# Patient Record
Sex: Female | Born: 2000 | Race: White | Hispanic: Yes | Smoking: Never smoker
Health system: Southern US, Community
[De-identification: ages and names within clinical notes are randomized; demographics above are authoritative.]

## PROBLEM LIST (undated history)

## (undated) ENCOUNTER — Inpatient Hospital Stay (HOSPITAL_COMMUNITY): Payer: Self-pay

## (undated) DIAGNOSIS — O26619 Liver and biliary tract disorders in pregnancy, unspecified trimester: Secondary | ICD-10-CM

## (undated) DIAGNOSIS — G43909 Migraine, unspecified, not intractable, without status migrainosus: Secondary | ICD-10-CM

## (undated) DIAGNOSIS — K831 Obstruction of bile duct: Secondary | ICD-10-CM

## (undated) DIAGNOSIS — Z789 Other specified health status: Secondary | ICD-10-CM

## (undated) DIAGNOSIS — O26649 Intrahepatic cholestasis of pregnancy, unspecified trimester: Secondary | ICD-10-CM

## (undated) DIAGNOSIS — N390 Urinary tract infection, site not specified: Secondary | ICD-10-CM

## (undated) HISTORY — PX: WISDOM TOOTH EXTRACTION: SHX21

## (undated) HISTORY — DX: Obstruction of bile duct: K83.1

## (undated) HISTORY — PX: NO PAST SURGERIES: SHX2092

## (undated) HISTORY — DX: Intrahepatic cholestasis of pregnancy, unspecified trimester: O26.649

## (undated) HISTORY — DX: Obstruction of bile duct: O26.619

---
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
## 2021-05-16 ENCOUNTER — Inpatient Hospital Stay (HOSPITAL_COMMUNITY)
Admission: AD | Admit: 2021-05-16 | Discharge: 2021-05-16 | Disposition: A | Discharge status: Home / Self Care | Payer: Medicaid Other | Attending: Obstetrics and Gynecology | Admitting: Obstetrics and Gynecology

## 2021-05-16 ENCOUNTER — Inpatient Hospital Stay (HOSPITAL_COMMUNITY): Payer: Medicaid Other

## 2021-05-16 ENCOUNTER — Other Ambulatory Visit: Payer: Self-pay

## 2021-05-16 ENCOUNTER — Encounter (HOSPITAL_COMMUNITY): Payer: Self-pay | Admitting: Obstetrics and Gynecology

## 2021-05-16 DIAGNOSIS — R109 Unspecified abdominal pain: Secondary | ICD-10-CM | POA: Diagnosis not present

## 2021-05-16 DIAGNOSIS — O26899 Other specified pregnancy related conditions, unspecified trimester: Secondary | ICD-10-CM | POA: Diagnosis not present

## 2021-05-16 DIAGNOSIS — R1012 Left upper quadrant pain: Secondary | ICD-10-CM | POA: Diagnosis not present

## 2021-05-16 DIAGNOSIS — Z3A01 Less than 8 weeks gestation of pregnancy: Secondary | ICD-10-CM | POA: Insufficient documentation

## 2021-05-16 DIAGNOSIS — O26891 Other specified pregnancy related conditions, first trimester: Secondary | ICD-10-CM | POA: Insufficient documentation

## 2021-05-16 DIAGNOSIS — R1011 Right upper quadrant pain: Secondary | ICD-10-CM | POA: Insufficient documentation

## 2021-05-16 DIAGNOSIS — N898 Other specified noninflammatory disorders of vagina: Secondary | ICD-10-CM | POA: Insufficient documentation

## 2021-05-16 LAB — CBC WITH DIFFERENTIAL/PLATELET
Abs Immature Granulocytes: 0.03 10*3/uL (ref 0.00–0.07)
Basophils Absolute: 0 10*3/uL (ref 0.0–0.1)
Basophils Relative: 0 %
Eosinophils Absolute: 0.1 10*3/uL (ref 0.0–0.5)
Eosinophils Relative: 1 %
HCT: 34 % — ABNORMAL LOW (ref 36.0–46.0)
Hemoglobin: 11.5 g/dL — ABNORMAL LOW (ref 12.0–15.0)
Immature Granulocytes: 0 %
Lymphocytes Relative: 24 %
Lymphs Abs: 2.2 10*3/uL (ref 0.7–4.0)
MCH: 30.2 pg (ref 26.0–34.0)
MCHC: 33.8 g/dL (ref 30.0–36.0)
MCV: 89.2 fL (ref 80.0–100.0)
Monocytes Absolute: 0.7 10*3/uL (ref 0.1–1.0)
Monocytes Relative: 7 %
Neutro Abs: 6.3 10*3/uL (ref 1.7–7.7)
Neutrophils Relative %: 68 %
Platelets: 254 10*3/uL (ref 150–400)
RBC: 3.81 MIL/uL — ABNORMAL LOW (ref 3.87–5.11)
RDW: 12.9 % (ref 11.5–15.5)
WBC: 9.3 10*3/uL (ref 4.0–10.5)
nRBC: 0 % (ref 0.0–0.2)

## 2021-05-16 LAB — URINALYSIS, ROUTINE W REFLEX MICROSCOPIC
Bilirubin Urine: NEGATIVE
Glucose, UA: NEGATIVE mg/dL
Hgb urine dipstick: NEGATIVE
Ketones, ur: NEGATIVE mg/dL
Leukocytes,Ua: NEGATIVE
Nitrite: NEGATIVE
Protein, ur: NEGATIVE mg/dL
Specific Gravity, Urine: 1.015 (ref 1.005–1.030)
pH: 7 (ref 5.0–8.0)

## 2021-05-16 LAB — ABO/RH: ABO/RH(D): O POS

## 2021-05-16 LAB — HCG, QUANTITATIVE, PREGNANCY: hCG, Beta Chain, Quant, S: 102148 m[IU]/mL — ABNORMAL HIGH (ref <5)

## 2021-05-16 LAB — POCT PREGNANCY, URINE: Preg Test, Ur: POSITIVE — AB

## 2021-05-16 LAB — WET PREP, GENITAL
Clue Cells Wet Prep HPF POC: NONE SEEN
Sperm: NONE SEEN
Trich, Wet Prep: NONE SEEN
WBC, Wet Prep HPF POC: <10 (ref <10)
Yeast Wet Prep HPF POC: NONE SEEN

## 2021-05-16 MED ORDER — SIMETHICONE 80 MG PO CHEW: 80.0000 mg | CHEWABLE_TABLET | Freq: Four times a day (QID) | ORAL | 0 refills | Status: DC | PRN

## 2021-05-16 MED ORDER — DOXYLAMINE-PYRIDOXINE 10-10 MG PO TBEC: DELAYED_RELEASE_TABLET | ORAL | 0 refills | Status: DC

## 2021-05-16 MED ORDER — POLYETHYLENE GLYCOL 3350 17 GM/SCOOP PO POWD: 17.0000 g | Freq: Every day | ORAL | 0 refills | Status: DC

## 2021-05-16 NOTE — MAU Note (Signed)
Molly Rose is a 20 y.o. at [redacted]w[redacted]d here in MAU reporting: has been having abdominal pain this week. Currently pain is lower abdomen. No bleeding. Having some orange discharge, no odor or itching  LMP: 03/29/2021  Onset of complaint: ongoing  Pain score: 5/10  Vitals:   05/16/21 1851  BP: (!) 122/54  Pulse: 70  Resp: 16  Temp: 98.4 F (36.9 C)  SpO2: 100%     Lab orders placed from triage: ua, upt

## 2021-05-16 NOTE — Discharge Instructions (Addendum)
Please return to the MAU if you have severe abdominal pain or vaginal bleeding requiring pads.   Make sure you establish care with your ob/gyn.   Start miralax daily with goal of soft stool everyday or every other day. You can do this for a few weeks and discontinue when able. I have also sent a gas pill to help.   Please drink plenty of fluids.   Safe Medications in Pregnancy   Acne: Benzoyl Peroxide Salicylic Acid  Backache/Headache: Tylenol: 2 regular strength every 4 hours OR              2 Extra strength every 6 hours  Colds/Coughs/Allergies: Benadryl (alcohol free) 25 mg every 6 hours as needed Breath right strips Claritin Cepacol throat lozenges Chloraseptic throat spray Cold-Eeze- up to three times per day Cough drops, alcohol free Flonase (by prescription only) Guaifenesin Mucinex Robitussin DM (plain only, alcohol free) Saline nasal spray/drops Sudafed (pseudoephedrine) & Actifed ** use only after [redacted] weeks gestation and if you do not have high blood pressure Tylenol Vicks Vaporub Zinc lozenges Zyrtec   Constipation: Colace Ducolax suppositories Fleet enema Glycerin suppositories Metamucil Milk of magnesia Miralax Senokot Smooth move tea  Indigestion: Tums Maalox Mylanta Zantac  Pepcid  Nausea/Vomiting:  Ginger extract Sea bands Meclizine  Nausea medication to take during pregnancy:  Unisom (doxylamine succinate 25 mg tablets) Take one tablet daily at bedtime. If symptoms are not adequately controlled, the dose can be increased to a maximum recommended dose of two tablets daily (1/2 tablet in the morning, 1/2 tablet mid-afternoon and one at bedtime). Vitamin B6 100mg  tablets. Take one tablet twice a day (up to 200 mg per day).  **If taking multiple medications, please check labels to avoid duplicating the same active ingredients **take medication as directed on the label ** Do not exceed 4000 mg of tylenol in 24 hours **Do not take  medications that contain aspirin or ibuprofen

## 2021-05-16 NOTE — MAU Provider Note (Signed)
History     CSN: [id number]  Arrival date and time: 05/16/21 1826   Event Date/Time   First Provider Initiated Contact with Patient 05/16/21 1933      Chief Complaint  Patient presents with   Abdominal Pain   Vaginal Discharge   Molly Rose is a 20 year old G1P0 at [redacted]w[redacted]d by LMP presenting for evaluation of intermittent abdominal pain and increase in vaginal discharge.  She reports an intermittent, cramping like abdominal pain that has been present in different locations (has been in her RUQ, LUQ, suprapubic etc) of her abdomen for the past week.  Usually she is able to turn onto her side and the pain resolves or will resolve spontaneously after a few hours, however was bothering her more than usual today.  She denies any associated fever, vaginal bleeding, dysuria, flank pain, or lightheadedness/dizziness.  She does report general nausea pregnancy, no emesis.  She is eating as normal, not drinking very many fluids.  Reports that she does feel constipated, having harder stools than previous.  Has noticed an increase in vaginal discharge over the past week, feels like it has been clear but more white/orange in color today.  Denies any itching or irritation.  Denies concern for STDs.  No current abdominal pain.   History reviewed. No pertinent past medical history.  History reviewed. No pertinent surgical history.  History reviewed. No pertinent family history.  Social History   Tobacco Use   Smoking status: Never   Smokeless tobacco: Never  Vaping Use   Vaping Use: Never used  Substance Use Topics   Alcohol use: No    Alcohol/week: 0.0 standard drinks   Drug use: No    Allergies:  Allergies  Allergen Reactions   Other     Seasonal Allergies    Medications Prior to Admission  Medication Sig Dispense Refill Last Dose   Prenatal Vit-Fe Fumarate-FA (PRENATAL MULTIVITAMIN) TABS tablet Take 1 tablet by mouth daily at 12 noon.   05/16/2021   cetirizine (ZYRTEC) 10 MG tablet  Take 10 mg by mouth daily.      metronidazole (FLAGYL ER) 750 MG 24 hr tablet Take 750 mg by mouth daily.       Review of Systems  Constitutional:  Negative for chills, fatigue and fever.  HENT:  Negative for congestion.   Respiratory:  Negative for chest tightness and shortness of breath.   Cardiovascular:  Negative for chest pain.  Gastrointestinal:  Positive for nausea. Negative for abdominal distention, diarrhea and vomiting.  Genitourinary:  Positive for vaginal discharge. Negative for decreased urine volume, dysuria, hematuria, pelvic pain, urgency, vaginal bleeding and vaginal pain.  Musculoskeletal:  Negative for back pain.  Skin:  Negative for pallor.  Neurological:  Negative for dizziness and light-headedness.  Physical Exam   Blood pressure 115/61, pulse 77, temperature 98.4 F (36.9 C), temperature source Oral, resp. rate 16, height 5' (1.524 m), weight 53.8 kg, last menstrual period 03/29/2021, SpO2 100 %.  Physical Exam Constitutional:      General: She is not in acute distress.    Appearance: She is well-developed. She is not ill-appearing or toxic-appearing.  HENT:     Head: Normocephalic and atraumatic.     Mouth/Throat:     Mouth: Mucous membranes are moist.  Eyes:     Extraocular Movements: Extraocular movements intact.  Cardiovascular:     Rate and Rhythm: Normal rate.  Pulmonary:     Effort: Pulmonary effort is normal.  Abdominal:     Comments:  Soft, nontender in all 4 quadrants with deep palpation, nondistended.  No masses palpated.  Genitourinary:    Comments: Self swabbed gc/ch and wet prep  Skin:    General: Skin is warm.     Capillary Refill: Capillary refill takes less than 2 seconds.  Neurological:     Mental Status: She is alert and oriented to person, place, and time.  Psychiatric:        Mood and Affect: Mood normal.        Behavior: Behavior normal.    MAU Course   MDM Abdominal cramping in first trimester:  UA negative  CBC,  HCG ABO/Rh > O positive blood type  Wet prep and gc/chlamydia  US OB Comp Less 14 weeks with Transvaginal   Assessment and Plan   1. Abdominal cramping affecting pregnancy U/S showing single viable IUP at 6 weeks and 5 days, consistent with LMP.  Benign abdomen, suspect intermittent pains may be related to gas/constipation.  U/a without evidence of UTI.  Rx for simethicone and MiraLAX to achieve a soft stool every day/every other day.  Encouraged adequate hydration.  2. [redacted] weeks gestation of pregnancy Already has new OB appointment scheduled in 05/2021.  Continue prenatal vitamin.  3. Vaginal discharge Wet prep negative.  Follow-up GC/CH.  May be physiologic.  MAU precautions discussed including severe abdominal pain, vaginal bleeding.  Follow-up if recurrent pain/worsening.  Patriciaann Clan 05/16/2021, 7:33 PM

## 2021-05-17 LAB — GC/CHLAMYDIA PROBE AMP (~~LOC~~) NOT AT ARMC
Chlamydia: NEGATIVE
Comment: NEGATIVE
Comment: NORMAL
Neisseria Gonorrhea: NEGATIVE

## 2021-05-22 ENCOUNTER — Telehealth: Payer: Medicaid Other | Admitting: Nurse Practitioner

## 2021-05-22 ENCOUNTER — Encounter: Payer: Self-pay | Admitting: Nurse Practitioner

## 2021-05-22 DIAGNOSIS — O219 Vomiting of pregnancy, unspecified: Secondary | ICD-10-CM

## 2021-05-22 NOTE — Progress Notes (Signed)
Virtual Visit Consent   Molly Rose, you are scheduled for a virtual visit with a [facility] provider today.     Just as with appointments in the office, your consent must be obtained to participate.  Your consent will be active for this visit and any virtual visit you may have with one of our providers in the next 365 days.     If you have a MyChart account, a copy of this consent can be sent to you electronically.  All virtual visits are billed to your insurance company just like a traditional visit in the office.    As this is a virtual visit, video technology does not allow for your provider to perform a traditional examination.  This may limit your provider's ability to fully assess your condition.  If your provider identifies any concerns that need to be evaluated in person or the need to arrange testing (such as labs, EKG, etc.), we will make arrangements to do so.     Although advances in technology are sophisticated, we cannot ensure that it will always work on either your end or our end.  If the connection with a video visit is poor, the visit may have to be switched to a telephone visit.  With either a video or telephone visit, we are not always able to ensure that we have a secure connection.     I need to obtain your verbal consent now.   Are you willing to proceed with your visit today?    Molly Rose has provided verbal consent on 05/22/2021 for a virtual visit (video or telephone).   Molly Simas, Molly Rose   Date: 05/22/2021 12:58 PM   Virtual Visit via Video Note   I, Molly Rose, connected with  Molly Rose  ([id number], 20-28-2002) on 05/22/21 at  1:00 PM EST by a video-enabled telemedicine application and verified that I am speaking with the correct person using two identifiers.  Location: Patient: Virtual Visit Location Patient: Home Provider: Virtual Visit Location Provider: Home Office   I discussed the limitations of evaluation and management by  telemedicine and the availability of in person appointments. The patient expressed understanding and agreed to proceed.    History of Present Illness: Molly Rose is a 20 y.o. who identifies as a female who was assigned female at birth, and is being seen today with questions about medications she can take while pregnant.   She is 7-[redacted] weeks pregnant. She was evaluated by the [facility] last week and was told that the pain she was experiencing was from gas.   She was wondering if she can take tylenol while pregnant.   She also called out of work today due to gas pains.   The pain she is having today is similar to when she was seen in the office and is accompanied with gas and nausea and burping and has relief when she belches.   The pain and nausea is associated with eating.   This is her first pregnancy.   She has also been taking prenatal vitamins on an empty stomach   Problems:  Patient Active Problem List   Diagnosis Date Noted   Migraine without aura and without status migrainosus, not intractable 09/18/2015   Complicated migraine 09/18/2015   Episodic tension-type headache 09/18/2015    Allergies:  Allergies  Allergen Reactions   Other     Seasonal Allergies   Medications:  Current Outpatient Medications:  cetirizine (ZYRTEC) 10 MG tablet, Take 10 mg by mouth daily., Disp: , Rfl:    Doxylamine-Pyridoxine (DICLEGIS) 10-10 MG TBEC, Take 2 tabs at bedtime. If needed, add another tab in the morning. If needed, add another tab in the afternoon, up to 4 tabs/day., Disp: 30 tablet, Rfl: 0   polyethylene glycol powder (GLYCOLAX/MIRALAX) 17 GM/SCOOP powder, Take 17 g by mouth daily. Can increase to twice daily if needed for soft stool daily/every other day., Disp: 500 g, Rfl: 0   Prenatal Vit-Fe Fumarate-FA (PRENATAL MULTIVITAMIN) TABS tablet, Take 1 tablet by mouth daily at 12 noon., Disp: , Rfl:    simethicone (MYLICON) 80 MG chewable tablet, Chew 1 tablet (80  mg total) by mouth every 6 (six) hours as needed for flatulence., Disp: 20 tablet, Rfl: 0  Observations/Objective: Patient is well-developed, well-nourished in no acute distress.  Resting comfortably at home.  Head is normocephalic, atraumatic.  No labored breathing.  Speech is clear and coherent with logical content.  Patient is alert and oriented at baseline.    Assessment and Plan: 1. Nausea/vomiting in pregnancy  Will provide work note for today Follow up with OBGYN for discussion on prescription medications for nausea as needed.   Discussed bland diet, eating frequent snacks and emergency reasons to seek medical treatment for acute abdominal pain or vaginal bleeding   Follow the recommendations from your OBGYN on over the counter medications during pregnancy   Follow Up Instructions: I discussed the assessment and treatment plan with the patient. The patient was provided an opportunity to ask questions and all were answered. The patient agreed with the plan and demonstrated an understanding of the instructions.  A copy of instructions were sent to the patient via MyChart unless otherwise noted below.    The patient was advised to call back or seek an in-person evaluation if the symptoms worsen or if the condition fails to improve as anticipated.  Time:  I spent 10 minutes with the patient via telehealth technology discussing the above problems/concerns.    Molly Simas, Molly Rose

## 2021-05-23 ENCOUNTER — Other Ambulatory Visit: Payer: Self-pay

## 2021-05-23 ENCOUNTER — Inpatient Hospital Stay (HOSPITAL_COMMUNITY): Payer: Medicaid Other

## 2021-05-23 ENCOUNTER — Telehealth: Payer: Medicaid Other | Admitting: Physician Assistant

## 2021-05-23 ENCOUNTER — Encounter (HOSPITAL_COMMUNITY): Payer: Self-pay | Admitting: Obstetrics & Gynecology

## 2021-05-23 ENCOUNTER — Inpatient Hospital Stay (HOSPITAL_COMMUNITY)
Admission: AD | Admit: 2021-05-23 | Discharge: 2021-05-23 | Disposition: A | Discharge status: Home / Self Care | Payer: Medicaid Other | Attending: Obstetrics & Gynecology | Admitting: Obstetrics & Gynecology

## 2021-05-23 DIAGNOSIS — O209 Hemorrhage in early pregnancy, unspecified: Secondary | ICD-10-CM

## 2021-05-23 DIAGNOSIS — K5900 Constipation, unspecified: Secondary | ICD-10-CM

## 2021-05-23 DIAGNOSIS — O99611 Diseases of the digestive system complicating pregnancy, first trimester: Secondary | ICD-10-CM | POA: Diagnosis not present

## 2021-05-23 DIAGNOSIS — K625 Hemorrhage of anus and rectum: Secondary | ICD-10-CM | POA: Diagnosis not present

## 2021-05-23 DIAGNOSIS — Z3A01 Less than 8 weeks gestation of pregnancy: Secondary | ICD-10-CM | POA: Diagnosis not present

## 2021-05-23 HISTORY — DX: Other specified health status: Z78.9

## 2021-05-23 NOTE — Progress Notes (Signed)
[name],you are scheduled for a virtual visit with your provider today.    Just as we do with appointments in the office, we must obtain your consent to participate.  Your consent will be active for this visit and any virtual visit you may have with one of our providers in the next 365 days.    If you have a MyChart account, I can also send a copy of this consent to you electronically.  All virtual visits are billed to your insurance company just like a traditional visit in the office.  As this is a virtual visit, video technology does not allow for your provider to perform a traditional examination.  This may limit your provider's ability to fully assess your condition.  If your provider identifies any concerns that need to be evaluated in person or the need to arrange testing such as labs, EKG, etc, we will make arrangements to do so.    Although advances in technology are sophisticated, we cannot ensure that it will always work on either your end or our end.  If the connection with a video visit is poor, we may have to switch to a telephone visit.  With either a video or telephone visit, we are not always able to ensure that we have a secure connection.   I need to obtain your verbal consent now.   Are you willing to proceed with your visit today?   Ralph J Ferne Coe has provided verbal consent on 05/23/2021 for a virtual visit (video or telephone).   Dierdre Forth, PA-C 05/23/2021  5:47 PM   Date:  05/23/2021   ID:  Molly Rose, DOB January 03, 2001, MRN [id number]  Patient Location: Home Provider Location: Home Office   Participants: Patient and Provider for Visit and Wrap up  Method of visit: Video  Location of Patient: Home Location of Provider: Home Office Consent was obtain for visit over the video. Services rendered by provider: Visit was performed via video  A video enabled telemedicine application was used and I verified that I am speaking with the correct  person using two identifiers.  PCP:  Samantha Crimes, MD   Chief Complaint:  vaginal bleeding  History of Present Illness:    Molly Rose is a 20 y.o. female with history as stated below. Presents video telehealth for an acute care visit.  Pt reports vaginal bleeding onset around 7-8pm after a bowel movement.  Pt reports no additional bleeding last night.  She reports additional bleeding after a bowel movement this afternoon.  Pt reports no abdominal pain.    Pt reports she hasn't had much to eat today and is generally feeling better, but is concerned about the bleeding.   Pt reports the blood in coming from her vagina.  She does report some constipation and straining to have a BM, but no pain with BM.  Pt reports she is [redacted] weeks pregnant.  Pt was seen at the MAU but has not yet established with an OB/GYN.   Past Medical, Surgical, Social History, Allergies, and Medications have been Reviewed.  Patient Active Problem List   Diagnosis Date Noted   Migraine without aura and without status migrainosus, not intractable 09/18/2015   Complicated migraine 09/18/2015   Episodic tension-type headache 09/18/2015    Social History   Tobacco Use   Smoking status: Never   Smokeless tobacco: Never  Substance Use Topics   Alcohol use: No    Alcohol/week: 0.0 standard  drinks     Current Outpatient Medications:    cetirizine (ZYRTEC) 10 MG tablet, Take 10 mg by mouth daily., Disp: , Rfl:    Doxylamine-Pyridoxine (DICLEGIS) 10-10 MG TBEC, Take 2 tabs at bedtime. If needed, add another tab in the morning. If needed, add another tab in the afternoon, up to 4 tabs/day., Disp: 30 tablet, Rfl: 0   polyethylene glycol powder (GLYCOLAX/MIRALAX) 17 GM/SCOOP powder, Take 17 g by mouth daily. Can increase to twice daily if needed for soft stool daily/every other day., Disp: 500 g, Rfl: 0   Prenatal Vit-Fe Fumarate-FA (PRENATAL MULTIVITAMIN) TABS tablet, Take 1 tablet by mouth daily at 12  noon., Disp: , Rfl:    simethicone (MYLICON) 80 MG chewable tablet, Chew 1 tablet (80 mg total) by mouth every 6 (six) hours as needed for flatulence., Disp: 20 tablet, Rfl: 0   Allergies  Allergen Reactions   Other     Seasonal Allergies     Review of Systems  Constitutional:  Negative for chills and fever.  HENT:  Negative for congestion, ear pain and sore throat.   Eyes:  Negative for blurred vision and double vision.  Respiratory:  Negative for cough, shortness of breath and wheezing.   Cardiovascular:  Negative for chest pain, palpitations and leg swelling.  Gastrointestinal:  Negative for abdominal pain, diarrhea, nausea and vomiting.  Genitourinary:  Negative for dysuria.       Vaginal bleeding in pregnancy  Musculoskeletal:  Negative for myalgias.  Skin:  Negative for rash.  Neurological:  Negative for loss of consciousness, weakness and headaches.  Psychiatric/Behavioral:  The patient is not nervous/anxious.   See HPI for history of present illness.  Physical Exam Constitutional:      General: She is not in acute distress.    Appearance: Normal appearance. She is not ill-appearing.  HENT:     Head: Normocephalic and atraumatic.     Nose: No congestion.  Eyes:     Extraocular Movements: Extraocular movements intact.  Pulmonary:     Effort: Pulmonary effort is normal.  Musculoskeletal:        General: Normal range of motion.     Cervical back: Normal range of motion.  Skin:    Coloration: Skin is not pale.  Neurological:     General: No focal deficit present.     Mental Status: She is alert. Mental status is at baseline.  Psychiatric:        Mood and Affect: Mood normal.              A&P  1. Vaginal bleeding in pregnancy, first trimester  - unclear if bleeding is coming from your rectum due to a hemorrhoid or from your vagina.  You will need a physical exam to determine the source of the blood and treatment.  - please go immediately to the MAU at [facility].   Patient voiced understanding and agreement to plan.   Time:   Today, I have spent 15 minutes with the patient with telehealth technology discussing the above problems, reviewing the chart, previous notes, medications and orders.    Tests Ordered: No orders of the defined types were placed in this encounter.   Medication Changes: No orders of the defined types were placed in this encounter.    Disposition:  Follow up at the MAU tonight  Signed, Dierdre Forth, PA-C  05/23/2021 5:47 PM

## 2021-05-23 NOTE — MAU Note (Signed)
Since 1900 last night I had a BM and saw some bleeding. At 12n today I had a BM and had more bleeding than last night. No more bleeding since then. No hx of hemorrhoids but BMs are hard. No pain

## 2021-05-23 NOTE — Progress Notes (Signed)
Written and verbal d/c instructions given and understanding voiced. 

## 2021-05-23 NOTE — Patient Instructions (Signed)
1. Vaginal bleeding in pregnancy, first trimester  - unclear if bleeding is coming from your rectum due to a hemorrhoid or from your vagina.  You will need a physical exam to determine the source of the blood and treatment.  - please go immediately to the MAU at [facility].

## 2021-05-23 NOTE — Discharge Instructions (Signed)

## 2021-05-23 NOTE — MAU Provider Note (Signed)
History     CSN: [id number]  Arrival date and time: 05/23/21 2683   None     Chief Complaint  Patient presents with   bleeding from vagina or rectum   HPI Molly Rose is a 20 y.o. G1P0 at [redacted]w[redacted]d by LMP who presents to MAU for rectal vs vaginal bleeding. Patient reports she had a BM last night around 7pm and noticed a lot of bright red blood when she wiped. She reports she did not have any more bleeding until she had another BM around 12pm today where she had more bright red bleeding. She reports that she put a pad on and has not had any more bleeding since. She is unsure if this is rectal bleeding or vaginal bleeding. She reports that she has had constipation even prior to pregnancy, however pregnancy has worsened it. She reports that she often feels like her stools are hard and that she has to strain a bit. Was recently given a prescription for Miralax however has not yet picked it up. She denies pain, urinary s/s, fever, or vomiting.  OB History     Gravida  1   Para      Term      Preterm      AB      Living         SAB      IAB      Ectopic      Multiple      Live Births              Past Medical History:  Diagnosis Date   Medical history non-contributory     Past Surgical History:  Procedure Laterality Date   NO PAST SURGERIES      History reviewed. No pertinent family history.  Social History   Tobacco Use   Smoking status: Never   Smokeless tobacco: Never  Vaping Use   Vaping Use: Never used  Substance Use Topics   Alcohol use: No    Alcohol/week: 0.0 standard drinks   Drug use: No    Allergies:  Allergies  Allergen Reactions   Other     Seasonal Allergies    Medications Prior to Admission  Medication Sig Dispense Refill Last Dose   Doxylamine-Pyridoxine (DICLEGIS) 10-10 MG TBEC Take 2 tabs at bedtime. If needed, add another tab in the morning. If needed, add another tab in the afternoon, up to 4 tabs/day. 30 tablet 0  05/22/2021   Prenatal Vit-Fe Fumarate-FA (PRENATAL MULTIVITAMIN) TABS tablet Take 1 tablet by mouth daily at 12 noon.   05/22/2021   cetirizine (ZYRTEC) 10 MG tablet Take 10 mg by mouth daily.      polyethylene glycol powder (GLYCOLAX/MIRALAX) 17 GM/SCOOP powder Take 17 g by mouth daily. Can increase to twice daily if needed for soft stool daily/every other day. 500 g 0    simethicone (MYLICON) 80 MG chewable tablet Chew 1 tablet (80 mg total) by mouth every 6 (six) hours as needed for flatulence. 20 tablet 0     Review of Systems  Constitutional: Negative.   Respiratory: Negative.    Cardiovascular: Negative.   Gastrointestinal:  Positive for anal bleeding, constipation and nausea. Negative for vomiting.  Genitourinary:  Positive for vaginal bleeding. Negative for dysuria, pelvic pain and vaginal discharge.  Musculoskeletal: Negative.   Neurological: Negative.   Physical Exam   Blood pressure (!) 102/58, pulse 69, temperature 98.4 F (36.9 C), resp. rate 16, height 5' (1.524 m),  weight 53.1 kg, last menstrual period 03/29/2021, SpO2 99 %.  Physical Exam Vitals and nursing note reviewed. Exam conducted with a chaperone present.  Constitutional:      General: She is not in acute distress.    Appearance: She is normal weight.  Eyes:     Extraocular Movements: Extraocular movements intact.     Conjunctiva/sclera: Conjunctivae normal.     Pupils: Pupils are equal, round, and reactive to light.  Cardiovascular:     Rate and Rhythm: Normal rate.  Pulmonary:     Effort: Pulmonary effort is normal.  Abdominal:     Palpations: Abdomen is soft.     Tenderness: There is no abdominal tenderness.  Genitourinary:    Rectum: No anal fissure, external hemorrhoid or internal hemorrhoid.     Comments: NEFG, vaginal walls pink with rugae, small amount of brown discharge, no bright red bleeding, cervix without lesions/masses, appears visually closed/thick Musculoskeletal:        General: Normal  range of motion.     Cervical back: Normal range of motion.  Skin:    General: Skin is warm and dry.  Neurological:     General: No focal deficit present.     Mental Status: She is alert and oriented to person, place, and time.  Psychiatric:        Mood and Affect: Mood normal.        Behavior: Behavior normal.        Thought Content: Thought content normal.        Judgment: Judgment normal.   US OB Comp Less 14 Wks  Result Date: 05/23/2021 CLINICAL DATA:  Vaginal bleeding. LMP: 03/29/2021 corresponding to an estimated gestational age of [redacted] weeks, 6 days. EXAM: OBSTETRIC <14 WK ULTRASOUND TECHNIQUE: Transabdominal ultrasound was performed for evaluation of the gestation as well as the maternal uterus and adnexal regions. COMPARISON:  None. FINDINGS: Intrauterine gestational sac: Single intrauterine gestational sac. Yolk sac:  Seen Embryo:  Present Cardiac Activity: Detected Heart Rate: 166 bpm CRL:   16 mm   7 w 6 d                  [place] EDC: 01/03/2022 Subchorionic hemorrhage:  None visualized. Maternal uterus/adnexae: The maternal ovaries are unremarkable. IMPRESSION: Single live intrauterine pregnancy with an estimated gestational age of [redacted] weeks, 6 days concordant with age based on LMP. Electronically Signed   By: Elgie Collard M.D.   On: 05/23/2021 20:39    MAU Course  Procedures [place]  MDM Speculum exam shows small amount of brown discharge, no active or bright red bleeding; cervix closed/thick. Rectal exam without hemorrhoids or fissures. US shows IUP with FHR. No subchorionic hemorrhage. I suspect bleeding is likely rectal related to constipation and straining.   Assessment and Plan  [redacted] weeks gestation of pregnancy Constipation Rectal bleeding   - Discharge home in stable condition - Pick up Miralax rx. May use stool softener daily. I recommend increasing fiber intake either through diet or with fiber supplement along with increasing water intake. Avoid straining during BM's. -  Keep OB appointment as scheduled - Return to MAU as needed or for worsening symptoms   Brand Males, CNM 05/23/2021, 9:07 PM

## 2021-05-27 NOTE — L&D Delivery Note (Addendum)
Delivery Note Called to imminent delivery while other CNM was in a delivery.  When I entered room, head was crowning almost to ears.  FHR in 130s with good variability.  After pushing for appoximately 3.5 hours, at 4:58 AM a viable and healthy female was delivered via Vaginal, Spontaneous (Presentation:LOA).  APGAR: 8, 9; weight pending.  Shoulders tight, grasped anterior axilla and delivered easily.  Infant cried spontaneously.  There was a gush of blood, approximately with delivery of baby  Care turned over to [facility] CNM for completion of third stage.  Wynelle Bourgeois 12/21/2021, 5:09 AM  Cord clamped and cut by FOB.  Pitocin bolus started, placenta delivered via CCT, appears to be intact w/3VC.  Bleeding minimal at this point.  A 1st degree left vaginal laceration was bleeding, so repaired w/3-0 vicryl.  A 1st degree right periurethral lac was also repaired w/a 3-0 monocryl.    Epidural anesthesia EBL 185cc  Mom to postpartum, baby to couplet care w/mom

## 2021-06-04 ENCOUNTER — Telehealth: Payer: Medicaid Other | Admitting: Family

## 2021-06-04 DIAGNOSIS — K5900 Constipation, unspecified: Secondary | ICD-10-CM

## 2021-06-04 MED ORDER — MILK OF MAGNESIA 7.75 % PO SUSP: 15.0000 mL | Freq: Every day | ORAL | 0 refills | Status: DC | PRN

## 2021-06-04 NOTE — Patient Instructions (Signed)

## 2021-06-04 NOTE — Progress Notes (Signed)
Virtual Visit Consent   Molly Rose, you are scheduled for a virtual visit with a [facility] provider today.     Just as with appointments in the office, your consent must be obtained to participate.  Your consent will be active for this visit and any virtual visit you may have with one of our providers in the next 365 days.     If you have a MyChart account, a copy of this consent can be sent to you electronically.  All virtual visits are billed to your insurance company just like a traditional visit in the office.    As this is a virtual visit, video technology does not allow for your provider to perform a traditional examination.  This may limit your provider's ability to fully assess your condition.  If your provider identifies any concerns that need to be evaluated in person or the need to arrange testing (such as labs, EKG, etc.), we will make arrangements to do so.     Although advances in technology are sophisticated, we cannot ensure that it will always work on either your end or our end.  If the connection with a video visit is poor, the visit may have to be switched to a telephone visit.  With either a video or telephone visit, we are not always able to ensure that we have a secure connection.     I need to obtain your verbal consent now.   Are you willing to proceed with your visit today?    [place] has provided verbal consent on 06/04/2021 for a virtual visit (video or telephone).   Evelina Dun, FNP   Date: 06/04/2021 6:40 PM   Virtual Visit via Video Note   I, Evelina Dun, connected with  Molly Rose  ([id number], February 04, 2001) on 06/04/21 at  6:30 PM EST by a video-enabled telemedicine application and verified that I am speaking with the correct person using two identifiers.  Location: Patient: Virtual Visit Location Patient: Home Provider: Virtual Visit Location Provider: Home Office   I discussed the limitations of evaluation and management by  telemedicine and the availability of in person appointments. The patient expressed understanding and agreed to proceed.    History of Present Illness: Molly Rose is a 21 y.o. who identifies as a female who was assigned female at birth, and is being seen today for constipation. She is [redacted] weeks pregnant. She reports she always has had constipation, but since starting her prenatal vitamins it has worsen.   HPI: Constipation This is a chronic problem. The current episode started more than 1 month ago. The problem has been gradually worsening since onset. Her stool frequency is 2 to 3 times per week. The stool is described as firm. Associated symptoms include bloating and flatus. Pertinent negatives include no diarrhea. She has tried diet changes and laxatives for the symptoms. The treatment provided mild relief.   Problems:  Patient Active Problem List   Diagnosis Date Noted   Migraine without aura and without status migrainosus, not intractable 0000000   Complicated migraine 0000000   Episodic tension-type headache 09/18/2015    Allergies:  Allergies  Allergen Reactions   Other     Seasonal Allergies   Medications:  Current Outpatient Medications:    magnesium hydroxide (MILK OF MAGNESIA) 400 MG/5ML suspension, Take 15 mLs by mouth daily as needed for mild constipation., Disp: 355 mL, Rfl: 0   Doxylamine-Pyridoxine (DICLEGIS) 10-10 MG TBEC, Take  2 tabs at bedtime. If needed, add another tab in the morning. If needed, add another tab in the afternoon, up to 4 tabs/day., Disp: 30 tablet, Rfl: 0   polyethylene glycol powder (GLYCOLAX/MIRALAX) 17 GM/SCOOP powder, Take 17 g by mouth daily. Can increase to twice daily if needed for soft stool daily/every other day., Disp: 500 g, Rfl: 0   Prenatal Vit-Fe Fumarate-FA (PRENATAL MULTIVITAMIN) TABS tablet, Take 1 tablet by mouth daily at 12 noon., Disp: , Rfl:   Observations/Objective: Patient is well-developed, well-nourished in no acute  distress.  Resting comfortably at home.  Head is normocephalic, atraumatic.  No labored breathing. Speech is clear and coherent with logical content.  Patient is alert and oriented at baseline.    Assessment and Plan: 1. Constipation, unspecified constipation type - magnesium hydroxide (MILK OF MAGNESIA) 400 MG/5ML suspension; Take 15 mLs by mouth daily as needed for mild constipation.  Dispense: 355 mL; Refill: 0  Force fluids Increase fiber in diet Encourage exercise  Continue prenatal vitamins  Follow up with GYN as needed  Follow Up Instructions: I discussed the assessment and treatment plan with the patient. The patient was provided an opportunity to ask questions and all were answered. The patient agreed with the plan and demonstrated an understanding of the instructions.  A copy of instructions were sent to the patient via MyChart unless otherwise noted below.     The patient was advised to call back or seek an in-person evaluation if the symptoms worsen or if the condition fails to improve as anticipated.  Time:  I spent 12 minutes with the patient via telehealth technology discussing the above problems/concerns.    Evelina Dun, FNP

## 2021-06-12 ENCOUNTER — Inpatient Hospital Stay (HOSPITAL_COMMUNITY)
Admission: AD | Admit: 2021-06-12 | Discharge: 2021-06-12 | Disposition: A | Discharge status: Home / Self Care | Payer: Medicaid Other | Attending: Obstetrics and Gynecology | Admitting: Obstetrics and Gynecology

## 2021-06-12 ENCOUNTER — Encounter (HOSPITAL_COMMUNITY): Payer: Self-pay | Admitting: Obstetrics and Gynecology

## 2021-06-12 ENCOUNTER — Other Ambulatory Visit: Payer: Self-pay

## 2021-06-12 DIAGNOSIS — N390 Urinary tract infection, site not specified: Secondary | ICD-10-CM | POA: Diagnosis not present

## 2021-06-12 DIAGNOSIS — Z20822 Contact with and (suspected) exposure to covid-19: Secondary | ICD-10-CM | POA: Diagnosis not present

## 2021-06-12 DIAGNOSIS — N3001 Acute cystitis with hematuria: Secondary | ICD-10-CM

## 2021-06-12 DIAGNOSIS — J09X2 Influenza due to identified novel influenza A virus with other respiratory manifestations: Secondary | ICD-10-CM | POA: Diagnosis present

## 2021-06-12 DIAGNOSIS — R509 Fever, unspecified: Secondary | ICD-10-CM

## 2021-06-12 DIAGNOSIS — J101 Influenza due to other identified influenza virus with other respiratory manifestations: Secondary | ICD-10-CM | POA: Insufficient documentation

## 2021-06-12 DIAGNOSIS — O2341 Unspecified infection of urinary tract in pregnancy, first trimester: Secondary | ICD-10-CM | POA: Insufficient documentation

## 2021-06-12 DIAGNOSIS — R52 Pain, unspecified: Secondary | ICD-10-CM

## 2021-06-12 DIAGNOSIS — Z3A10 10 weeks gestation of pregnancy: Secondary | ICD-10-CM

## 2021-06-12 DIAGNOSIS — J111 Influenza due to unidentified influenza virus with other respiratory manifestations: Secondary | ICD-10-CM | POA: Diagnosis not present

## 2021-06-12 LAB — URINALYSIS, ROUTINE W REFLEX MICROSCOPIC
Bilirubin Urine: NEGATIVE
Glucose, UA: NEGATIVE mg/dL
Hgb urine dipstick: NEGATIVE
Ketones, ur: >80 mg/dL — AB
Nitrite: POSITIVE — AB
Protein, ur: NEGATIVE mg/dL
Specific Gravity, Urine: 1.02 (ref 1.005–1.030)
pH: 6.5 (ref 5.0–8.0)

## 2021-06-12 LAB — RESP PANEL BY RT-PCR (FLU A&B, COVID) ARPGX2
Influenza A by PCR: POSITIVE — AB
Influenza B by PCR: NEGATIVE
SARS Coronavirus 2 by RT PCR: NEGATIVE

## 2021-06-12 LAB — URINALYSIS, MICROSCOPIC (REFLEX)

## 2021-06-12 MED ORDER — CEFADROXIL 500 MG PO CAPS: 500.0000 mg | ORAL_CAPSULE | Freq: Two times a day (BID) | ORAL | 0 refills | Status: DC

## 2021-06-12 MED ORDER — ACETAMINOPHEN 325 MG PO TABS
650.0000 mg | ORAL_TABLET | Freq: Once | ORAL | Status: AC
  Administered 2021-06-12: 650 mg via ORAL
  Filled 2021-06-12: qty 2

## 2021-06-12 MED ORDER — GUAIFENESIN ER 600 MG PO TB12: 600.0000 mg | ORAL_TABLET | Freq: Two times a day (BID) | ORAL | 1 refills | Status: DC | PRN

## 2021-06-12 MED ORDER — CEFADROXIL 500 MG PO CAPS: 500.0000 mg | ORAL_CAPSULE | Freq: Two times a day (BID) | ORAL | 0 refills | Status: AC

## 2021-06-12 MED ORDER — OSELTAMIVIR PHOSPHATE 75 MG PO CAPS
75.0000 mg | ORAL_CAPSULE | Freq: Two times a day (BID) | ORAL | 0 refills | Status: DC
Start: 2021-06-12 — End: 2021-07-24

## 2021-06-12 MED ORDER — PRENATAL VITAMIN 27-0.8 MG PO TABS: 1.0000 | ORAL_TABLET | Freq: Every day | ORAL | 12 refills | Status: DC

## 2021-06-12 NOTE — Progress Notes (Signed)
Written and verbal d/c instructions given and understanding voiced. 

## 2021-06-12 NOTE — MAU Note (Signed)
Feeling achy all over since this am. Has not taken any medicine. No pregnancy concerns. States there is some pain R abdomen but is not sure if related to the body aches. No VB.

## 2021-06-12 NOTE — MAU Provider Note (Addendum)
Chief Complaint: Generalized Body Aches   Event Date/Time   First Provider Initiated Contact with Patient 06/12/21 2033        SUBJECTIVE HPI: Molly Rose is a 21 y.o. G1P0 at [redacted]w[redacted]d by LMP who presents to maternity admissions reporting fever, body aches, slight cough, and headache.  Thighs ache.  . She denies vaginal bleeding, vaginal itching/burning, urinary symptoms, h/a, dizziness, n/v    Fever  This is a new problem. The current episode started today. The problem occurs constantly. The problem has been unchanged. Associated symptoms include coughing, headaches and muscle aches. Pertinent negatives include no abdominal pain, congestion, diarrhea, nausea, sore throat, urinary pain, vomiting or wheezing. She has tried nothing for the symptoms.  Risk factors: no sick contacts    RN Note: Feeling achy all over since this am. Has not taken any medicine. No pregnancy concerns. States there is some pain R abdomen but is not sure if related to the body aches. No VB.  Past Medical History:  Diagnosis Date   Medical history non-contributory    Past Surgical History:  Procedure Laterality Date   NO PAST SURGERIES     Social History   Socioeconomic History   Marital status: Significant Other    Spouse name: Not on file   Number of children: Not on file   Years of education: Not on file   Highest education level: Not on file  Occupational History   Not on file  Tobacco Use   Smoking status: Never   Smokeless tobacco: Never  Vaping Use   Vaping Use: Never used  Substance and Sexual Activity   Alcohol use: No    Alcohol/week: 0.0 standard drinks   Drug use: No   Sexual activity: Yes  Other Topics Concern   Not on file  Social History Narrative   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 She is doing very well. She lives with both parents and she has three siblings. She has one brother who is 86 yo and two sisters who are 67 yo & 88 yo. She enjoys soccer and volleyball.    Social Determinants of Health   Financial Resource Strain: Not on file  Food Insecurity: Not on file  Transportation Needs: Not on file  Physical Activity: Not on file  Stress: Not on file  Social Connections: Not on file  Intimate Partner Violence: Not on file   No current facility-administered medications on file prior to encounter.   Current Outpatient Medications on File Prior to Encounter  Medication Sig Dispense Refill   docusate sodium (COLACE) 100 MG capsule Take 100 mg by mouth 2 (two) times daily.     Prenatal Vit-Fe Fumarate-FA (PRENATAL MULTIVITAMIN) TABS tablet Take 1 tablet by mouth daily at 12 noon.     Doxylamine-Pyridoxine (DICLEGIS) 10-10 MG TBEC Take 2 tabs at bedtime. If needed, add another tab in the morning. If needed, add another tab in the afternoon, up to 4 tabs/day. 30 tablet 0   magnesium hydroxide (MILK OF MAGNESIA) 400 MG/5ML suspension Take 15 mLs by mouth daily as needed for mild constipation. 355 mL 0   polyethylene glycol powder (GLYCOLAX/MIRALAX) 17 GM/SCOOP powder Take 17 g by mouth daily. Can increase to twice daily if needed for soft stool daily/every other day. 500 g 0   Allergies  Allergen Reactions   Other     Seasonal Allergies    I have reviewed patient's Past Medical Hx, Surgical Hx, Family Hx, Social  Hx, medications and allergies.   ROS:  Review of Systems  Constitutional:  Positive for fever.  HENT:  Negative for congestion and sore throat.   Respiratory:  Positive for cough. Negative for wheezing.   Gastrointestinal:  Negative for abdominal pain, diarrhea, nausea and vomiting.  Genitourinary:  Negative for dysuria.  Neurological:  Positive for headaches.  Review of Systems  Other systems negative   Physical Exam  Physical Exam Patient Vitals for the past 24 hrs:  BP Temp Pulse Resp SpO2 Height Weight  06/12/21 2013 120/64 -- -- -- -- -- --  06/12/21 2012 -- (!) 101.2 F (38.4 C) (!) 127 18 100 % 5' (1.524 m) 52.6 kg    Constitutional: Well-developed, well-nourished female in no acute distress.  Cardiovascular: tachycardia, regular rhythm Respiratory: normal effort, CTAB, no wheezes Oxygen saturations 99%, maintained while walking GI: Abd soft, non-tender. Pos BS x 4 MS: Extremities nontender, no edema, normal ROM Neurologic: Alert and oriented x 4.  GU: Neg CVAT.  PELVIC EXAM: deferred  FHT 170 by doppler  LAB RESULTS Results for orders placed or performed during the hospital encounter of 06/12/21 (from the past 24 hour(s))  Resp Panel by RT-PCR (Flu A&B, Covid) Nasopharyngeal Swab     Status: Abnormal   Collection Time: 06/12/21  7:59 PM   Specimen: Nasopharyngeal Swab; Nasopharyngeal(NP) swabs in vial transport medium  Result Value Ref Range   SARS Coronavirus 2 by RT PCR NEGATIVE NEGATIVE   Influenza A by PCR POSITIVE (A) NEGATIVE   Influenza B by PCR NEGATIVE NEGATIVE  Urinalysis, Routine w reflex microscopic Urine, Clean Catch     Status: Abnormal   Collection Time: 06/12/21  8:20 PM  Result Value Ref Range   Color, Urine YELLOW YELLOW   APPearance CLOUDY (A) CLEAR   Specific Gravity, Urine 1.020 1.005 - 1.030   pH 6.5 5.0 - 8.0   Glucose, UA NEGATIVE NEGATIVE mg/dL   Hgb urine dipstick NEGATIVE NEGATIVE   Bilirubin Urine NEGATIVE NEGATIVE   Ketones, ur >80 (A) NEGATIVE mg/dL   Protein, ur NEGATIVE NEGATIVE mg/dL   Nitrite POSITIVE (A) NEGATIVE   Leukocytes,Ua SMALL (A) NEGATIVE  Urinalysis, Microscopic (reflex)     Status: Abnormal   Collection Time: 06/12/21  8:20 PM  Result Value Ref Range   RBC / HPF 0-5 0 - 5 RBC/hpf   WBC, UA 0-5 0 - 5 WBC/hpf   Bacteria, UA MANY (A) NONE SEEN   Squamous Epithelial / LPF 21-50 0 - 5   Mucus PRESENT      --/--/O POS (12/21 1916)  IMAGING   MAU Management/MDM: Ordered respiratory panel and UA Suspect influenza based on symptoms and exam Discussed treatment with Tamiflu, Mucinex and supportive care Urine sent for culture Positive  for nitrites, will treat for UTI but still suspect Flu  ASSESSMENT Single IUP at [redacted]w[redacted]d Influenza-like illness Urinary Tract Infection  PLAN Discharge home Rx Duricef for presumed UTI, urine to culture Rx Tamiflu for presumed flu Rx Mucinex for presumed flu Refill PNV per request  Pt stable at time of discharge. Encouraged to return here if she develops worsening of symptoms, increase in pain, fever, or other concerning symptoms.    Wynelle Bourgeois CNM, MSN Certified Nurse-Midwife 06/12/2021  8:33 PM  Resulted after patient left + Influenza A Patient notified Aviva Signs, CNM

## 2021-06-12 NOTE — MAU Note (Signed)
Pt walked around some with pulse ox on and value never dropped below 99

## 2021-06-15 LAB — CULTURE, OB URINE: Culture: >=100000 — AB

## 2021-06-22 ENCOUNTER — Ambulatory Visit (INDEPENDENT_AMBULATORY_CARE_PROVIDER_SITE_OTHER): Payer: Medicaid Other

## 2021-06-22 ENCOUNTER — Other Ambulatory Visit: Payer: Self-pay

## 2021-06-22 DIAGNOSIS — Z3480 Encounter for supervision of other normal pregnancy, unspecified trimester: Secondary | ICD-10-CM | POA: Insufficient documentation

## 2021-06-22 DIAGNOSIS — Z3400 Encounter for supervision of normal first pregnancy, unspecified trimester: Secondary | ICD-10-CM | POA: Insufficient documentation

## 2021-06-22 MED ORDER — GOJJI WEIGHT SCALE MISC: 1.0000 | 0 refills | Status: DC | PRN

## 2021-06-22 MED ORDER — BLOOD PRESSURE KIT DEVI: 1.0000 | 0 refills | Status: DC | PRN

## 2021-06-22 NOTE — Progress Notes (Signed)
..  New OB Intake  I connected with  Molly Rose on 06/22/21 at  9:00 AM EST in person and verified that I am speaking with the correct person using two identifiers. Nurse is located at [facility] and pt is located at [facility].  I discussed the limitations, risks, security and privacy concerns of performing an evaluation and management service by telephone and the availability of in person appointments. I also discussed with the patient that there may be a patient responsible charge related to this service. The patient expressed understanding and agreed to proceed.  I explained I am completing New OB Intake today. We discussed her EDD of 01-03-2022 that is based on LMP of 03-29-2021. Pt is G1/P0. I reviewed her allergies, medications, Medical/Surgical/OB history, and appropriate screenings. I informed her of [facility] services. Based on history, this is a/an  pregnancy uncomplicated .   Patient Active Problem List   Diagnosis Date Noted   Migraine without aura and without status migrainosus, not intractable 09/18/2015   Complicated migraine 09/18/2015   Episodic tension-type headache 09/18/2015    Concerns addressed today  Delivery Plans:  Plans to deliver at [facility].   MyChart/Babyscripts MyChart access verified. I explained pt will have some visits in office and some virtually. Babyscripts instructions given and order placed. Patient verifies receipt of registration text/e-mail. Account successfully created and app downloaded.  Blood Pressure Cuff  Blood pressure cuff ordered for patient to pick-up from [employer]. Explained after first prenatal appt pt will check weekly and document in Babyscripts.  Weight scale: Patient does not  have weight scale. Weight scale ordered for patient to pick up from [employer].   Anatomy US Explained first scheduled [place] will be around 19 weeks. Anatomy US will be scheduled at future date.  Labs Discussed Avelina Laine genetic screening with patient.  Would like both Panorama and Horizon drawn at new OB visit. Routine prenatal labs needed.  Covid Vaccine Patient has not covid vaccine.   CenteringPregnancy Candidate?  If yes, offer as possibility  Mother/ Baby Dyad Candidate?    If yes, offer as possibility  Informed patient of [facility] website  and placed link in her AVS.   Social Determinants of Health Food Insecurity: Patient denies food insecurity. WIC Referral: Patient is not interested in referral to [facility].  Transportation: Patient denies transportation needs. Childcare: Discussed no children allowed at ultrasound appointments. Offered childcare services; patient declines childcare services at this time.  Send link to Pregnancy Navigators   Placed OB Box on problem list and updated  First visit review I reviewed new OB appt with pt. I explained she will have a pelvic exam, ob bloodwork with genetic screening, and PAP smear. Explained pt will be seen by Gerrit Heck, CNM at first visit; encounter routed to appropriate provider. Explained that patient will be seen by pregnancy navigator following visit with provider. [facility] information placed in AVS.   Katrina Stack, RN 06/22/2021  9:08 AM

## 2021-06-27 ENCOUNTER — Other Ambulatory Visit: Payer: Self-pay

## 2021-06-27 ENCOUNTER — Ambulatory Visit (INDEPENDENT_AMBULATORY_CARE_PROVIDER_SITE_OTHER): Payer: Medicaid Other

## 2021-06-27 VITALS — BP 112/61 | HR 78 | Wt 117.0 lb

## 2021-06-27 DIAGNOSIS — K5909 Other constipation: Secondary | ICD-10-CM | POA: Diagnosis not present

## 2021-06-27 DIAGNOSIS — Z23 Encounter for immunization: Secondary | ICD-10-CM

## 2021-06-27 DIAGNOSIS — Z3A12 12 weeks gestation of pregnancy: Secondary | ICD-10-CM | POA: Diagnosis not present

## 2021-06-27 DIAGNOSIS — O2341 Unspecified infection of urinary tract in pregnancy, first trimester: Secondary | ICD-10-CM

## 2021-06-27 DIAGNOSIS — Z3480 Encounter for supervision of other normal pregnancy, unspecified trimester: Secondary | ICD-10-CM | POA: Diagnosis not present

## 2021-06-27 DIAGNOSIS — Z3400 Encounter for supervision of normal first pregnancy, unspecified trimester: Secondary | ICD-10-CM

## 2021-06-27 LAB — HEPATITIS C ANTIBODY: HCV Ab: NEGATIVE

## 2021-06-27 MED ORDER — CEFADROXIL 500 MG PO CAPS: 500.0000 mg | ORAL_CAPSULE | Freq: Two times a day (BID) | ORAL | 0 refills | Status: DC

## 2021-06-27 NOTE — Progress Notes (Signed)
Pt presents for NOB visit reports pink vaginal discharge after wiping x 1 time last week. Denies any other episodes.  Pt did not take Abx for recent UTI.  NOB intake completed 06/22/21

## 2021-06-27 NOTE — Progress Notes (Signed)
Subjective:   Molly Rose is a 21 y.o. G1P0 at 34w6dby Definite LMP of April 01, 2021 and confirmed by early [place]oreabeing seen today for her first obstetrical visit.  Patient states this was an planned pregnancy.  Patient reports she not on birth control prior to conception. She reports taking Depo Provera that she discontinued 10-11 months prior to conception.  Gynecological/Obstetrical History: Patient reports no history of gynecological surgeries.  Patient has no history of abnormal pap smears.   Pregnancy history fully reviewed. Patient does intend to breast and bottlefeed. Patient obstetrical history is unremarkable.   Sexual Activity and Vaginal Concerns: Patient is not currently sexually active. She reports vaginal discharge and some spotting last week.  She denies bleeding, irritation, or odor. Patient also denies pain or difficulty with urination.   Medical History/ROS: Patient denies medical history significant for cardiovascular, respiratory, gastrointestinal, or hematological disorders. Patient also denies history of MH disorders including anxiety and/or depression. She endorses a history of migraines, but none during the pregnancy.  Patient reports no complaints.  Patient reports both constipation and diarrhea.  She reports she started taking Miralax for constipation and is now having diarrhea. She denies nausea/vomiting.  No recurrent headaches.   Social History: Patient denies history or current usage of tobacco, alcohol, or drugs.  Patient reports the FOB is LRica Motewho is involved, supportive, and not present d/t work.  Patient reports that she lives with sister and her children (3) and endorses safety at home.  Patient denies DV/A. Patient is currently employed at [employer], water quality  HISTORY: OB History  Gravida Para Term Preterm AB Living  1 0 0 0 0 0  SAB IAB Ectopic Multiple Live Births  0 0 0 0 0    # Outcome Date GA Lbr  Len/2nd Weight Sex Delivery Anes PTL Lv  1 Current             No pap smear was done d/t age.  However, patient informed that pap will occur during PPV.   Past Medical History:  Diagnosis Date   Medical history non-contributory    Past Surgical History:  Procedure Laterality Date   NO PAST SURGERIES     No family history on file. Social History   Tobacco Use   Smoking status: Never   Smokeless tobacco: Never  Vaping Use   Vaping Use: Former  Substance Use Topics   Alcohol use: No    Alcohol/week: 0.0 standard drinks   Drug use: No   Allergies  Allergen Reactions   Other     Seasonal Allergies   Current Outpatient Medications on File Prior to Visit  Medication Sig Dispense Refill   Blood Pressure Monitoring (BLOOD PRESSURE KIT) DEVI 1 kit by Does not apply route as needed. 1 each 0   Misc. Devices (GOJJI WEIGHT SCALE) MISC 1 Device by Does not apply route as needed. 1 each 0   Prenatal Vit-Fe Fumarate-FA (PRENATAL VITAMIN) 27-0.8 MG TABS Take 1 tablet by mouth daily. 30 tablet 12   docusate sodium (COLACE) 100 MG capsule Take 100 mg by mouth 2 (two) times daily. (Patient not taking: Reported on 06/22/2021)     Doxylamine-Pyridoxine (DICLEGIS) 10-10 MG TBEC Take 2 tabs at bedtime. If needed, add another tab in the morning. If needed, add another tab in the afternoon, up to 4 tabs/day. (Patient not taking: Reported on 06/22/2021) 30 tablet 0   guaiFENesin (MUCINEX) 600 MG  12 hr tablet Take 1 tablet (600 mg total) by mouth 2 (two) times daily as needed for cough. (Patient not taking: Reported on 06/22/2021) 30 tablet 1   magnesium hydroxide (MILK OF MAGNESIA) 400 MG/5ML suspension Take 15 mLs by mouth daily as needed for mild constipation. (Patient not taking: Reported on 06/22/2021) 355 mL 0   oseltamivir (TAMIFLU) 75 MG capsule Take 1 capsule (75 mg total) by mouth every 12 (twelve) hours. (Patient not taking: Reported on 06/22/2021) 10 capsule 0   polyethylene glycol powder  (GLYCOLAX/MIRALAX) 17 GM/SCOOP powder Take 17 g by mouth daily. Can increase to twice daily if needed for soft stool daily/every other day. (Patient not taking: Reported on 06/27/2021) 500 g 0   Prenatal Vit-Fe Fumarate-FA (PRENATAL MULTIVITAMIN) TABS tablet Take 1 tablet by mouth daily at 12 noon. (Patient not taking: Reported on 06/27/2021)     No current facility-administered medications on file prior to visit.    Review of Systems Pertinent items noted in HPI and remainder of comprehensive ROS otherwise negative.  Exam   Vitals:   06/27/21 1518  BP: 112/61  Pulse: 78  Weight: 117 lb (53.1 kg)      Physical Exam Constitutional:      Appearance: Normal appearance.  HENT:     Head: Normocephalic and atraumatic.  Eyes:     Conjunctiva/sclera: Conjunctivae normal.  Cardiovascular:     Rate and Rhythm: Normal rate and regular rhythm.     Heart sounds: Normal heart sounds.  Pulmonary:     Effort: Pulmonary effort is normal. No respiratory distress.     Breath sounds: Normal breath sounds.  Abdominal:     General: Bowel sounds are normal.     Palpations: Abdomen is soft.     Tenderness: There is no abdominal tenderness.  Musculoskeletal:        General: Normal range of motion.     Cervical back: Normal range of motion.  Neurological:     Mental Status: She is alert and oriented to person, place, and time.  Skin:    General: Skin is warm and dry.  Psychiatric:        Mood and Affect: Mood normal.        Behavior: Behavior normal.        Thought Content: Thought content normal.  Vitals reviewed.    Assessment:   21 y.o. year old G1P0 Patient Active Problem List   Diagnosis Date Noted   Supervision of normal first pregnancy, antepartum 06/22/2021   Migraine without aura and without status migrainosus, not intractable 44/31/5400   Complicated migraine 86/76/1950   Episodic tension-type headache 09/18/2015     Plan:  1. Supervision of normal first pregnancy,  antepartum -Congratulations given and patient welcomed to practice. -Discussed usage of Babyscripts and virtual visits as additional source of managing and completing PN visits.   *Instructed to take blood pressure and record weekly into babyscripts. *Reviewed prenatal visit schedule and platforms used for virtual visits.  -Anticipatory guidance for prenatal visits including labs, ultrasounds, and testing; Initial labs reviewed. -Genetic Screening discussed, First trimester screen, Quad screen, and NIPS: ordered. -Encouraged to complete and utilize MyChart Registration for her ability to review results, send requests, and have questions addressed.  -Discussed estimated due date of January 03, 2022. -Ultrasound discussed; fetal anatomic survey: ordered. -Continue prenatal vitamins  -Influenza offered and accepted. -Encouraged to seek out care at office or emergency room for urgent and/or emergent concerns. -Educated on the nature of  [facility] with multiple MDs and other Advanced Practice Providers was explained to patient; also emphasized that residents, students are part of our team. Informed of her right to refuse care as she deems appropriate.  -No questions or concerns.   2. [redacted] weeks gestation of pregnancy -Doing well overall. -Reviewed vaginal spotting in pregnancy and when to be concerned.  3. Other constipation -Instructed to discontinue usage of miralax as constipation has resolved.  Further informed that diarrhea will resolve once she stops taking Miralax. -Discussed increased intake of fiber, water, and OTC docusate sodium for return of constipation.  4. UTI (urinary tract infection) during pregnancy, first trimester -Patient states she did not pick up prescription for Duricef. -Will resend for known UTI with E.Coli. -Discussed need to complete meds entirely. -Pyelo precautions given. -Plan to perform TOC at [facility].     Problem list reviewed  and updated. Routine obstetric precautions reviewed.  No orders of the defined types were placed in this encounter.   No follow-ups on file.     Maryann Conners, CNM 06/27/2021 3:56 PM

## 2021-06-28 LAB — RPR: RPR Ser Ql: NONREACTIVE

## 2021-06-28 LAB — CBC
Hematocrit: 33.8 % — ABNORMAL LOW (ref 34.0–46.6)
Hemoglobin: 11.6 g/dL (ref 11.1–15.9)
MCH: 30.6 pg (ref 26.6–33.0)
MCHC: 34.3 g/dL (ref 31.5–35.7)
MCV: 89 fL (ref 79–97)
Platelets: 263 10*3/uL (ref 150–450)
RBC: 3.79 x10E6/uL (ref 3.77–5.28)
RDW: 12.7 % (ref 11.7–15.4)
WBC: 9.2 10*3/uL (ref 3.4–10.8)

## 2021-06-28 LAB — HIV ANTIBODY (ROUTINE TESTING W REFLEX): HIV Screen 4th Generation wRfx: NONREACTIVE

## 2021-06-28 LAB — HEPATITIS C ANTIBODY: Hep C Virus Ab: <0.1 s/co ratio (ref 0.0–0.9)

## 2021-06-28 LAB — HEPATITIS B SURFACE ANTIGEN: Hepatitis B Surface Ag: NEGATIVE

## 2021-07-02 ENCOUNTER — Telehealth: Payer: Medicaid Other | Admitting: Nurse Practitioner

## 2021-07-02 DIAGNOSIS — R519 Headache, unspecified: Secondary | ICD-10-CM

## 2021-07-02 DIAGNOSIS — R300 Dysuria: Secondary | ICD-10-CM

## 2021-07-02 NOTE — Progress Notes (Signed)
Virtual Visit Consent   Riki Berninger, you are scheduled for a virtual visit with a [facility] provider today.     Just as with appointments in the office, your consent must be obtained to participate.  Your consent will be active for this visit and any virtual visit you may have with one of our providers in the next 365 days.     If you have a MyChart account, a copy of this consent can be sent to you electronically.  All virtual visits are billed to your insurance company just like a traditional visit in the office.    As this is a virtual visit, video technology does not allow for your provider to perform a traditional examination.  This may limit your provider's ability to fully assess your condition.  If your provider identifies any concerns that need to be evaluated in person or the need to arrange testing (such as labs, EKG, etc.), we will make arrangements to do so.     Although advances in technology are sophisticated, we cannot ensure that it will always work on either your end or our end.  If the connection with a video visit is poor, the visit may have to be switched to a telephone visit.  With either a video or telephone visit, we are not always able to ensure that we have a secure connection.     I need to obtain your verbal consent now.   Are you willing to proceed with your visit today?    [place] has provided verbal consent on 07/02/2021 for a virtual visit (video or telephone).   Apolonio Schneiders, FNP   Date: 07/02/2021 4:01 PM   Virtual Visit via Video Note   I, Apolonio Schneiders, connected with  Shantai Tiedeman  ([id number], 07-May-2001) on 07/02/21 at  4:00 PM EST by a video-enabled telemedicine application and verified that I am speaking with the correct person using two identifiers.  Location: Patient: Virtual Visit Location Patient: Home Provider: Virtual Visit Location Provider: Home Office   I discussed the limitations of evaluation and management by  telemedicine and the availability of in person appointments. The patient expressed understanding and agreed to proceed.    History of Present Illness: Geraldyne Barraclough Daivd Council is a 21 y.o. who identifies as a female who was assigned female at birth, and is being seen today for her headaches and recent UTI.   She was recently prescribed Cefadroxil for the past 3-4 days and has since developed burning when urinating that was not initially present. Her UTI was found in January while she in the ER and she was unable to pick up her prescription at that time due to a pharmacy mix up so her start date for the antibiotic was delayed.   She denies back pain today, denies fever, denies any new vaginal discharge.   She is [redacted] weeks pregnant currently.   She has also been suffering from headaches that were not initially there at the start of her pregnancy though she has been treated for migraines in the past.   She has taken tylenol in the past, she has not taken any tylenol since having this headache.   Her headache today is mild at rest but is much worse when she has to go to work. She can reduce the pain through rest.   She denies any vomiting with the headache, she has had nausea but also had that prior to headache onset  with pregnancy.    Problems:  Patient Active Problem List   Diagnosis Date Noted   Supervision of normal first pregnancy, antepartum 06/22/2021   Migraine without aura and without status migrainosus, not intractable 20/94/7096   Complicated migraine 28/36/6294   Episodic tension-type headache 09/18/2015    Allergies:  Allergies  Allergen Reactions   Other     Seasonal Allergies   Medications:  Current Outpatient Medications:    Blood Pressure Monitoring (BLOOD PRESSURE KIT) DEVI, 1 kit by Does not apply route as needed., Disp: 1 each, Rfl: 0   cefadroxil (DURICEF) 500 MG capsule, Take 1 capsule (500 mg total) by mouth 2 (two) times daily., Disp: 14 capsule, Rfl: 0   docusate  sodium (COLACE) 100 MG capsule, Take 100 mg by mouth 2 (two) times daily. (Patient not taking: Reported on 06/22/2021), Disp: , Rfl:    Doxylamine-Pyridoxine (DICLEGIS) 10-10 MG TBEC, Take 2 tabs at bedtime. If needed, add another tab in the morning. If needed, add another tab in the afternoon, up to 4 tabs/day. (Patient not taking: Reported on 06/22/2021), Disp: 30 tablet, Rfl: 0   guaiFENesin (MUCINEX) 600 MG 12 hr tablet, Take 1 tablet (600 mg total) by mouth 2 (two) times daily as needed for cough. (Patient not taking: Reported on 06/22/2021), Disp: 30 tablet, Rfl: 1   magnesium hydroxide (MILK OF MAGNESIA) 400 MG/5ML suspension, Take 15 mLs by mouth daily as needed for mild constipation. (Patient not taking: Reported on 06/22/2021), Disp: 355 mL, Rfl: 0   Misc. Devices (GOJJI WEIGHT SCALE) MISC, 1 Device by Does not apply route as needed., Disp: 1 each, Rfl: 0   oseltamivir (TAMIFLU) 75 MG capsule, Take 1 capsule (75 mg total) by mouth every 12 (twelve) hours. (Patient not taking: Reported on 06/22/2021), Disp: 10 capsule, Rfl: 0   polyethylene glycol powder (GLYCOLAX/MIRALAX) 17 GM/SCOOP powder, Take 17 g by mouth daily. Can increase to twice daily if needed for soft stool daily/every other day. (Patient not taking: Reported on 06/27/2021), Disp: 500 g, Rfl: 0   Prenatal Vit-Fe Fumarate-FA (PRENATAL MULTIVITAMIN) TABS tablet, Take 1 tablet by mouth daily at 12 noon. (Patient not taking: Reported on 06/27/2021), Disp: , Rfl:    Prenatal Vit-Fe Fumarate-FA (PRENATAL VITAMIN) 27-0.8 MG TABS, Take 1 tablet by mouth daily., Disp: 30 tablet, Rfl: 12  Observations/Objective: Patient is well-developed, well-nourished in no acute distress.  Resting comfortably at home.  Head is normocephalic, atraumatic.  No labored breathing.  Speech is clear and coherent with logical content.  Patient is alert and oriented at baseline.    Assessment and Plan: 1. Nonintractable headache, unspecified chronicity pattern,  unspecified headache type Patient is reassured she can use tylenol for headache but should contact OBGYN to set up a plan for migraine management during pregnancy .   2. Dysuria Advised patient to call OBGYN today and let them know her current symptoms, advised repeat urine culture. If unable to be seen at [facility] she may utilize an urgent care for recheck of urine.  Continue antibiotics until evaluated.   Seek immediate medical care for fever, back pain, N/V as discussed          Follow Up Instructions: I discussed the assessment and treatment plan with the patient. The patient was provided an opportunity to ask questions and all were answered. The patient agreed with the plan and demonstrated an understanding of the instructions.  A copy of instructions were sent to the patient via MyChart unless otherwise noted  below.    The patient was advised to call back or seek an in-person evaluation if the symptoms worsen or if the condition fails to improve as anticipated.  Time:  I spent 10 minutes with the patient via telehealth technology discussing the above problems/concerns.    Apolonio Schneiders, FNP

## 2021-07-12 ENCOUNTER — Ambulatory Visit
Admission: EM | Admit: 2021-07-12 | Discharge: 2021-07-12 | Disposition: A | Discharge status: Home / Self Care | Payer: Medicaid Other | Attending: Emergency Medicine | Admitting: Emergency Medicine

## 2021-07-12 ENCOUNTER — Other Ambulatory Visit: Payer: Self-pay

## 2021-07-12 ENCOUNTER — Encounter: Payer: Self-pay | Admitting: Emergency Medicine

## 2021-07-12 ENCOUNTER — Telehealth: Payer: Medicaid Other | Admitting: Family Medicine

## 2021-07-12 DIAGNOSIS — R197 Diarrhea, unspecified: Secondary | ICD-10-CM | POA: Diagnosis not present

## 2021-07-12 DIAGNOSIS — N3000 Acute cystitis without hematuria: Secondary | ICD-10-CM | POA: Diagnosis not present

## 2021-07-12 DIAGNOSIS — R300 Dysuria: Secondary | ICD-10-CM | POA: Insufficient documentation

## 2021-07-12 LAB — POCT URINALYSIS DIP (MANUAL ENTRY)
Bilirubin, UA: NEGATIVE
Glucose, UA: NEGATIVE mg/dL
Ketones, POC UA: NEGATIVE mg/dL
Nitrite, UA: POSITIVE — AB
Protein Ur, POC: 30 mg/dL — AB
Spec Grav, UA: 1.015 (ref 1.010–1.025)
Urobilinogen, UA: 1 E.U./dL
pH, UA: 8.5 — AB (ref 5.0–8.0)

## 2021-07-12 MED ORDER — DICYCLOMINE HCL 20 MG PO TABS: 20.0000 mg | ORAL_TABLET | Freq: Two times a day (BID) | ORAL | 0 refills | Status: DC

## 2021-07-12 MED ORDER — DOCUSATE SODIUM 100 MG PO CAPS: 100.0000 mg | ORAL_CAPSULE | Freq: Two times a day (BID) | ORAL | 0 refills | Status: DC | PRN

## 2021-07-12 MED ORDER — CEPHALEXIN 500 MG PO CAPS: 500.0000 mg | ORAL_CAPSULE | Freq: Four times a day (QID) | ORAL | 0 refills | Status: AC

## 2021-07-12 NOTE — Progress Notes (Signed)
[place]  Vomiting, Diarrhea OTC not working- belly pain and increased urination needs in person for samples. Message sent

## 2021-07-12 NOTE — Addendum Note (Signed)
Addended by: Freddy Finner on: 07/12/2021 07:24 AM   Modules accepted: Level of Service

## 2021-07-12 NOTE — ED Provider Notes (Signed)
[facility]  ____________________________________________  Time seen: Approximately 1:37 PM  I have reviewed the triage vital signs and the nursing notes.   HISTORY  Chief Complaint Diarrhea   Historian Patient     HPI Molly Rose is a 21 y.o. female G1, P0 presents to the urgent care with diarrhea and concern for a urinary tract infection.  Patient states that she developed diarrhea the day she started [facility].  Patient states that she finished the medication as directed but noticed over the past 24 hours that her urine appeared cloudy.  She denies dysuria, fever, low back pain or new nausea or vomiting.  Patient states that she had 2 episodes of diarrhea today and had to miss work.  She denies vaginal bleeding or pelvic pain.  She is approximately [redacted] weeks pregnant.  Patient states that she has not routinely urinating after intercourse.   Past Medical History:  Diagnosis Date   Medical history non-contributory      Immunizations up to date:  Yes.     Past Medical History:  Diagnosis Date   Medical history non-contributory     Patient Active Problem List   Diagnosis Date Noted   Supervision of normal first pregnancy, antepartum 06/22/2021   Migraine without aura and without status migrainosus, not intractable 20/25/4270   Complicated migraine 62/37/6283   Episodic tension-type headache 09/18/2015    Past Surgical History:  Procedure Laterality Date   NO PAST SURGERIES      Prior to Admission medications   Medication Sig Start Date End Date Taking? Authorizing Provider  cephALEXin (KEFLEX) 500 MG capsule Take 1 capsule (500 mg total) by mouth 4 (four) times daily for 7 days. 07/12/21 07/19/21 Yes Vallarie Mare M, PA-C  dicyclomine (BENTYL) 20 MG tablet Take 1 tablet (20 mg total) by mouth 2 (two) times daily for 7 days. 07/12/21 07/19/21 Yes Vallarie Mare M, PA-C  Blood Pressure Monitoring (BLOOD PRESSURE KIT) DEVI 1 kit by Does not apply route as  needed. 06/22/21   Woodroe Mode, MD  docusate sodium (COLACE) 100 MG capsule Take 100 mg by mouth 2 (two) times daily. Patient not taking: Reported on 06/22/2021    [provider]  docusate sodium (COLACE) 100 MG capsule Take 1 capsule (100 mg total) by mouth 2 (two) times daily as needed. 07/12/21   Constant, Peggy, MD  Doxylamine-Pyridoxine (DICLEGIS) 10-10 MG TBEC Take 2 tabs at bedtime. If needed, add another tab in the morning. If needed, add another tab in the afternoon, up to 4 tabs/day. Patient not taking: Reported on 06/22/2021 05/16/21   Patriciaann Clan, DO  guaiFENesin (MUCINEX) 600 MG 12 hr tablet Take 1 tablet (600 mg total) by mouth 2 (two) times daily as needed for cough. Patient not taking: Reported on 06/22/2021 06/12/21   Seabron Spates, CNM  magnesium hydroxide (MILK OF MAGNESIA) 400 MG/5ML suspension Take 15 mLs by mouth daily as needed for mild constipation. Patient not taking: Reported on 06/22/2021 06/04/21   Sharion Balloon, FNP  Misc. Devices (GOJJI WEIGHT SCALE) MISC 1 Device by Does not apply route as needed. 06/22/21   Woodroe Mode, MD  oseltamivir (TAMIFLU) 75 MG capsule Take 1 capsule (75 mg total) by mouth every 12 (twelve) hours. Patient not taking: Reported on 06/22/2021 06/12/21   Seabron Spates, CNM  polyethylene glycol powder (GLYCOLAX/MIRALAX) 17 GM/SCOOP powder Take 17 g by mouth daily. Can increase to twice daily if needed for soft stool  daily/every other day. Patient not taking: Reported on 06/27/2021 05/16/21   Patriciaann Clan, DO  Prenatal Vit-Fe Fumarate-FA (PRENATAL MULTIVITAMIN) TABS tablet Take 1 tablet by mouth daily at 12 noon. Patient not taking: Reported on 06/27/2021    [provider]  Prenatal Vit-Fe Fumarate-FA (PRENATAL VITAMIN) 27-0.8 MG TABS Take 1 tablet by mouth daily. 06/12/21   Seabron Spates, CNM    Allergies Other  Family History  Problem Relation Age of Onset   Healthy Mother    Healthy Father      Social History Social History   Tobacco Use   Smoking status: Never   Smokeless tobacco: Never  Vaping Use   Vaping Use: Former  Substance Use Topics   Alcohol use: No    Alcohol/week: 0.0 standard drinks   Drug use: No     Review of Systems  Constitutional: No fever/chills Eyes:  No discharge ENT: No upper respiratory complaints. Respiratory: no cough. No SOB/ use of accessory muscles to breath Gastrointestinal: Patient has diarrhea Musculoskeletal: Negative for musculoskeletal pain. Skin: Negative for rash, abrasions, lacerations, ecchymosis.   ____________________________________________   PHYSICAL EXAM:  VITAL SIGNS: ED Triage Vitals [07/12/21 1312]  Enc Vitals Group     BP 99/64     Pulse Rate 79     Resp 18     Temp 99.1 F (37.3 C)     Temp Source Oral     SpO2 95 %     Weight      Height      Head Circumference      Peak Flow      Pain Score 6     Pain Loc      Pain Edu?      Excl. in [place]?      Constitutional: Alert and oriented. Well appearing and in no acute distress. Eyes: Conjunctivae are normal. PERRL. EOMI. Head: Atraumatic. ENT:       Nose: No congestion/rhinnorhea.      Mouth/Throat: Mucous membranes are moist.  Neck: No stridor.  No cervical spine tenderness to palpation. Cardiovascular: Normal rate, regular rhythm. Normal S1 and S2.  Good peripheral circulation. Respiratory: Normal respiratory effort without tachypnea or retractions. Lungs CTAB. Good air entry to the bases with no decreased or absent breath sounds Gastrointestinal: Bowel sounds x 4 quadrants. Soft and nontender to palpation. No guarding or rigidity. No distention. Musculoskeletal: Full range of motion to all extremities. No obvious deformities noted Neurologic:  Normal for age. No gross focal neurologic deficits are appreciated.  Skin:  Skin is warm, dry and intact. No rash noted. Psychiatric: Mood and affect are normal for age. Speech and behavior are normal.    ____________________________________________   LABS (all labs ordered are listed, but only abnormal results are displayed)  Labs Reviewed  POCT URINALYSIS DIP (MANUAL ENTRY) - Abnormal; Notable for the following components:      Result Value   Clarity, UA cloudy (*)    Blood, UA trace-intact (*)    pH, UA 8.5 (*)    Protein Ur, POC =30 (*)    Nitrite, UA Positive (*)    Leukocytes, UA Small (1+) (*)    All other components within normal limits   ____________________________________________  EKG   ____________________________________________  RADIOLOGY   No results found.  ____________________________________________    PROCEDURES  Procedure(s) performed:     Procedures     Medications - No data to display   ____________________________________________   INITIAL IMPRESSION /  ASSESSMENT AND PLAN / ED COURSE  Pertinent labs & imaging results that were available during my care of the patient were reviewed by me and considered in my medical decision making (see chart for details).      Assessment and plan Diarrhea UTI 21 year old female approximately [redacted] weeks pregnant presents to the urgent care with diarrhea and concern for urinary tract infection.  Patient was alert, active and nontoxic-appearing.  Abdomen was soft and nontender.  Urinalysis was concerning for UTI with nitrates, leukocytes and trace red blood cells.  Urine culture is pending but prior culture grew E. coli.  We will treat with Keflex 4 times daily for the next 7 days and will prescribe Bentyl for diarrhea.  Patient was advised to urinate after intercourse.  Patient was cautioned that should she develop vaginal bleeding, pelvic pain, fever or low back pain she should seek care at the women Center in [place].     ____________________________________________  FINAL CLINICAL IMPRESSION(S) / ED DIAGNOSES  Final diagnoses:  Diarrhea, unspecified type  Acute cystitis without  hematuria      NEW MEDICATIONS STARTED DURING THIS VISIT:  ED Discharge Orders          Ordered    cephALEXin (KEFLEX) 500 MG capsule  4 times daily        07/12/21 1333    dicyclomine (BENTYL) 20 MG tablet  2 times daily        07/12/21 1333                This chart was dictated using voice recognition software/Dragon. Despite best efforts to proofread, errors can occur which can change the meaning. Any change was purely unintentional.     Lannie Fields, PA-C 07/12/21 1342

## 2021-07-12 NOTE — Discharge Instructions (Addendum)
Take Keflex 4 times daily for the next 7 days. Take Bentyl twice daily to help with diarrhea. If you experience abdominal discomfort, vaginal bleeding, fever or back pain, please go to the women Center in [place].

## 2021-07-12 NOTE — Progress Notes (Signed)
Pt requests a stool softener. Colace sent per protocol.

## 2021-07-12 NOTE — ED Triage Notes (Signed)
Patient presents to [facility] for evaluation of diarrhea since last week, but worse the last two days.  She states she was taking an antibiotic previously when diarrhea originally started,  States she has not been able to go to work yesterday or today.  Patient states her urine has been cloudy as well, and wants ot be sure that infection has resolved.  Denies abdominal pain regularly, but will have cramping a few minutes prior to bowel movement.  C/o nausea, but is [redacted] weeks pregnant, has prenatal care going

## 2021-07-14 DIAGNOSIS — Z148 Genetic carrier of other disease: Secondary | ICD-10-CM | POA: Insufficient documentation

## 2021-07-14 LAB — URINE CULTURE: Culture: >=100000 — AB

## 2021-07-23 ENCOUNTER — Telehealth: Payer: Medicaid Other | Admitting: Nurse Practitioner

## 2021-07-23 ENCOUNTER — Encounter: Payer: Self-pay | Admitting: Nurse Practitioner

## 2021-07-23 DIAGNOSIS — R1084 Generalized abdominal pain: Secondary | ICD-10-CM | POA: Diagnosis not present

## 2021-07-23 NOTE — Progress Notes (Signed)
Virtual Visit Consent   Molly Rose, you are scheduled for a virtual visit with a [facility] provider today.     Just as with appointments in the office, your consent must be obtained to participate.  Your consent will be active for this visit and any virtual visit you may have with one of our providers in the next 365 days.     If you have a MyChart account, a copy of this consent can be sent to you electronically.  All virtual visits are billed to your insurance company just like a traditional visit in the office.    As this is a virtual visit, video technology does not allow for your provider to perform a traditional examination.  This may limit your provider's ability to fully assess your condition.  If your provider identifies any concerns that need to be evaluated in person or the need to arrange testing (such as labs, EKG, etc.), we will make arrangements to do so.     Although advances in technology are sophisticated, we cannot ensure that it will always work on either your end or our end.  If the connection with a video visit is poor, the visit may have to be switched to a telephone visit.  With either a video or telephone visit, we are not always able to ensure that we have a secure connection.     I need to obtain your verbal consent now.   Are you willing to proceed with your visit today?    [place] has provided verbal consent on 07/23/2021 for a virtual visit (video or telephone).   Apolonio Schneiders, FNP   Date: 07/23/2021 4:01 PM   Virtual Visit via Video Note   I, Apolonio Schneiders, connected with  Hazelene Doten  ([id number], March 15, 2001) on 07/23/21 at  4:00 PM EST by a video-enabled telemedicine application and verified that I am speaking with the correct person using two identifiers.  Location: Patient: Virtual Visit Location Patient: Home Provider: Virtual Visit Location Provider: Home Office   I discussed the limitations of evaluation and management by  telemedicine and the availability of in person appointments. The patient expressed understanding and agreed to proceed.    History of Present Illness: Molly Rose Council is a 21 y.o. who identifies as a female who was assigned female at birth, and is being seen today with complaints of increased pain even when laying down. The pain feels like cramping that happens with certain movements.   She was most recently at her OBGYN on February 1st.  She has not felt the baby move yet This is her first pregnancy   Denies back pain.  She has been having discharge that is white.   She has been checking her weight and and blood pressure at home.   The cramping that she feels does not make her nauseated.  She gets relief from rubbing her abdomen.   She has a hard time getting comfortable at night.   The pain she is experiencing has been present for the past 3 days and is worse when she lays down and turns over she feels a cramp.   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   Supervision of normal first pregnancy, antepartum 06/22/2021   Migraine without  aura and without status migrainosus, not intractable 53/20/2334   Complicated migraine 35/68/6168   Episodic tension-type headache 09/18/2015    Allergies:  Allergies  Allergen Reactions   Other     Seasonal Allergies   Medications:  Current Outpatient Medications:    Blood Pressure Monitoring (BLOOD PRESSURE KIT) DEVI, 1 kit by Does not apply route as needed., Disp: 1 each, Rfl: 0   dicyclomine (BENTYL) 20 MG tablet, Take 1 tablet (20 mg total) by mouth 2 (two) times daily for 7 days., Disp: 14 tablet, Rfl: 0   docusate sodium (COLACE) 100 MG capsule, Take 100 mg by mouth 2 (two) times daily. (Patient not taking: Reported on 06/22/2021), Disp: , Rfl:    docusate sodium  (COLACE) 100 MG capsule, Take 1 capsule (100 mg total) by mouth 2 (two) times daily as needed., Disp: 30 capsule, Rfl: 0   Doxylamine-Pyridoxine (DICLEGIS) 10-10 MG TBEC, Take 2 tabs at bedtime. If needed, add another tab in the morning. If needed, add another tab in the afternoon, up to 4 tabs/day. (Patient not taking: Reported on 06/22/2021), Disp: 30 tablet, Rfl: 0   guaiFENesin (MUCINEX) 600 MG 12 hr tablet, Take 1 tablet (600 mg total) by mouth 2 (two) times daily as needed for cough. (Patient not taking: Reported on 06/22/2021), Disp: 30 tablet, Rfl: 1   magnesium hydroxide (MILK OF MAGNESIA) 400 MG/5ML suspension, Take 15 mLs by mouth daily as needed for mild constipation. (Patient not taking: Reported on 06/22/2021), Disp: 355 mL, Rfl: 0   Misc. Devices (GOJJI WEIGHT SCALE) MISC, 1 Device by Does not apply route as needed., Disp: 1 each, Rfl: 0   oseltamivir (TAMIFLU) 75 MG capsule, Take 1 capsule (75 mg total) by mouth every 12 (twelve) hours. (Patient not taking: Reported on 06/22/2021), Disp: 10 capsule, Rfl: 0   polyethylene glycol powder (GLYCOLAX/MIRALAX) 17 GM/SCOOP powder, Take 17 g by mouth daily. Can increase to twice daily if needed for soft stool daily/every other day. (Patient not taking: Reported on 06/27/2021), Disp: 500 g, Rfl: 0   Prenatal Vit-Fe Fumarate-FA (PRENATAL MULTIVITAMIN) TABS tablet, Take 1 tablet by mouth daily at 12 noon. (Patient not taking: Reported on 06/27/2021), Disp: , Rfl:    Prenatal Vit-Fe Fumarate-FA (PRENATAL VITAMIN) 27-0.8 MG TABS, Take 1 tablet by mouth daily., Disp: 30 tablet, Rfl: 12  Observations/Objective: Patient is well-developed, well-nourished in no acute distress.  Resting comfortably at home.  Head is normocephalic, atraumatic.  No labored breathing.  Speech is clear and coherent with logical content.  Patient is alert and oriented at baseline.    Assessment and Plan: 1. Generalized abdominal pain Due to recent UTI and recurrent pain advised  patient has follow up with OBGYN to recheck urine If unable to be seen today she has been advised to be [facility] at [facility] for urinalysis   Patient is agreeable to plan      Follow Up Instructions: I discussed the assessment and treatment plan with the patient. The patient was provided an opportunity to ask questions and all were answered. The patient agreed with the plan and demonstrated an understanding of the instructions.  A copy of instructions were sent to the patient via MyChart unless otherwise noted below.    The patient was advised to call back or seek an in-person evaluation if the symptoms worsen or if the condition fails to improve as anticipated.  Time:  I spent 10 minutes with the patient via telehealth technology discussing the above problems/concerns.  Apolonio Schneiders, FNP

## 2021-07-24 ENCOUNTER — Other Ambulatory Visit: Payer: Self-pay

## 2021-07-24 ENCOUNTER — Ambulatory Visit
Admission: EM | Admit: 2021-07-24 | Discharge: 2021-07-24 | Disposition: A | Discharge status: Home / Self Care | Payer: Medicaid Other | Attending: Emergency Medicine | Admitting: Emergency Medicine

## 2021-07-24 DIAGNOSIS — Z87440 Personal history of urinary (tract) infections: Secondary | ICD-10-CM | POA: Diagnosis not present

## 2021-07-24 DIAGNOSIS — Z3A16 16 weeks gestation of pregnancy: Secondary | ICD-10-CM | POA: Diagnosis not present

## 2021-07-24 DIAGNOSIS — R109 Unspecified abdominal pain: Secondary | ICD-10-CM | POA: Diagnosis not present

## 2021-07-24 LAB — POCT URINALYSIS DIP (MANUAL ENTRY)
Bilirubin, UA: NEGATIVE
Blood, UA: NEGATIVE
Glucose, UA: NEGATIVE mg/dL
Ketones, POC UA: NEGATIVE mg/dL
Nitrite, UA: NEGATIVE
Protein Ur, POC: NEGATIVE mg/dL
Spec Grav, UA: 1.015 (ref 1.010–1.025)
Urobilinogen, UA: 0.2 E.U./dL
pH, UA: 8.5 — AB (ref 5.0–8.0)

## 2021-07-24 NOTE — ED Provider Notes (Signed)
[facility]    CSN: [id number] Arrival date & time: 07/24/21  1352    HISTORY   Chief Complaint  Patient presents with   Abdominal Pain   HPI Molly Rose is a 21 y.o. female. Pt had intermittent abd pains for 3-4 days. Reports when standing it is mid lower abd pains, but when laying down it is more right lateral side.  Pt adds that she had virtual appt with her family medicine provider, per EMR FNP provider her that since she recently had a UTI, she should go to urgent care to have a urinalysis performed.  Urine dip today showed an elevated pH but was otherwise unremarkable.   Past Medical History:  Diagnosis Date   Medical history non-contributory    Patient Active Problem List   Diagnosis Date Noted   Carrier of spinal muscular atrophy 07/14/2021   Supervision of normal first pregnancy, antepartum 06/22/2021   Migraine without aura and without status migrainosus, not intractable 63/05/6008   Complicated migraine 93/23/5573   Episodic tension-type headache 09/18/2015   Past Surgical History:  Procedure Laterality Date   NO PAST SURGERIES     OB History     Gravida  1   Para      Term      Preterm      AB      Living  0      SAB      IAB      Ectopic      Multiple      Live Births             Home Medications    Prior to Admission medications   Medication Sig Start Date End Date Taking? Authorizing Provider  Blood Pressure Monitoring (BLOOD PRESSURE KIT) DEVI 1 kit by Does not apply route as needed. 06/22/21   Woodroe Mode, MD  dicyclomine (BENTYL) 20 MG tablet Take 1 tablet (20 mg total) by mouth 2 (two) times daily for 7 days. 07/12/21 07/19/21  Lannie Fields, PA-C  docusate sodium (COLACE) 100 MG capsule Take 100 mg by mouth 2 (two) times daily. Patient not taking: Reported on 06/22/2021    [provider]  docusate sodium (COLACE) 100 MG capsule Take 1 capsule (100 mg total) by mouth 2 (two) times daily as  needed. 07/12/21   Constant, Peggy, MD  Doxylamine-Pyridoxine (DICLEGIS) 10-10 MG TBEC Take 2 tabs at bedtime. If needed, add another tab in the morning. If needed, add another tab in the afternoon, up to 4 tabs/day. Patient not taking: Reported on 06/22/2021 05/16/21   Patriciaann Clan, DO  guaiFENesin (MUCINEX) 600 MG 12 hr tablet Take 1 tablet (600 mg total) by mouth 2 (two) times daily as needed for cough. Patient not taking: Reported on 06/22/2021 06/12/21   Seabron Spates, CNM  magnesium hydroxide (MILK OF MAGNESIA) 400 MG/5ML suspension Take 15 mLs by mouth daily as needed for mild constipation. Patient not taking: Reported on 06/22/2021 06/04/21   Sharion Balloon, FNP  Misc. Devices (GOJJI WEIGHT SCALE) MISC 1 Device by Does not apply route as needed. 06/22/21   Woodroe Mode, MD  oseltamivir (TAMIFLU) 75 MG capsule Take 1 capsule (75 mg total) by mouth every 12 (twelve) hours. Patient not taking: Reported on 06/22/2021 06/12/21   Seabron Spates, CNM  polyethylene glycol powder (GLYCOLAX/MIRALAX) 17 GM/SCOOP powder Take 17 g by mouth daily. Can increase to twice daily if needed for  soft stool daily/every other day. Patient not taking: Reported on 06/27/2021 05/16/21   Patriciaann Clan, DO  Prenatal Vit-Fe Fumarate-FA (PRENATAL MULTIVITAMIN) TABS tablet Take 1 tablet by mouth daily at 12 noon. Patient not taking: Reported on 06/27/2021    [provider]  Prenatal Vit-Fe Fumarate-FA (PRENATAL VITAMIN) 27-0.8 MG TABS Take 1 tablet by mouth daily. 06/12/21   Seabron Spates, CNM    Family History Family History  Problem Relation Age of Onset   Healthy Mother    Healthy Father    Social History Social History   Tobacco Use   Smoking status: Never   Smokeless tobacco: Never  Vaping Use   Vaping Use: Former  Substance Use Topics   Alcohol use: No    Alcohol/week: 0.0 standard drinks   Drug use: No   Allergies   Other  Review of Systems Review of Systems Pertinent  findings noted in history of present illness.   Physical Exam Triage Vital Signs ED Triage Vitals  Enc Vitals Group     BP 03/23/21 0827 (!) 147/82     Pulse Rate 03/23/21 0827 72     Resp 03/23/21 0827 18     Temp 03/23/21 0827 98.3 F (36.8 C)     Temp Source 03/23/21 0827 Oral     SpO2 03/23/21 0827 98 %     Weight --      Height --      Head Circumference --      Peak Flow --      Pain Score 03/23/21 0826 5     Pain Loc --      Pain Edu? --      Excl. in [place]? --   No data found.  Updated Vital Signs BP 97/64 (BP Location: Left Arm)    Pulse 76    Temp 99.3 F (37.4 C) (Oral)    Resp 14    LMP 03/29/2021    SpO2 97%   Physical Exam Vitals and nursing note reviewed.  Constitutional:      General: She is not in acute distress.    Appearance: Normal appearance. She is not ill-appearing.  HENT:     Head: Normocephalic and atraumatic.  Eyes:     General: Lids are normal.        Right eye: No discharge.        Left eye: No discharge.     Extraocular Movements: Extraocular movements intact.     Conjunctiva/sclera: Conjunctivae normal.     Right eye: Right conjunctiva is not injected.     Left eye: Left conjunctiva is not injected.  Neck:     Trachea: Trachea and phonation normal.  Cardiovascular:     Rate and Rhythm: Normal rate and regular rhythm.     Pulses: Normal pulses.     Heart sounds: Normal heart sounds. No murmur heard.   No friction rub. No gallop.  Pulmonary:     Effort: Pulmonary effort is normal. No accessory muscle usage, prolonged expiration or respiratory distress.     Breath sounds: Normal breath sounds. No stridor, decreased air movement or transmitted upper airway sounds. No decreased breath sounds, wheezing, rhonchi or rales.  Chest:     Chest wall: No tenderness.  Abdominal:     General: Abdomen is flat. Bowel sounds are normal. There is no distension.     Palpations: Abdomen is soft.     Tenderness: There is generalized abdominal tenderness.  There is no  right CVA tenderness or left CVA tenderness.     Hernia: No hernia is present.  Musculoskeletal:        General: Normal range of motion.     Cervical back: Normal range of motion and neck supple. Normal range of motion.  Lymphadenopathy:     Cervical: No cervical adenopathy.  Skin:    General: Skin is warm and dry.     Findings: No erythema or rash.  Neurological:     General: No focal deficit present.     Mental Status: She is alert and oriented to person, place, and time.  Psychiatric:        Mood and Affect: Mood normal.        Behavior: Behavior normal.    Visual Acuity Right Eye Distance:   Left Eye Distance:   Bilateral Distance:    Right Eye Near:   Left Eye Near:    Bilateral Near:     UC Couse / Diagnostics / Procedures:    EKG  Radiology No results found.  Procedures Procedures (including critical care time)  UC Diagnoses / Final Clinical Impressions(s)   I have reviewed the triage vital signs and the nursing notes.  Pertinent labs & imaging results that were available during my care of the patient were reviewed by me and considered in my medical decision making (see chart for details).    Final diagnoses:  Abdominal pain, unspecified abdominal location  [redacted] weeks gestation of pregnancy  History of recurrent UTI (urinary tract infection)   Patient advised to go to the MAU for further evaluation. ED Prescriptions   None    PDMP not reviewed this encounter.  Pending results:  Labs Reviewed  POCT URINALYSIS DIP (MANUAL ENTRY) - Abnormal; Notable for the following components:      Result Value   Clarity, UA cloudy (*)    pH, UA 8.5 (*)    Leukocytes, UA Trace (*)    All other components within normal limits    Medications Ordered in UC: Medications - No data to display  Disposition Upon Discharge:  Condition: stable for discharge home Home: take medications as prescribed; routine discharge instructions as discussed; follow up as  advised.  Patient presented with an acute illness with associated systemic symptoms and significant discomfort requiring urgent management. In my opinion, this is a condition that a prudent lay person (someone who possesses an average knowledge of health and medicine) may potentially expect to result in complications if not addressed urgently such as respiratory distress, impairment of bodily function or dysfunction of bodily organs.   Routine symptom specific, illness specific and/or disease specific instructions were discussed with the patient and/or caregiver at length.   As such, the patient has been evaluated and assessed, work-up was performed and treatment was provided in alignment with urgent care protocols and evidence based medicine.  Patient/parent/caregiver has been advised that the patient may require follow up for further testing and treatment if the symptoms continue in spite of treatment, as clinically indicated and appropriate.  If the patient was tested for COVID-19, Influenza and/or RSV, then the patient/parent/guardian was advised to isolate at home pending the results of his/her diagnostic coronavirus test and potentially longer if theyre positive. I have also advised pt that if his/her COVID-19 test returns positive, it's recommended to self-isolate for at least 10 days after symptoms first appeared AND until fever-free for 24 hours without fever reducer AND other symptoms have improved or resolved. Discussed self-isolation recommendations as  well as instructions for household member/close contacts as per the CDC and [place] DHHS, and also gave patient the [place] packet with this information.  Patient/parent/caregiver has been advised to return to the [facility] or PCP in 3-5 days if no better; to PCP or the Emergency Department if new signs and symptoms develop, or if the current signs or symptoms continue to change or worsen for further workup, evaluation and treatment as clinically indicated  and appropriate  The patient will follow up with their current PCP if and as advised. If the patient does not currently have a PCP we will assist them in obtaining one.   The patient may need specialty follow up if the symptoms continue, in spite of conservative treatment and management, for further workup, evaluation, consultation and treatment as clinically indicated and appropriate.   Patient/parent/caregiver verbalized understanding and agreement of plan as discussed.  All questions were addressed during visit.  Please see discharge instructions below for further details of plan.  Discharge Instructions:   Discharge Instructions      Your urinalysis today is slightly abnormal but not sufficiently diagnostic to warrant antibiotics at this time.  I have sent your urine out for culture to identify any possible bacteria present in your urine.  Unfortunately this takes 3 to 5 days and if your urine culture is positive that means that you have waited 3 to 5 days before being treated for an infection that could be dangerous to your baby.  Recommend that you go to the women's emergency room, MAU, now for further evaluation of your abdominal pain..      This office note has been dictated using Dragon speech recognition software.  Unfortunately, and despite my best efforts, this method of dictation can sometimes lead to occasional typographical or grammatical errors.  I apologize in advance if this occurs.     Lynden Oxford Scales, [place] 07/25/21 [phone]

## 2021-07-24 NOTE — Discharge Instructions (Addendum)
Your urinalysis today is slightly abnormal but not sufficiently diagnostic to warrant antibiotics at this time.  I have sent your urine out for culture to identify any possible bacteria present in your urine.  Unfortunately this takes 3 to 5 days and if your urine culture is positive that means that you have waited 3 to 5 days before being treated for an infection that could be dangerous to your baby.  Recommend that you go to the women's emergency room, MAU, now for further evaluation of your abdominal pain.Marland Kitchen

## 2021-07-24 NOTE — ED Triage Notes (Signed)
Pt had intermittent abd pains for 3-4 days. Reports when standing it is mid lower abd pains, but when laying down it is more right lateral side.  Pt adds that she had virtual appt and was advised since had recent UTI that should go to UC to have urine specimen recheck.

## 2021-07-25 ENCOUNTER — Other Ambulatory Visit: Payer: Self-pay

## 2021-07-25 ENCOUNTER — Inpatient Hospital Stay (HOSPITAL_COMMUNITY)
Admission: AD | Admit: 2021-07-25 | Discharge: 2021-07-26 | Disposition: A | Discharge status: Home / Self Care | Payer: Medicaid Other | Attending: Obstetrics & Gynecology | Admitting: Obstetrics & Gynecology

## 2021-07-25 ENCOUNTER — Encounter (HOSPITAL_COMMUNITY): Payer: Self-pay | Admitting: Obstetrics & Gynecology

## 2021-07-25 DIAGNOSIS — Z87440 Personal history of urinary (tract) infections: Secondary | ICD-10-CM | POA: Diagnosis not present

## 2021-07-25 DIAGNOSIS — N390 Urinary tract infection, site not specified: Secondary | ICD-10-CM | POA: Insufficient documentation

## 2021-07-25 DIAGNOSIS — Z3A16 16 weeks gestation of pregnancy: Secondary | ICD-10-CM | POA: Insufficient documentation

## 2021-07-25 DIAGNOSIS — R1031 Right lower quadrant pain: Secondary | ICD-10-CM | POA: Insufficient documentation

## 2021-07-25 DIAGNOSIS — T782XXA Anaphylactic shock, unspecified, initial encounter: Secondary | ICD-10-CM | POA: Diagnosis not present

## 2021-07-25 DIAGNOSIS — O99612 Diseases of the digestive system complicating pregnancy, second trimester: Secondary | ICD-10-CM | POA: Insufficient documentation

## 2021-07-25 DIAGNOSIS — O2342 Unspecified infection of urinary tract in pregnancy, second trimester: Secondary | ICD-10-CM | POA: Diagnosis not present

## 2021-07-25 DIAGNOSIS — K5900 Constipation, unspecified: Secondary | ICD-10-CM | POA: Insufficient documentation

## 2021-07-25 LAB — CBC WITH DIFFERENTIAL/PLATELET
Abs Immature Granulocytes: 0.05 10*3/uL (ref 0.00–0.07)
Basophils Absolute: 0 10*3/uL (ref 0.0–0.1)
Basophils Relative: 0 %
Eosinophils Absolute: 0.1 10*3/uL (ref 0.0–0.5)
Eosinophils Relative: 1 %
HCT: 32.7 % — ABNORMAL LOW (ref 36.0–46.0)
Hemoglobin: 11.1 g/dL — ABNORMAL LOW (ref 12.0–15.0)
Immature Granulocytes: 1 %
Lymphocytes Relative: 16 %
Lymphs Abs: 1.5 10*3/uL (ref 0.7–4.0)
MCH: 31.3 pg (ref 26.0–34.0)
MCHC: 33.9 g/dL (ref 30.0–36.0)
MCV: 92.1 fL (ref 80.0–100.0)
Monocytes Absolute: 0.6 10*3/uL (ref 0.1–1.0)
Monocytes Relative: 7 %
Neutro Abs: 7.3 10*3/uL (ref 1.7–7.7)
Neutrophils Relative %: 75 %
Platelets: 214 10*3/uL (ref 150–400)
RBC: 3.55 MIL/uL — ABNORMAL LOW (ref 3.87–5.11)
RDW: 13.5 % (ref 11.5–15.5)
WBC: 9.6 10*3/uL (ref 4.0–10.5)
nRBC: 0 % (ref 0.0–0.2)

## 2021-07-25 LAB — URINALYSIS, ROUTINE W REFLEX MICROSCOPIC
Bilirubin Urine: NEGATIVE
Glucose, UA: NEGATIVE mg/dL
Hgb urine dipstick: NEGATIVE
Ketones, ur: NEGATIVE mg/dL
Leukocytes,Ua: NEGATIVE
Nitrite: POSITIVE — AB
Protein, ur: NEGATIVE mg/dL
Specific Gravity, Urine: 1.015 (ref 1.005–1.030)
pH: 8 (ref 5.0–8.0)

## 2021-07-25 MED ORDER — EPINEPHRINE 0.3 MG/0.3ML IJ SOAJ: 0.3000 mg | INTRAMUSCULAR | 1 refills | Status: DC | PRN

## 2021-07-25 MED ORDER — METHYLPREDNISOLONE SODIUM SUCC 125 MG IJ SOLR
INTRAMUSCULAR | Status: AC
  Administered 2021-07-25: 125 mg
  Filled 2021-07-25: qty 2

## 2021-07-25 MED ORDER — CEFTRIAXONE SODIUM 1 G IJ SOLR
1.0000 g | Freq: Once | INTRAMUSCULAR | Status: AC
  Administered 2021-07-25: 1 g via INTRAMUSCULAR
  Filled 2021-07-25: qty 10

## 2021-07-25 MED ORDER — IBUPROFEN 600 MG PO TABS
600.0000 mg | ORAL_TABLET | Freq: Once | ORAL | Status: AC
  Administered 2021-07-25: 600 mg via ORAL
  Filled 2021-07-25: qty 1

## 2021-07-25 MED ORDER — FAMOTIDINE IN NACL 20-0.9 MG/50ML-% IV SOLN
INTRAVENOUS | Status: AC
  Administered 2021-07-25: 20 mg
  Filled 2021-07-25: qty 50

## 2021-07-25 MED ORDER — SODIUM CHLORIDE 0.9 % IV SOLN
1.0000 g | Freq: Once | INTRAVENOUS | Status: DC
  Filled 2021-07-25: qty 10

## 2021-07-25 MED ORDER — EPINEPHRINE 0.3 MG/0.3ML IJ SOAJ
INTRAMUSCULAR | Status: AC
  Administered 2021-07-25: 0.3 mg
  Filled 2021-07-25: qty 0.3

## 2021-07-25 MED ORDER — DIPHENHYDRAMINE HCL 50 MG/ML IJ SOLN
INTRAMUSCULAR | Status: AC
  Administered 2021-07-25: 50 mg
  Filled 2021-07-25: qty 1

## 2021-07-25 MED ORDER — NITROFURANTOIN MONOHYD MACRO 100 MG PO CAPS
100.0000 mg | ORAL_CAPSULE | Freq: Every day | ORAL | 5 refills | Status: DC
Start: 2021-07-25 — End: 2021-08-23

## 2021-07-25 MED ORDER — SULFAMETHOXAZOLE-TRIMETHOPRIM 800-160 MG PO TABS: 1.0000 | ORAL_TABLET | Freq: Two times a day (BID) | ORAL | 0 refills | Status: AC

## 2021-07-25 NOTE — MAU Provider Note (Cosign Needed Addendum)
History     CSN: [id number]  Arrival date and time: 07/25/21 1553   Event Date/Time   First Provider Initiated Contact with Patient 07/25/21 1659      Chief Complaint  Patient presents with   Abdominal Pain   HPI  Molly Rose is a 21 y.o. female G1P0 @ 68w6dhere in MAU with complaints lower abdominal pain, and back pain. The abdominal pain started 3 days ago and the back pain started yesterday. The abdominal pain feels similar to the pain she had last month when she was diagnosed with a UTI. She reports urgency and frequency. She did complete her courses of antibiotics. This is her 3rd UTI in this pregnancy. She denies fever.   OB History     Gravida  1   Para      Term      Preterm      AB      Living  0      SAB      IAB      Ectopic      Multiple      Live Births              Past Medical History:  Diagnosis Date   Medical history non-contributory     Past Surgical History:  Procedure Laterality Date   WISDOM TOOTH EXTRACTION      Family History  Problem Relation Age of Onset   Healthy Mother    Healthy Father     Social History   Tobacco Use   Smoking status: Never   Smokeless tobacco: Never  Vaping Use   Vaping Use: Former  Substance Use Topics   Alcohol use: No    Alcohol/week: 0.0 standard drinks   Drug use: No    Allergies:  Allergies  Allergen Reactions   Other     Seasonal Allergies    Medications Prior to Admission  Medication Sig Dispense Refill Last Dose   Prenatal Vit-Fe Fumarate-FA (PRENATAL VITAMIN) 27-0.8 MG TABS Take 1 tablet by mouth daily. 30 tablet 12 07/24/2021   Blood Pressure Monitoring (BLOOD PRESSURE KIT) DEVI 1 kit by Does not apply route as needed. 1 each 0    dicyclomine (BENTYL) 20 MG tablet Take 1 tablet (20 mg total) by mouth 2 (two) times daily for 7 days. 14 tablet 0    docusate sodium (COLACE) 100 MG capsule Take 1 capsule (100 mg total) by mouth 2 (two) times daily as needed. 30  capsule 0    Misc. Devices (GOJJI WEIGHT SCALE) MISC 1 Device by Does not apply route as needed. 1 each 0    Results for orders placed or performed during the hospital encounter of 07/25/21 (from the past 48 hour(s))  Urinalysis, Routine w reflex microscopic Urine, Clean Catch     Status: Abnormal   Collection Time: 07/25/21  3:53 PM  Result Value Ref Range   Color, Urine YELLOW YELLOW   APPearance CLOUDY (A) CLEAR   Specific Gravity, Urine 1.015 1.005 - 1.030   pH 8.0 5.0 - 8.0   Glucose, UA NEGATIVE NEGATIVE mg/dL   Hgb urine dipstick NEGATIVE NEGATIVE   Bilirubin Urine NEGATIVE NEGATIVE   Ketones, ur NEGATIVE NEGATIVE mg/dL   Protein, ur NEGATIVE NEGATIVE mg/dL   Nitrite POSITIVE (A) NEGATIVE   Leukocytes,Ua NEGATIVE NEGATIVE   RBC / HPF 0-5 0 - 5 RBC/hpf   WBC, UA 6-10 0 - 5 WBC/hpf   Bacteria, UA MANY (A)  NONE SEEN   Squamous Epithelial / LPF 0-5 0 - 5   Mucus PRESENT    Amorphous Crystal PRESENT     Comment: Performed at [facility], [place] [address], [place], [place] [address]  CBC with Differential/Platelet     Status: Abnormal   Collection Time: 07/25/21  5:53 PM  Result Value Ref Range   WBC 9.6 4.0 - 10.5 K/uL   RBC 3.55 (L) 3.87 - 5.11 MIL/uL   Hemoglobin 11.1 (L) 12.0 - 15.0 g/dL   HCT 32.7 (L) 36.0 - 46.0 %   MCV 92.1 80.0 - 100.0 fL   MCH 31.3 26.0 - 34.0 pg   MCHC 33.9 30.0 - 36.0 g/dL   RDW 13.5 11.5 - 15.5 %   Platelets 214 150 - 400 K/uL   nRBC 0.0 0.0 - 0.2 %   Neutrophils Relative % 75 %   Neutro Abs 7.3 1.7 - 7.7 K/uL   Lymphocytes Relative 16 %   Lymphs Abs 1.5 0.7 - 4.0 K/uL   Monocytes Relative 7 %   Monocytes Absolute 0.6 0.1 - 1.0 K/uL   Eosinophils Relative 1 %   Eosinophils Absolute 0.1 0.0 - 0.5 K/uL   Basophils Relative 0 %   Basophils Absolute 0.0 0.0 - 0.1 K/uL   Immature Granulocytes 1 %   Abs Immature Granulocytes 0.05 0.00 - 0.07 K/uL    Comment: Performed at [facility], [address] [place], [place] [address]     Review of Systems  Gastrointestinal:  Positive for abdominal pain.  Genitourinary:  Positive for flank pain. Negative for vaginal bleeding and vaginal discharge.  Musculoskeletal:  Positive for back pain.  Physical Exam   Blood pressure (!) 106/53, pulse 79, temperature 99.3 F (37.4 C), temperature source Oral, resp. rate 15, height 5' (1.524 m), weight 54.4 kg, last menstrual period 03/29/2021, SpO2 97 %.  Patient Vitals for the past 24 hrs:  BP Temp Temp src Pulse Resp SpO2 Height Weight  07/25/21 2359 (!) 96/47 98.2 F (36.8 C) Oral 64 -- -- -- --  07/25/21 2330 -- -- -- -- -- 99 % -- --  07/25/21 2300 -- -- -- -- -- 100 % -- --  07/25/21 2100 -- -- -- -- -- 99 % -- --  07/25/21 1853 (!) 98/50 -- -- 68 15 98 % -- --  07/25/21 1852 (!) 103/46 -- -- 65 -- -- -- --  07/25/21 1639 (!) 99/51 -- -- 82 -- -- -- --  07/25/21 1626 (!) 106/53 99.3 F (37.4 C) Oral 79 15 97 % -- --  07/25/21 1621 -- -- -- -- -- -- 5' (1.524 m) 54.4 kg    Physical Exam Constitutional:      General: She is not in acute distress.    Appearance: Normal appearance. She is not ill-appearing, toxic-appearing or diaphoretic.  Pulmonary:     Effort: Pulmonary effort is normal. No respiratory distress.  Abdominal:     Tenderness: There is no abdominal tenderness. There is right CVA tenderness. There is no guarding or rebound.  Skin:    General: Skin is warm.  Neurological:     Mental Status: She is alert and oriented to person, place, and time.  Psychiatric:        Mood and Affect: Mood normal.        Behavior: Behavior normal.   MAU Course  Procedures Results for orders placed or performed during the hospital encounter of 07/25/21 (from  the past 24 hour(s))  Urinalysis, Routine w reflex microscopic Urine, Clean Catch     Status: Abnormal   Collection Time: 07/25/21  3:53 PM  Result Value Ref Range   Color, Urine YELLOW YELLOW   APPearance CLOUDY (A) CLEAR   Specific Gravity, Urine 1.015 1.005 - 1.030    pH 8.0 5.0 - 8.0   Glucose, UA NEGATIVE NEGATIVE mg/dL   Hgb urine dipstick NEGATIVE NEGATIVE   Bilirubin Urine NEGATIVE NEGATIVE   Ketones, ur NEGATIVE NEGATIVE mg/dL   Protein, ur NEGATIVE NEGATIVE mg/dL   Nitrite POSITIVE (A) NEGATIVE   Leukocytes,Ua NEGATIVE NEGATIVE   RBC / HPF 0-5 0 - 5 RBC/hpf   WBC, UA 6-10 0 - 5 WBC/hpf   Bacteria, UA MANY (A) NONE SEEN   Squamous Epithelial / LPF 0-5 0 - 5   Mucus PRESENT    Amorphous Crystal PRESENT   CBC with Differential/Platelet     Status: Abnormal   Collection Time: 07/25/21  5:53 PM  Result Value Ref Range   WBC 9.6 4.0 - 10.5 K/uL   RBC 3.55 (L) 3.87 - 5.11 MIL/uL   Hemoglobin 11.1 (L) 12.0 - 15.0 g/dL   HCT 32.7 (L) 36.0 - 46.0 %   MCV 92.1 80.0 - 100.0 fL   MCH 31.3 26.0 - 34.0 pg   MCHC 33.9 30.0 - 36.0 g/dL   RDW 13.5 11.5 - 15.5 %   Platelets 214 150 - 400 K/uL   nRBC 0.0 0.0 - 0.2 %   Neutrophils Relative % 75 %   Neutro Abs 7.3 1.7 - 7.7 K/uL   Lymphocytes Relative 16 %   Lymphs Abs 1.5 0.7 - 4.0 K/uL   Monocytes Relative 7 %   Monocytes Absolute 0.6 0.1 - 1.0 K/uL   Eosinophils Relative 1 %   Eosinophils Absolute 0.1 0.0 - 0.5 K/uL   Basophils Relative 0 %   Basophils Absolute 0.0 0.0 - 0.1 K/uL   Immature Granulocytes 1 %   Abs Immature Granulocytes 0.05 0.00 - 0.07 K/uL     MDM  UA with nitrite + Urine culture pending CBC with diff Reviewed patient with Dr. Roselie Awkward. Will given IM rocephin prior to DC, start her on bactrim for treatment, then Macrobid daily for suppression. Patient is well appearing outside of right flank pain and + urine dip. She is afebrile with normal WBC's.  RN urgently requested NP presence in patient's room, shortly after rocephin injection. Patient with marked facial hives, SOB, and anaphylaxis reaction to rocephin.  Rapid response called  0.3 cc epi given IM, benadryl 50 mg IM, Solumedrol 125 mcg IV, and Pepcid IV Dr. Roselie Awkward at bedside. Patient immediatly felt relief with  breathing. Patient saturations down to 88 and nasal canula was placed at 4 liters. Patient's condition improved following treatment. She will be kept in MAU for observation and admitted if needed. Report given to Jorje Guild NP who assumes care of the patient.   Lezlie Lye, NP 07/25/2021 8:27 PM  Discussed patient's care with PCCM provider & EDP. Recommends monitoring for 4 hours for possible rebound reaction.  Patient monitored for 4 hours after reaction.  Reports no further symptoms.  Oxygen saturation remains 99 to 100% on room air. We will discharge home with antibiotics for recurrent UTI and an EpiPen to have on hand as needed.  Assessment and Plan   1. UTI in pregnancy, second trimester  -Rx bactrim x 3 days followed by daily macrobid for supression  2. Anaphylaxis, initial encounter  -rx epipen -Cephalosporin allergy added to chart  3. [redacted] weeks gestation of pregnancy    Jorje Guild, NP

## 2021-07-25 NOTE — Significant Event (Addendum)
Rapid Response Event Note  ? ?Reason for Call :  ?Allergic reaction to IM rocephin.  ? ?Initial Focused Assessment:  ?Pt lying in bed with eyes open. She is alert and oriented, c/o tightness in throat and numbness/tingling in her B hands. She has hives on her face. She is able to swallow and has no stridor. Her lungs are clear t/o. Skin warm and dry. ? ?HR-67, SBP-90s, RR-16, SpO2-95% on 4L [place]. ? ?Prior to my arrival, pt's SpO2-87% on RA.  ? ?Interventions:  ?Benadryl 50mg  IV ?0.3mg  EPI IM ?Solumedrol 125mg  IV ?Pepcid 20mg   IV ?Plan of Care:  ?Pt says her throat feels better after interventions. Hives are better as well. SpO2-100% on RA. Pt instructed about reasons to call RN. Continue to monitor pt closely. Call RRT if further assistance needed.  ? ?Event Summary:  ? ?MD Notified: PA at bedside on my arrival ?Call Time:1950 ?Arrival ?End Time:2015 ? ? , RN ?

## 2021-07-25 NOTE — MAU Provider Note (Signed)
?History  ? ? ?CSN: [id number] ? ?Arrival date and time: 07/25/21 1553 ? ? Event Date/Time  ? First Provider Initiated Contact with Patient 07/25/21 1659   ?  ? ?Chief Complaint  ?Patient presents with  ? Abdominal Pain  ? ?HPI ? Molly Rose is a 21 y.o. G1P0 at 37w6dhere today with RLQ abdominal pain for the past 3 days. The pain is intermittent. Each episode lasts about 10 minutes and the frequency of the episodes has been increasing. The pain occurs when she is laying down or standing up and gets worse with movement. Taking hot baths and rubbing the site of the pain makes it better. Additionally, she started having some mild back pain yesterday on the same side as the RLQ tenderness. She had a UTI in 06/12/21 that was treated with antibiotics. She is concerned the abdominal pain may be due to UTI recurrence. Today is her third UTI during this pregnancy. She confirms she completed the full course of antibiotics for the last infection. She has not been febrile and has had no recent discharge or spotting. She reports having intercourse 2-3 days ago. Additionally, the patient has had more recurring headaches since entering the second trimester. She takes tylenol for the headaches and that seems to help. The patient has been experiencing straining with bowel movements, yet her BM are diarrhea. She tried Miralax early in pregnancy when the constipation began but that caused diarrhea. She stopped taking Miralax 3 weeks ago but continues to have soft stools with straining. She recently discussed beginning a new stool softener with her FNP. She has concerns about her diet during pregnancy and is interested in hearing about foods she can eat that could improve her constipation.  ? ?OB History   ? ? Gravida  ?1  ? Para  ?   ? Term  ?   ? Preterm  ?   ? AB  ?   ? Living  ?0  ?  ? ? SAB  ?   ? IAB  ?   ? Ectopic  ?   ? Multiple  ?   ? Live Births  ?   ?   ?  ?  ? ? ?Past Medical History:  ?Diagnosis Date  ? Medical  history non-contributory   ? ? ?Past Surgical History:  ?Procedure Laterality Date  ? WISDOM TOOTH EXTRACTION    ? ? ?Family History  ?Problem Relation Age of Onset  ? Healthy Mother   ? Healthy Father   ? ? ?Social History  ? ?Tobacco Use  ? Smoking status: Never  ? Smokeless tobacco: Never  ?Vaping Use  ? Vaping Use: Former  ?Substance Use Topics  ? Alcohol use: No  ?  Alcohol/week: 0.0 standard drinks  ? Drug use: No  ? ? ?Allergies:  ?Allergies  ?Allergen Reactions  ? Other   ?  Seasonal Allergies  ? ? ?Medications Prior to Admission  ?Medication Sig Dispense Refill Last Dose  ? Prenatal Vit-Fe Fumarate-FA (PRENATAL VITAMIN) 27-0.8 MG TABS Take 1 tablet by mouth daily. 30 tablet 12 07/24/2021  ? Blood Pressure Monitoring (BLOOD PRESSURE KIT) DEVI 1 kit by Does not apply route as needed. 1 each 0   ? dicyclomine (BENTYL) 20 MG tablet Take 1 tablet (20 mg total) by mouth 2 (two) times daily for 7 days. 14 tablet 0   ? docusate sodium (COLACE) 100 MG capsule Take 1 capsule (100 mg total) by mouth 2 (two) times  daily as needed. 30 capsule 0   ? Misc. Devices (GOJJI WEIGHT SCALE) MISC 1 Device by Does not apply route as needed. 1 each 0   ? ? ?Review of Systems ?Physical Exam  ? ?Blood pressure (!) 106/53, pulse 79, temperature 99.3 ?F (37.4 ?C), temperature source Oral, resp. rate 15, height 5' (1.524 m), weight 54.4 kg, last menstrual period 03/29/2021, SpO2 97 %. ?Physical Exam ?Pulmonary:  ?   Effort: Pulmonary effort is normal.  ?Abdominal:  ?   Tenderness: There is generalized abdominal tenderness and tenderness in the right lower quadrant. There is right CVA tenderness.  ?Neurological:  ?   Mental Status: She is alert.  ?Psychiatric:     ?   Mood and Affect: Mood normal.  ? ? ?MAU Course  ?Procedures ? ?MDM ?Pt came in with intermittent RLQ pain for 3 days and back pain for 1 day. Urine dip was performed to assess for UTI recurrence. This is her 3rd UTI during this pregnancy. Pt had CVA tenderness on exam, CBC  was sent to check WBC count to assess for pyelonephritis. WBC count was normal. Will treat current UTI and prescribe prophylactic abx for the duration of this pregnancy. ? ?Assessment and Plan  ?1- RLQ and back pain: UA positive for nitrites indicates UTI recurrence. This is the pt's 3rd UTI during this pregnancy. Pt will be treated for current UTI. Additionally, will use UTI prophylaxis for the rest of the pregnancy to prevent another recurrence. ?2- Constipation- discussed home remedies to treat constipation/straining including increased water intake, increased dietary fiber, and colace.  ? ?Titus Dubin ?07/25/2021, 5:10 PM  ?

## 2021-07-25 NOTE — MAU Note (Signed)
Molly Rose is a 21 y.o. at [redacted]w[redacted]d here in MAU reporting: for the past 4 days has been having abdominal pain. States pain moves but generally is on the right side. Denies urinary symptoms. No vaginal bleeding or abnormal discharge. States pain does get worse when she is moving or changing positions.  ? ?Onset of complaint: ongoing ? ?Pain score: 4/10 ? ?Vitals:  ? 07/25/21 1626  ?BP: (!) 106/53  ?Pulse: 79  ?Resp: 15  ?Temp: 99.3 ?F (37.4 ?C)  ?SpO2: 97%  ?   ?FHT:154 ? ?Lab orders placed from triage: UA ? ?

## 2021-07-26 DIAGNOSIS — T782XXA Anaphylactic shock, unspecified, initial encounter: Secondary | ICD-10-CM

## 2021-07-26 DIAGNOSIS — Z3A16 16 weeks gestation of pregnancy: Secondary | ICD-10-CM

## 2021-07-26 DIAGNOSIS — O2342 Unspecified infection of urinary tract in pregnancy, second trimester: Secondary | ICD-10-CM

## 2021-07-28 LAB — CULTURE, OB URINE: Culture: >=100000 — AB

## 2021-07-30 ENCOUNTER — Ambulatory Visit (INDEPENDENT_AMBULATORY_CARE_PROVIDER_SITE_OTHER): Payer: Medicaid Other | Admitting: Advanced Practice Midwife

## 2021-07-30 ENCOUNTER — Other Ambulatory Visit: Payer: Self-pay

## 2021-07-30 VITALS — BP 107/64 | HR 80 | Wt 119.4 lb

## 2021-07-30 DIAGNOSIS — Z3400 Encounter for supervision of normal first pregnancy, unspecified trimester: Secondary | ICD-10-CM

## 2021-07-30 DIAGNOSIS — Z3A19 19 weeks gestation of pregnancy: Secondary | ICD-10-CM

## 2021-07-30 DIAGNOSIS — O2342 Unspecified infection of urinary tract in pregnancy, second trimester: Secondary | ICD-10-CM

## 2021-07-30 DIAGNOSIS — Z3A17 17 weeks gestation of pregnancy: Secondary | ICD-10-CM

## 2021-07-30 NOTE — Addendum Note (Signed)
Addended by: Lewie Loron D on: 07/30/2021 03:59 PM ? ? Modules accepted: Orders ? ?

## 2021-07-30 NOTE — Progress Notes (Signed)
? ?  PRENATAL VISIT NOTE ? ?Subjective:  ?Molly Rose is a 21 y.o. G1P0 at [redacted]w[redacted]d being seen today for ongoing prenatal care.  She is currently monitored for the following issues for this low-risk pregnancy and has Migraine without aura and without status migrainosus, not intractable; Complicated migraine; Episodic tension-type headache; Supervision of normal first pregnancy, antepartum; Carrier of spinal muscular atrophy; and UTI in pregnancy, second trimester on their problem list. ? ?Patient reports no complaints.  Contractions: Not present. Vag. Bleeding: None.  Movement: Present. Denies leaking of fluid.  ? ?The following portions of the patient's history were reviewed and updated as appropriate: allergies, current medications, past family history, past medical history, past social history, past surgical history and problem list.  ? ?Objective:  ? ?Vitals:  ? 07/30/21 1440  ?BP: 107/64  ?Pulse: 80  ?Weight: 119 lb 6.4 oz (54.2 kg)  ? ? ?Fetal Status: Fetal Heart Rate (bpm): 154   Movement: Present    ? ?General:  Alert, oriented and cooperative. Patient is in no acute distress.  ?Skin: Skin is warm and dry. No rash noted.   ?Cardiovascular: Normal heart rate noted  ?Respiratory: Normal respiratory effort, no problems with respiration noted  ?Abdomen: Soft, gravid, appropriate for gestational age.  Pain/Pressure: Absent     ?Pelvic: Cervical exam deferred        ?Extremities: Normal range of motion.  Edema: None  ?Mental Status: Normal mood and affect. Normal behavior. Normal judgment and thought content.  ? ?Assessment and Plan:  ?Pregnancy: G1P0 at [redacted]w[redacted]d ?1. UTI in pregnancy, second trimester ?--Pt to start Macrobid for suppression. Has not started. Discussed reasons for medication with UTI x 3 in pregnancy.  Macrobid is not related to Rocephin, as pt had anaphalaxis to IV Rocephin in MAU.   ?--Pt to start Macrobid QHS ?--Call office with UTI symptoms, or go to MAU if severe ? ?2. Supervision of normal  first pregnancy, antepartum ?--Anticipatory guidance about next visits/weeks of pregnancy given.  ?--FH just below umbilicus, wnl ?--Next appt in 4 weeks ? ?3. [redacted] weeks gestation of pregnancy ? ? ?Preterm labor symptoms and general obstetric precautions including but not limited to vaginal bleeding, contractions, leaking of fluid and fetal movement were reviewed in detail with the patient. ?Please refer to After Visit Summary for other counseling recommendations.  ? ?Return in about 4 weeks (around 08/27/2021). ? ?No future appointments. ? ? ?Sharen Counter, CNM  ?

## 2021-08-02 LAB — AFP, SERUM, OPEN SPINA BIFIDA
AFP MoM: 1.21
AFP Value: 60.1 ng/mL
Gest. Age on Collection Date: 17.6 weeks
Maternal Age At EDD: 21.2 yr
OSBR Risk 1 IN: 6301
Test Results:: NEGATIVE
Weight: 119 [lb_av]

## 2021-08-06 ENCOUNTER — Telehealth: Payer: Medicaid Other | Admitting: Physician Assistant

## 2021-08-06 ENCOUNTER — Telehealth: Payer: Self-pay | Admitting: *Deleted

## 2021-08-06 DIAGNOSIS — G43809 Other migraine, not intractable, without status migrainosus: Secondary | ICD-10-CM

## 2021-08-06 NOTE — Telephone Encounter (Signed)
TC from patient regarding migraine headache. Sought care via virtual visit but provider would not RX due to pregnancy. Patient planning to take tylenol. Advised combining with a drink with caffeine. Advised to seek care in MAU if headache does not resolve, gets worse, or for emergent concerns. Message sent to Blanch Media, CNM. ?

## 2021-08-06 NOTE — Progress Notes (Signed)
?Virtual Visit Consent  ? ?Molly Rose, you are scheduled for a virtual visit with a [facility] provider today.   ?  ?Just as with appointments in the office, your consent must be obtained to participate.  Your consent will be active for this visit and any virtual visit you may have with one of our providers in the next 365 days.   ?  ?If you have a MyChart account, a copy of this consent can be sent to you electronically.  All virtual visits are billed to your insurance company just like a traditional visit in the office.   ? ?As this is a virtual visit, video technology does not allow for your provider to perform a traditional examination.  This may limit your provider's ability to fully assess your condition.  If your provider identifies any concerns that need to be evaluated in person or the need to arrange testing (such as labs, EKG, etc.), we will make arrangements to do so.   ?  ?Although advances in technology are sophisticated, we cannot ensure that it will always work on either your end or our end.  If the connection with a video visit is poor, the visit may have to be switched to a telephone visit.  With either a video or telephone visit, we are not always able to ensure that we have a secure connection.    ? ?I need to obtain your verbal consent now.   Are you willing to proceed with your visit today?  ?  ?Molly Rose has provided verbal consent on 08/06/2021 for a virtual visit (video or telephone). ?  ?Molly Rio, PA-C  ? ?Date: 08/06/2021 4:09 PM ? ? ?Virtual Visit via Video Note  ? ?I, Molly Rose, connected with  Molly Rose  ([id number], May 08, 2001) on 08/06/21 at  4:00 PM EDT by a video-enabled telemedicine application and verified that I am speaking with the correct person using two identifiers. ? ?Location: ?Patient: Virtual Visit Location Patient: Home ?Provider: Virtual Visit Location Provider: Home Office ?  ?I discussed the limitations of evaluation and  management by telemedicine and the availability of in person appointments. The patient expressed understanding and agreed to proceed.   ? ?History of Present Illness: ?Molly Rose is a 21 y.o. who identifies as a female who was assigned female at birth, and is being seen today for flare of her migraine headaches. Notes this started last week. Has prior history of migraines but not in some time. Notes headache typically unilateral and associated with nausea, photophobia and occasional lightheadedness. Denies vision change or AMS. Notes BP have been good at checks with OB. Is taking Tylenol without any improvement. Would like an Rx medication for her migraines.  ? ? ?HPI: HPI  ?Problems:  ?Patient Active Problem List  ? Diagnosis Date Noted  ? UTI in pregnancy, second trimester 07/25/2021  ? 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 Supervision of normal first pregnancy, antepartum 06/22/2021  ? Migraine without aura and without status migrainosus, not intractable 09/18/2015  ? Complicated migraine 56/38/7564  ? Episodic tension-type headache 09/18/2015  ?  ?Allergies:  ?Allergies  ?Allergen Reactions  ? Rocephin [Ceftriaxone] Anaphylaxis  ?  See MAU note from 07/25/2021. Hives on face, anaphylaxis reaction with drop in O2 saturation.   ? Other   ?  Seasonal Allergies  ? ?Medications:  ?Current Outpatient Medications:  ?  Blood Pressure Monitoring (BLOOD PRESSURE KIT) DEVI,  1 kit by Does not apply route as needed., Disp: 1 each, Rfl: 0 ?  dicyclomine (BENTYL) 20 MG tablet, Take 1 tablet (20 mg total) by mouth 2 (two) times daily for 7 days., Disp: 14 tablet, Rfl: 0 ?  docusate sodium (COLACE) 100 MG capsule, Take 1 capsule (100 mg total) by mouth 2 (two) times daily as needed. (Patient not taking: Reported on 07/30/2021), Disp: 30 capsule, Rfl: 0 ?  EPINEPHrine 0.3 mg/0.3 mL IJ SOAJ injection, Inject 0.3 mg into the muscle as needed for anaphylaxis., Disp: 1 each, Rfl: 1 ?  Misc. Devices (GOJJI WEIGHT SCALE)  MISC, 1 Device by Does not apply route as needed., Disp: 1 each, Rfl: 0 ?  nitrofurantoin, macrocrystal-monohydrate, (MACROBID) 100 MG capsule, Take 1 capsule (100 mg total) by mouth at bedtime., Disp: 30 capsule, Rfl: 5 ?  Prenatal Vit-Fe Fumarate-FA (PRENATAL VITAMIN) 27-0.8 MG TABS, Take 1 tablet by mouth daily., Disp: 30 tablet, Rfl: 12 ? ?Observations/Objective: ?Patient is well-developed, well-nourished in no acute distress.  ?Resting comfortably at home.  ?Head is normocephalic, atraumatic.  ?No labored breathing. ?Speech is clear and coherent with logical content.  ?Patient is alert and oriented at baseline.  ? ?Assessment and Plan: ?1. Other migraine without status migrainosus, not intractable ? ?Discussed need to contact OB for Rx medications for migraines during pregnancy as safe options are controlled medications which cannot be given via video visit. She also needs assessment of BP to make sure this is not a trigger as we would not want to go undiagnosed and her develop more significant issues. ? ?No charge for video visit.  ? ?Follow Up Instructions: ?I discussed the assessment and treatment plan with the patient. The patient was provided an opportunity to ask questions and all were answered. The patient agreed with the plan and demonstrated an understanding of the instructions.  A copy of instructions were sent to the patient via MyChart unless otherwise noted below.  ? ?The patient was advised to call back or seek an in-person evaluation if the symptoms worsen or if the condition fails to improve as anticipated. ? ?Time:  ?I spent 15 minutes with the patient via telehealth technology discussing the above problems/concerns.   ? ?Molly Rio, PA-C ?

## 2021-08-06 NOTE — Patient Instructions (Signed)
?  [name], thank you for joining [name], PA-C for today's virtual visit.  While this provider is not your primary care provider (PCP), if your PCP is located in our provider database this encounter information will be shared with them immediately following your visit. ? ?Consent: ?(Patient) Molly Rose provided verbal consent for this virtual visit at the beginning of the encounter. ? ?Current Medications: ? ?Current Outpatient Medications:  ?  Blood Pressure Monitoring (BLOOD PRESSURE KIT) DEVI, 1 kit by Does not apply route as needed., Disp: 1 each, Rfl: 0 ?  dicyclomine (BENTYL) 20 MG tablet, Take 1 tablet (20 mg total) by mouth 2 (two) times daily for 7 days., Disp: 14 tablet, Rfl: 0 ?  docusate sodium (COLACE) 100 MG capsule, Take 1 capsule (100 mg total) by mouth 2 (two) times daily as needed. (Patient not taking: Reported on 07/30/2021), Disp: 30 capsule, Rfl: 0 ?  EPINEPHrine 0.3 mg/0.3 mL IJ SOAJ injection, Inject 0.3 mg into the muscle as needed for anaphylaxis., Disp: 1 each, Rfl: 1 ?  Misc. Devices (GOJJI WEIGHT SCALE) MISC, 1 Device by Does not apply route as needed., Disp: 1 each, Rfl: 0 ?  nitrofurantoin, macrocrystal-monohydrate, (MACROBID) 100 MG capsule, Take 1 capsule (100 mg total) by mouth at bedtime., Disp: 30 capsule, Rfl: 5 ?  Prenatal Vit-Fe Fumarate-FA (PRENATAL VITAMIN) 27-0.8 MG TABS, Take 1 tablet by mouth daily., Disp: 30 tablet, Rfl: 12  ? ?Medications ordered in this encounter:  ?No orders of the defined types were placed in this encounter. ?  ? ?*If you need refills on other medications prior to your next appointment, please contact your pharmacy* ? ?Follow-Up: ?Call back or seek an in-person evaluation if the symptoms worsen or if the condition fails to improve as anticipated. ? ?Other Instructions ?Please contact your OB for evaluation and further treatment giving your pregnancy status.  ? ? ?If you have been instructed to have an in-person evaluation  today at a local Urgent Care facility, please use the link below. It will take you to a list of all of our available [facility], including address, phone number and hours of operation. Please do not delay care.  ?[facility] ? ?If you or a family member do not have a primary care provider, use the link below to schedule a visit and establish care. When you choose a [facility] primary care physician or advanced practice provider, you gain a long-term partner in health. ?Find a Primary Care Provider ? ?Learn more about [facility]'s in-office and virtual care options: ?[facility]  ?

## 2021-08-07 ENCOUNTER — Inpatient Hospital Stay (HOSPITAL_COMMUNITY)
Admission: AD | Admit: 2021-08-07 | Discharge: 2021-08-07 | Disposition: A | Discharge status: Home / Self Care | Payer: Medicaid Other | Attending: Obstetrics and Gynecology | Admitting: Obstetrics and Gynecology

## 2021-08-07 ENCOUNTER — Other Ambulatory Visit: Payer: Self-pay

## 2021-08-07 ENCOUNTER — Encounter (HOSPITAL_COMMUNITY): Payer: Self-pay | Admitting: Obstetrics and Gynecology

## 2021-08-07 DIAGNOSIS — O99352 Diseases of the nervous system complicating pregnancy, second trimester: Secondary | ICD-10-CM | POA: Insufficient documentation

## 2021-08-07 DIAGNOSIS — Z3A18 18 weeks gestation of pregnancy: Secondary | ICD-10-CM | POA: Diagnosis not present

## 2021-08-07 DIAGNOSIS — G43009 Migraine without aura, not intractable, without status migrainosus: Secondary | ICD-10-CM | POA: Diagnosis not present

## 2021-08-07 HISTORY — DX: Migraine, unspecified, not intractable, without status migrainosus: G43.909

## 2021-08-07 LAB — URINALYSIS, ROUTINE W REFLEX MICROSCOPIC
Bilirubin Urine: NEGATIVE
Glucose, UA: NEGATIVE mg/dL
Hgb urine dipstick: NEGATIVE
Ketones, ur: NEGATIVE mg/dL
Leukocytes,Ua: NEGATIVE
Nitrite: NEGATIVE
Protein, ur: NEGATIVE mg/dL
Specific Gravity, Urine: 1.018 (ref 1.005–1.030)
pH: 7 (ref 5.0–8.0)

## 2021-08-07 MED ORDER — METOCLOPRAMIDE HCL 10 MG PO TABS: 10.0000 mg | ORAL_TABLET | Freq: Three times a day (TID) | ORAL | 1 refills | Status: DC | PRN

## 2021-08-07 MED ORDER — CYCLOBENZAPRINE HCL 10 MG PO TABS: 10.0000 mg | ORAL_TABLET | Freq: Three times a day (TID) | ORAL | 1 refills | Status: DC | PRN

## 2021-08-07 NOTE — MAU Note (Addendum)
I have a history of migraines before pregnancy. They cause me to have dizziness besides the pain. Tylenol helps the dizziness but my head still pounds despite taking medication. I told them yesterday at my virtual appt.but no new meds were prescribed.  I want to be sure everything is ok with the pregnancy since I have been here several times already. Denies VB. I do not have a headache now but my last headache was 1400 today. Resting seems to be the only thing that makes it go away ?

## 2021-08-07 NOTE — Progress Notes (Signed)
Written and verbal d/c instructions given and understanding voiced. 

## 2021-08-07 NOTE — Discharge Instructions (Signed)
For prevention of migraines in pregnancy: ?-Magnesium, 400mg  by mouth, once daily ?-Vitamin B2, 400mg  by mouth, once daily ? ?For treatment of migraines in pregnancy: ?-take medication at the first sign of the pain of a headache, or the first sign of your aura ?-start with 1000mg  Tylenol (do not exceed 4000mg  of Tylenol in 24hrs), with or without Reglan 10mg  ?-if no relief after 1-2hours, can take Flexeril 10mg  ? ? ? ?Safe Medications in Pregnancy  ? ?Acne: ?Benzoyl Peroxide ?Salicylic Acid ? ?Backache/Headache: ?Tylenol: 2 regular strength every 4 hours OR ?             2 Extra strength every 6 hours ? ?Colds/Coughs/Allergies: ?Benadryl (alcohol free) 25 mg every 6 hours as needed ?Breath right strips ?Claritin ?Cepacol throat lozenges ?Chloraseptic throat spray ?Cold-Eeze- up to three times per day ?Cough drops, alcohol free ?Flonase (by prescription only) ?Guaifenesin ?Mucinex ?Robitussin DM (plain only, alcohol free) ?Saline nasal spray/drops ?Sudafed (pseudoephedrine) & Actifed ** use only after [redacted] weeks gestation and if you do not have high blood pressure ?Tylenol ?Vicks Vaporub ?Zinc lozenges ?Zyrtec  ? ?Constipation: ?Colace ?Ducolax suppositories ?Fleet enema ?Glycerin suppositories ?Metamucil ?Milk of magnesia ?Miralax ?Senokot ?Smooth move tea ? ?Diarrhea: ?Kaopectate ?Imodium A-D ? ?*NO pepto Bismol ? ?Hemorrhoids: ?Anusol ?Anusol HC ?Preparation H ?Tucks ? ?Indigestion: ?Tums ?Maalox ?Mylanta ?Zantac  ?Pepcid ? ?Insomnia: ?Benadryl (alcohol free) 25mg  every 6 hours as needed ?Tylenol PM ?Unisom, no Gelcaps ? ?Leg Cramps: ?Tums ?MagGel ? ?Nausea/Vomiting:  ?Bonine ?Dramamine ?Emetrol ?Ginger extract ?Sea bands ?Meclizine  ?Nausea medication to take during pregnancy:  ?Unisom (doxylamine succinate 25 mg tablets) Take one tablet daily at bedtime. If symptoms are not adequately controlled, the dose can be increased to a maximum recommended dose of two tablets daily (1/2 tablet in the morning, 1/2 tablet  mid-afternoon and one at bedtime). ?Vitamin B6 100mg  tablets. Take one tablet twice a day (up to 200 mg per day). ? ?Skin Rashes: ?Aveeno products ?Benadryl cream or 25mg  every 6 hours as needed ?Calamine Lotion ?1% cortisone cream ? ?Yeast infection: ?Gyne-lotrimin 7 ?Monistat 7 ? ?Gum/tooth pain: ?Anbesol ? ?**If taking multiple medications, please check labels to avoid duplicating the same active ingredients ?**take medication as directed on the label ?** Do not exceed 4000 mg of tylenol in 24 hours ?**Do not take medications that contain aspirin or ibuprofen ? ? ? ? ?

## 2021-08-07 NOTE — MAU Provider Note (Signed)
?History  ?  ? ?[id number] ? ?Arrival date and time: 08/07/21 1850 ?  ? ?Chief Complaint  ?Patient presents with  ? Headache  ? ? ? ?HPI ?Rheagan J Daivd Rose is a 21 y.o. at 12w5dby LMP with PMHx notable for migraines, who presents for headache treatment. ?Patient reports increase in headaches since becoming pregnant. Tried to see her PCP for management of this but was instructed to follow up with her OB. States she was taking something for her headaches when she was younger but stopped in middle school. Currently reports daily headaches that normally start while at work & improve later in the afternoon. Pain improved with tylenol but still feels a dull pressure. Endorses photophobia with her headaches. Also has dizziness & nausea with her headache. Last episode was this afternoon. Currently does not have any symptoms.  ?Denies abdominal pain, vaginal bleeding, or LOF.   ? ?OB History   ? ? Gravida  ?1  ? Para  ?   ? Term  ?   ? Preterm  ?   ? AB  ?   ? Living  ?0  ?  ? ? SAB  ?   ? IAB  ?   ? Ectopic  ?   ? Multiple  ?   ? Live Births  ?   ?   ?  ?  ? ? ?Past Medical History:  ?Diagnosis Date  ? Migraine   ? ? ?Past Surgical History:  ?Procedure Laterality Date  ? WISDOM TOOTH EXTRACTION    ? ? ?Family History  ?Problem Relation Age of Onset  ? Healthy Mother   ? Healthy Father   ? ? ?Allergies  ?Allergen Reactions  ? Rocephin [Ceftriaxone] Anaphylaxis  ?  See MAU note from 07/25/2021. Hives on face, anaphylaxis reaction with drop in O2 saturation.   ? Other   ?  Seasonal Allergies  ? ? ?No current facility-administered medications on file prior to encounter.  ? ?Current Outpatient Medications on File Prior to Encounter  ?Medication Sig Dispense Refill  ? Blood Pressure Monitoring (BLOOD PRESSURE KIT) DEVI 1 kit by Does not apply route as needed. 1 each 0  ? dicyclomine (BENTYL) 20 MG tablet Take 1 tablet (20 mg total) by mouth 2 (two) times daily for 7 days. 14 tablet 0  ? docusate sodium (COLACE) 100 MG capsule Take  1 capsule (100 mg total) by mouth 2 (two) times daily as needed. (Patient not taking: Reported on 07/30/2021) 30 capsule 0  ? EPINEPHrine 0.3 mg/0.3 mL IJ SOAJ injection Inject 0.3 mg into the muscle as needed for anaphylaxis. 1 each 1  ? Misc. Devices (GOJJI WEIGHT SCALE) MISC 1 Device by Does not apply route as needed. 1 each 0  ? nitrofurantoin, macrocrystal-monohydrate, (MACROBID) 100 MG capsule Take 1 capsule (100 mg total) by mouth at bedtime. 30 capsule 5  ? Prenatal Vit-Fe Fumarate-FA (PRENATAL VITAMIN) 27-0.8 MG TABS Take 1 tablet by mouth daily. 30 tablet 12  ? ? ? ?ROS ?Pertinent positives and negative per HPI, all others reviewed and negative ? ?Physical Exam  ? ?BP (!) 106/52 (BP Location: Right Arm)   Pulse 67   Temp 98.3 ?F (36.8 ?C)   Resp 16   Ht 5' (1.524 m)   Wt 54.9 kg   LMP 03/29/2021   SpO2 100%   BMI 23.63 kg/m?  ? ?Patient Vitals for the past 24 hrs: ? BP Temp Pulse Resp SpO2 Height Weight  ?08/07/21 2158 (Marland Kitchen  106/52 -- 67 16 -- -- --  ?08/07/21 1931 (!) 108/55 -- -- -- -- -- --  ?08/07/21 1929 -- 98.3 ?F (36.8 ?C) 68 16 100 % 5' (1.524 m) 54.9 kg  ? ? ?Physical Exam ?Vitals and nursing note reviewed.  ?Constitutional:   ?   General: She is not in acute distress. ?   Appearance: She is well-developed.  ?HENT:  ?   Head: Normocephalic and atraumatic.  ?Eyes:  ?   General: No scleral icterus. ?   Pupils: Pupils are equal, round, and reactive to light.  ?Pulmonary:  ?   Effort: Pulmonary effort is normal. No respiratory distress.  ?Neurological:  ?   Mental Status: She is alert.  ?Psychiatric:     ?   Mood and Affect: Mood normal.     ?   Behavior: Behavior normal.  ?  ? ? ? ?Labs ?Results for orders placed or performed during the hospital encounter of 08/07/21 (from the past 24 hour(s))  ?Urinalysis, Routine w reflex microscopic Urine, Clean Catch     Status: Abnormal  ? Collection Time: 08/07/21  7:40 PM  ?Result Value Ref Range  ? Color, Urine YELLOW YELLOW  ? APPearance CLOUDY (A) CLEAR   ? Specific Gravity, Urine 1.018 1.005 - 1.030  ? pH 7.0 5.0 - 8.0  ? Glucose, UA NEGATIVE NEGATIVE mg/dL  ? Hgb urine dipstick NEGATIVE NEGATIVE  ? Bilirubin Urine NEGATIVE NEGATIVE  ? Ketones, ur NEGATIVE NEGATIVE mg/dL  ? Protein, ur NEGATIVE NEGATIVE mg/dL  ? Nitrite NEGATIVE NEGATIVE  ? Leukocytes,Ua NEGATIVE NEGATIVE  ? ? ?Imaging ?No results found. ? ?MAU Course  ?Procedures ?Lab Orders    ?     Urinalysis, Routine w reflex microscopic Urine, Clean Catch    ?Meds ordered this encounter  ?Medications  ? metoCLOPramide (REGLAN) 10 MG tablet  ?  Sig: Take 1 tablet (10 mg total) by mouth every 8 (eight) hours as needed for nausea (or headache).  ?  Dispense:  30 tablet  ?  Refill:  1  ?  Order Specific Question:   Supervising Provider  ?  Answer:   CONSTANT, PEGGY [[id number]]  ? cyclobenzaprine (FLEXERIL) 10 MG tablet  ?  Sig: Take 1 tablet (10 mg total) by mouth 3 (three) times daily as needed (headache).  ?  Dispense:  30 tablet  ?  Refill:  1  ?  Order Specific Question:   Supervising Provider  ?  Answer:   CONSTANT, PEGGY [[id number]]  ? ?Imaging Orders  ?No imaging studies ordered today  ? ? ?MDM ?FHT present via doppler ? ?Patient currently has no complaints; is here to have treatment plan for her recurrent headaches. Discussed daily magnesium for prevention. Also reviewed tylenol, reglan, & flexeril for treatment at home. Will send referral to [facility] for headache clinic.  ?Assessment and Plan  ? ?1. Migraine without aura and without status migrainosus, not intractable   ?2. [redacted] weeks gestation of pregnancy   ? ?-Rx flexeril & regaln to use with tylenol for headaches ?-Recommend daily OTC magnesiums ?-Message to [facility] for headache appointment with [facility] ? ?Jorje Guild, NP ?08/07/21 ?10:06 PM ? ? ?

## 2021-08-09 ENCOUNTER — Telehealth: Payer: Self-pay | Admitting: Physician Assistant

## 2021-08-09 ENCOUNTER — Inpatient Hospital Stay (HOSPITAL_COMMUNITY)
Admission: AD | Admit: 2021-08-09 | Discharge: 2021-08-09 | Disposition: A | Discharge status: Home / Self Care | Payer: Medicaid Other | Source: Ambulatory Visit | Attending: Obstetrics and Gynecology | Admitting: Obstetrics and Gynecology

## 2021-08-09 ENCOUNTER — Encounter (HOSPITAL_COMMUNITY): Payer: Self-pay | Admitting: Obstetrics and Gynecology

## 2021-08-09 ENCOUNTER — Other Ambulatory Visit: Payer: Self-pay

## 2021-08-09 DIAGNOSIS — R102 Pelvic and perineal pain: Secondary | ICD-10-CM | POA: Insufficient documentation

## 2021-08-09 DIAGNOSIS — N949 Unspecified condition associated with female genital organs and menstrual cycle: Secondary | ICD-10-CM | POA: Diagnosis not present

## 2021-08-09 DIAGNOSIS — N898 Other specified noninflammatory disorders of vagina: Secondary | ICD-10-CM | POA: Diagnosis not present

## 2021-08-09 DIAGNOSIS — O26892 Other specified pregnancy related conditions, second trimester: Secondary | ICD-10-CM | POA: Insufficient documentation

## 2021-08-09 DIAGNOSIS — O2342 Unspecified infection of urinary tract in pregnancy, second trimester: Secondary | ICD-10-CM | POA: Insufficient documentation

## 2021-08-09 DIAGNOSIS — R1032 Left lower quadrant pain: Secondary | ICD-10-CM | POA: Diagnosis not present

## 2021-08-09 DIAGNOSIS — Z87440 Personal history of urinary (tract) infections: Secondary | ICD-10-CM | POA: Diagnosis not present

## 2021-08-09 DIAGNOSIS — N390 Urinary tract infection, site not specified: Secondary | ICD-10-CM | POA: Insufficient documentation

## 2021-08-09 DIAGNOSIS — Z3A19 19 weeks gestation of pregnancy: Secondary | ICD-10-CM | POA: Insufficient documentation

## 2021-08-09 HISTORY — DX: Urinary tract infection, site not specified: N39.0

## 2021-08-09 LAB — WET PREP, GENITAL
Clue Cells Wet Prep HPF POC: NONE SEEN
Sperm: NONE SEEN
Trich, Wet Prep: NONE SEEN
WBC, Wet Prep HPF POC: <10 (ref <10)
Yeast Wet Prep HPF POC: NONE SEEN

## 2021-08-09 LAB — URINALYSIS, ROUTINE W REFLEX MICROSCOPIC
Bilirubin Urine: NEGATIVE
Glucose, UA: NEGATIVE mg/dL
Hgb urine dipstick: NEGATIVE
Ketones, ur: NEGATIVE mg/dL
Nitrite: NEGATIVE
Protein, ur: 30 mg/dL — AB
Specific Gravity, Urine: 1.029 (ref 1.005–1.030)
pH: 6 (ref 5.0–8.0)

## 2021-08-09 NOTE — MAU Provider Note (Signed)
?History  ?  ? ?CSN: [id number] ? ?Arrival date and time: 08/09/21 0844 ? ? Event Date/Time  ? First Provider Initiated Contact with Patient 08/09/21 0945   ?  ? ?Chief Complaint  ?Patient presents with  ? Abdominal Pain  ? ?21 y.o. G1 @19 .0 wks presenting with LLQ pain. Pain started last night after IC. Pain is intermittent and cramping. Rates pain 5/10. She took Tylenol last night and again this am which helped. Denies VB or spotting. Reports increased vaginal discharge today but denies itching or malodor. Reports hx of constipation but had soft BM yesterday. No GI sx. Reports +FM.  ? ?OB History   ? ? Gravida  ?1  ? Para  ?   ? Term  ?   ? Preterm  ?   ? AB  ?   ? Living  ?0  ?  ? ? SAB  ?   ? IAB  ?   ? Ectopic  ?   ? Multiple  ?   ? Live Births  ?   ?   ?  ?  ? ? ?Past Medical History:  ?Diagnosis Date  ? Migraine   ? UTI (urinary tract infection)   ? ? ?Past Surgical History:  ?Procedure Laterality Date  ? NO PAST SURGERIES    ? WISDOM TOOTH EXTRACTION    ? ? ?Family History  ?Problem Relation Age of Onset  ? Healthy Mother   ? Healthy Father   ? ? ?Social History  ? ?Tobacco Use  ? Smoking status: Never  ? Smokeless tobacco: Never  ?Vaping Use  ? Vaping Use: Former  ?Substance Use Topics  ? Alcohol use: No  ?  Alcohol/week: 0.0 standard drinks  ? Drug use: No  ? ? ?Allergies:  ?Allergies  ?Allergen Reactions  ? Rocephin [Ceftriaxone] Anaphylaxis  ?  See MAU note from 07/25/2021. Hives on face, anaphylaxis reaction with drop in O2 saturation.   ? Other   ?  Seasonal Allergies  ? ? ?Medications Prior to Admission  ?Medication Sig Dispense Refill Last Dose  ? nitrofurantoin, macrocrystal-monohydrate, (MACROBID) 100 MG capsule Take 1 capsule (100 mg total) by mouth at bedtime. 30 capsule 5 08/08/2021  ? Prenatal Vit-Fe Fumarate-FA (PRENATAL VITAMIN) 27-0.8 MG TABS Take 1 tablet by mouth daily. 30 tablet 12 08/08/2021  ? Blood Pressure Monitoring (BLOOD PRESSURE KIT) DEVI 1 kit by Does not apply route as needed. 1 each  0   ? cyclobenzaprine (FLEXERIL) 10 MG tablet Take 1 tablet (10 mg total) by mouth 3 (three) times daily as needed (headache). 30 tablet 1   ? dicyclomine (BENTYL) 20 MG tablet Take 1 tablet (20 mg total) by mouth 2 (two) times daily for 7 days. 14 tablet 0   ? docusate sodium (COLACE) 100 MG capsule Take 1 capsule (100 mg total) by mouth 2 (two) times daily as needed. (Patient not taking: Reported on 07/30/2021) 30 capsule 0   ? EPINEPHrine 0.3 mg/0.3 mL IJ SOAJ injection Inject 0.3 mg into the muscle as needed for anaphylaxis. 1 each 1   ? metoCLOPramide (REGLAN) 10 MG tablet Take 1 tablet (10 mg total) by mouth every 8 (eight) hours as needed for nausea (or headache). 30 tablet 1   ? Misc. Devices (GOJJI WEIGHT SCALE) MISC 1 Device by Does not apply route as needed. 1 each 0   ? ? ?Review of Systems  ?Gastrointestinal:  Positive for abdominal pain. Negative for constipation, diarrhea, nausea and vomiting.  ?  Genitourinary:  Positive for vaginal discharge. Negative for dysuria, hematuria, urgency and vaginal bleeding.  ?Physical Exam  ? ?Blood pressure (!) 104/57, pulse 91, temperature 98.1 ?F (36.7 ?C), temperature source Oral, resp. rate 17, last menstrual period 03/29/2021, SpO2 100 %. ? ?Physical Exam ?Vitals and nursing note reviewed. Exam conducted with a chaperone present.  ?Constitutional:   ?   Appearance: Normal appearance.  ?HENT:  ?   Head: Normocephalic and atraumatic.  ?Cardiovascular:  ?   Rate and Rhythm: Normal rate.  ?Pulmonary:  ?   Effort: Pulmonary effort is normal. No respiratory distress.  ?Abdominal:  ?   General: There is no distension.  ?   Palpations: Abdomen is soft. There is no mass.  ?   Tenderness: There is no abdominal tenderness. There is no guarding or rebound.  ?   Hernia: No hernia is present.  ?   Comments: Fundus @U   ?Musculoskeletal:     ?   General: Normal range of motion.  ?   Cervical back: Normal range of motion.  ?Skin: ?   General: Skin is warm and dry.  ?Neurological:  ?    General: No focal deficit present.  ?   Mental Status: She is alert and oriented to person, place, and time.  ?Psychiatric:     ?   Mood and Affect: Mood normal.     ?   Behavior: Behavior normal.  ?FHT 146 ? ?Results for orders placed or performed during the hospital encounter of 08/09/21 (from the past 24 hour(s))  ?Urinalysis, Routine w reflex microscopic Urine, Clean Catch     Status: Abnormal  ? Collection Time: 08/09/21  9:16 AM  ?Result Value Ref Range  ? Color, Urine AMBER (A) YELLOW  ? APPearance CLOUDY (A) CLEAR  ? Specific Gravity, Urine 1.029 1.005 - 1.030  ? pH 6.0 5.0 - 8.0  ? Glucose, UA NEGATIVE NEGATIVE mg/dL  ? Hgb urine dipstick NEGATIVE NEGATIVE  ? Bilirubin Urine NEGATIVE NEGATIVE  ? Ketones, ur NEGATIVE NEGATIVE mg/dL  ? Protein, ur 30 (A) NEGATIVE mg/dL  ? Nitrite NEGATIVE NEGATIVE  ? Leukocytes,Ua MODERATE (A) NEGATIVE  ? RBC / HPF 0-5 0 - 5 RBC/hpf  ? WBC, UA 21-50 0 - 5 WBC/hpf  ? Bacteria, UA MANY (A) NONE SEEN  ? Squamous Epithelial / LPF 21-50 0 - 5  ? Mucus PRESENT   ? Non Squamous Epithelial 0-5 (A) NONE SEEN  ?Wet prep, genital     Status: None  ? Collection Time: 08/09/21 10:50 AM  ? Specimen: Vaginal  ?Result Value Ref Range  ? Yeast Wet Prep HPF POC NONE SEEN NONE SEEN  ? Trich, Wet Prep NONE SEEN NONE SEEN  ? Clue Cells Wet Prep HPF POC NONE SEEN NONE SEEN  ? WBC, Wet Prep HPF POC <10 <10  ? Sperm NONE SEEN   ? ?MAU Course  ?Procedures ? ?MDM ?Labs ordered and reviewed.  No evidence of imminent SAB or UTI however patient is on suppression.  GC pending.  Pain likely MSK and/or RL.  Discussed comfort measures.  Stable for discharge home. ? ?Assessment and Plan  ? ?1. UTI in pregnancy, second trimester   ?2. [redacted] weeks gestation of pregnancy   ?3. Round ligament pain   ? ?Discharge home ?Follow-up at [facility] as scheduled ?SAB precautions ?Tylenol and/or heat as needed ? ?Allergies as of 08/09/2021   ? ?   Reactions  ? Rocephin [ceftriaxone] Anaphylaxis  ? See MAU note  from 07/25/2021.  Hives on face, anaphylaxis reaction with drop in O2 saturation.   ? Other   ? Seasonal Allergies  ? ?  ? ?  ?Medication List  ?  ? ?TAKE these medications   ? ?Blood Pressure Kit Devi ?1 kit by Does not apply route as needed. ?  ?cyclobenzaprine 10 MG tablet ?Commonly known as: FLEXERIL ?Take 1 tablet (10 mg total) by mouth 3 (three) times daily as needed (headache). ?  ?dicyclomine 20 MG tablet ?Commonly known as: BENTYL ?Take 1 tablet (20 mg total) by mouth 2 (two) times daily for 7 days. ?  ?docusate sodium 100 MG capsule ?Commonly known as: COLACE ?Take 1 capsule (100 mg total) by mouth 2 (two) times daily as needed. ?  ?EPINEPHrine 0.3 mg/0.3 mL Soaj injection ?Commonly known as: EPI-PEN ?Inject 0.3 mg into the muscle as needed for anaphylaxis. ?  ?Gojji Weight Scale Misc ?1 Device by Does not apply route as needed. ?  ?metoCLOPramide 10 MG tablet ?Commonly known as: REGLAN ?Take 1 tablet (10 mg total) by mouth every 8 (eight) hours as needed for nausea (or headache). ?  ?nitrofurantoin (macrocrystal-monohydrate) 100 MG capsule ?Commonly known as: Macrobid ?Take 1 capsule (100 mg total) by mouth at bedtime. ?  ?Prenatal Vitamin 27-0.8 MG Tabs ?Take 1 tablet by mouth daily. ?  ? ?  ? ?Julianne Handler, CNM ?08/09/2021, 11:10 AM  ?

## 2021-08-09 NOTE — MAU Note (Signed)
Vag swabs recollected. Lab called, stated sticker had MRN only, did not have pt name. Had not received call from  lab.  Provider notified. Cultures resent. ?

## 2021-08-09 NOTE — MAU Note (Signed)
Molly Rose is a 21 y.o. at [redacted]w[redacted]d here in MAU reporting: stabbing pain in LLQ started last night after intercourse.  Took some Tylenol, seemed to help some.  When she got up this morning, she turned a certain way and it started again, took  some Tylenol, some relief. No bleeding.  ? ?Onset of complaint: last night ?Pain score: 5/10 ?Vitals:  ? 08/09/21 0905  ?BP: (!) 104/57  ?Pulse: 91  ?Resp: 17  ?Temp: 98.1 ?F (36.7 ?C)  ?SpO2: 100%  ?   ?FHT:146 ?Lab orders placed from triage:  urine ?

## 2021-08-09 NOTE — Telephone Encounter (Signed)
Called to notify patient of her appointment with the headache specialist on 10/12/21 at 11:30am.  ?

## 2021-08-10 LAB — GC/CHLAMYDIA PROBE AMP (~~LOC~~) NOT AT ARMC
Chlamydia: NEGATIVE
Comment: NEGATIVE
Comment: NORMAL
Neisseria Gonorrhea: NEGATIVE

## 2021-08-13 ENCOUNTER — Telehealth: Payer: Self-pay | Admitting: Physician Assistant

## 2021-08-13 NOTE — Telephone Encounter (Signed)
Called patient to notify her of her appointment change from 11:30 on 10/12/21 to 11:00.  ?

## 2021-08-16 ENCOUNTER — Encounter: Payer: Self-pay | Admitting: Advanced Practice Midwife

## 2021-08-16 ENCOUNTER — Telehealth: Payer: Self-pay

## 2021-08-16 NOTE — Telephone Encounter (Signed)
S/w pt and she stated that she has been having low back pain that started this week. Denies cramping, vaginal bleeding, or discomfort with urination. 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 Tylenol has helped decrease pain some but not completely relieved. Advised pt that she does have a prescription for flexeril, if pain does not get better follow up. ?

## 2021-08-17 ENCOUNTER — Ambulatory Visit: Payer: Medicaid Other

## 2021-08-23 ENCOUNTER — Ambulatory Visit: Payer: Medicaid Other | Attending: Advanced Practice Midwife

## 2021-08-23 ENCOUNTER — Encounter: Payer: Self-pay | Admitting: Physician Assistant

## 2021-08-23 ENCOUNTER — Telehealth: Payer: Medicaid Other | Admitting: Physician Assistant

## 2021-08-23 ENCOUNTER — Ambulatory Visit: Payer: Medicaid Other | Attending: Obstetrics and Gynecology

## 2021-08-23 ENCOUNTER — Other Ambulatory Visit: Payer: Self-pay | Admitting: Advanced Practice Midwife

## 2021-08-23 DIAGNOSIS — Z3400 Encounter for supervision of normal first pregnancy, unspecified trimester: Secondary | ICD-10-CM | POA: Diagnosis not present

## 2021-08-23 DIAGNOSIS — Z148 Genetic carrier of other disease: Secondary | ICD-10-CM | POA: Diagnosis not present

## 2021-08-23 DIAGNOSIS — Z363 Encounter for antenatal screening for malformations: Secondary | ICD-10-CM | POA: Diagnosis present

## 2021-08-23 DIAGNOSIS — Z3A21 21 weeks gestation of pregnancy: Secondary | ICD-10-CM | POA: Diagnosis not present

## 2021-08-23 DIAGNOSIS — Z315 Encounter for genetic counseling: Secondary | ICD-10-CM | POA: Diagnosis not present

## 2021-08-23 DIAGNOSIS — Z3A19 19 weeks gestation of pregnancy: Secondary | ICD-10-CM | POA: Diagnosis not present

## 2021-08-23 DIAGNOSIS — J069 Acute upper respiratory infection, unspecified: Secondary | ICD-10-CM | POA: Diagnosis not present

## 2021-08-23 MED ORDER — COVID-19 AT-HOME TEST VI KIT: PACK | 0 refills | Status: DC

## 2021-08-23 NOTE — Patient Instructions (Signed)
?[name], thank you for joining [name], PA-C for today's virtual visit.  While this provider is not your primary care provider (PCP), if your PCP is located in our provider database this encounter information will be shared with them immediately following your visit. ? ?Consent: ?(Patient) Molly Rose provided verbal consent for this virtual visit at the beginning of the encounter. ? ?Current Medications: ? ?Current Outpatient Medications:  ?  COVID-19 At-Home Test KIT, Use as directed by box to test for COVID., Disp: 1 kit, Rfl: 0 ?  Blood Pressure Monitoring (BLOOD PRESSURE KIT) DEVI, 1 kit by Does not apply route as needed., Disp: 1 each, Rfl: 0 ?  cyclobenzaprine (FLEXERIL) 10 MG tablet, Take 1 tablet (10 mg total) by mouth 3 (three) times daily as needed (headache)., Disp: 30 tablet, Rfl: 1 ?  dicyclomine (BENTYL) 20 MG tablet, Take 1 tablet (20 mg total) by mouth 2 (two) times daily for 7 days., Disp: 14 tablet, Rfl: 0 ?  docusate sodium (COLACE) 100 MG capsule, Take 1 capsule (100 mg total) by mouth 2 (two) times daily as needed. (Patient not taking: Reported on 07/30/2021), Disp: 30 capsule, Rfl: 0 ?  EPINEPHrine 0.3 mg/0.3 mL IJ SOAJ injection, Inject 0.3 mg into the muscle as needed for anaphylaxis., Disp: 1 each, Rfl: 1 ?  metoCLOPramide (REGLAN) 10 MG tablet, Take 1 tablet (10 mg total) by mouth every 8 (eight) hours as needed for nausea (or headache)., Disp: 30 tablet, Rfl: 1 ?  Misc. Devices (GOJJI WEIGHT SCALE) MISC, 1 Device by Does not apply route as needed., Disp: 1 each, Rfl: 0 ?  Prenatal Vit-Fe Fumarate-FA (PRENATAL VITAMIN) 27-0.8 MG TABS, Take 1 tablet by mouth daily., Disp: 30 tablet, Rfl: 12  ? ?Medications ordered in this encounter:  ?Meds ordered this encounter  ?Medications  ? COVID-19 At-Home Test KIT  ?  Sig: Use as directed by box to test for COVID.  ?  Dispense:  1 kit  ?  Refill:  0  ?  Order Specific Question:   Supervising Provider  ?  Answer:    Noemi Chapel [[id number]]  ?  ? ?*If you need refills on other medications prior to your next appointment, please contact your pharmacy* ? ?Follow-Up: ?Call back or seek an in-person evaluation if the symptoms worsen or if the condition fails to improve as anticipated. ? ?Other Instructions ?Please take the home COVID test and message me with your results. ? ?Please keep hydrated and get plenty of rest. ?Start a saline nasal rinse. ?If you have a humidifier, run it in the bedroom at night.  ?Can use tylenol for sore throat, headache. ?You can use decongestant like Sudafed OTC if needed, as well as plain Mucinex. ?Continue your prenatal vitamins. ?I do recommend additional OTC Vitamin D3 and Vitamin C daily -- you can verify doses with pharmacist.  ? ? ?If you have been instructed to have an in-person evaluation today at a local Urgent Care facility, please use the link below. It will take you to a list of all of our available [facility], including address, phone number and hours of operation. Please do not delay care.  ?[facility] ? ?If you or a family member do not have a primary care provider, use the link below to schedule a visit and establish care. When you choose a [facility] primary care physician or advanced practice provider, you gain a long-term partner in health. ?  Find a Primary Care Provider ? ?Learn more about [facility]'s in-office and virtual care options: ?[facility]  ?

## 2021-08-23 NOTE — Progress Notes (Signed)
?Virtual Visit Consent  ? ?Molly Rose, you are scheduled for a virtual visit with a [facility] provider today.   ?  ?Just as with appointments in the office, your consent must be obtained to participate.  Your consent will be active for this visit and any virtual visit you may have with one of our providers in the next 365 days.   ?  ?If you have a MyChart account, a copy of this consent can be sent to you electronically.  All virtual visits are billed to your insurance company just like a traditional visit in the office.   ? ?As this is a virtual visit, video technology does not allow for your provider to perform a traditional examination.  This may limit your provider's ability to fully assess your condition.  If your provider identifies any concerns that need to be evaluated in person or the need to arrange testing (such as labs, EKG, etc.), we will make arrangements to do so.   ?  ?Although advances in technology are sophisticated, we cannot ensure that it will always work on either your end or our end.  If the connection with a video visit is poor, the visit may have to be switched to a telephone visit.  With either a video or telephone visit, we are not always able to ensure that we have a secure connection.    ? ?I need to obtain your verbal consent now.   Are you willing to proceed with your visit today?  ?  ?Molly Rose has provided verbal consent on 08/23/2021 for a virtual visit (video or telephone). ?  ?Molly Rio, PA-C  ? ?Date: 08/23/2021 11:12 AM ? ? ?Virtual Visit via Video Note  ? ?I, Molly Rose, connected with  Molly Rose  ([id number], Jan 09, 2001) on 08/23/21 at 11:00 AM EDT by a video-enabled telemedicine application and verified that I am speaking with the correct person using two identifiers. ? ?Location: ?Patient: Virtual Visit Location Patient: Home ?Provider: Virtual Visit Location Provider: Home Office ?  ?I discussed the limitations of evaluation and  management by telemedicine and the availability of in person appointments. The patient expressed understanding and agreed to proceed.   ? ?History of Present Illness: ?Molly Rose is a 21 y.o. who identifies as a female who was assigned female at birth, and is being seen today for URI symptoms. ? ?Endorses symptoms starting Monday into Tuesday with sore throat, with chest and head congestion and cough. Notes headache. Denies fever, chills or aches. Notes some facial pain with sneezing and coughing. Denies GI symptoms. Denies recent travel. Notes their significant other had similar symptoms but now feeling better. Has not tested for COVID. 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 Is wondering if that is what she has again and should she be started on antiviral. ? ?Is currently [redacted] weeks pregnant.  ? ?HPI: HPI  ?Problems:  ?Patient Active Problem List  ? Diagnosis Date Noted  ? UTI in pregnancy, second trimester 07/25/2021  ? 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 Supervision of normal first pregnancy, antepartum 06/22/2021  ? Migraine without aura and without status migrainosus, not intractable 09/18/2015  ? Complicated migraine 16/05/930  ? Episodic tension-type headache 09/18/2015  ?  ?Allergies:  ?Allergies  ?Allergen Reactions  ? Rocephin [Ceftriaxone] Anaphylaxis  ?  See MAU note from 07/25/2021. Hives on face, anaphylaxis reaction with drop in O2 saturation.   ?  Other   ?  Seasonal Allergies  ? ?Medications:  ?Current Outpatient Medications:  ?  Blood Pressure Monitoring (BLOOD PRESSURE KIT) DEVI, 1 kit by Does not apply route as needed., Disp: 1 each, Rfl: 0 ?  cyclobenzaprine (FLEXERIL) 10 MG tablet, Take 1 tablet (10 mg total) by mouth 3 (three) times daily as needed (headache)., Disp: 30 tablet, Rfl: 1 ?  dicyclomine (BENTYL) 20 MG tablet, Take 1 tablet (20 mg total) by mouth 2 (two) times daily for 7 days., Disp: 14 tablet, Rfl: 0 ?  docusate sodium (COLACE) 100 MG  capsule, Take 1 capsule (100 mg total) by mouth 2 (two) times daily as needed. (Patient not taking: Reported on 07/30/2021), Disp: 30 capsule, Rfl: 0 ?  EPINEPHrine 0.3 mg/0.3 mL IJ SOAJ injection, Inject 0.3 mg into the muscle as needed for anaphylaxis., Disp: 1 each, Rfl: 1 ?  metoCLOPramide (REGLAN) 10 MG tablet, Take 1 tablet (10 mg total) by mouth every 8 (eight) hours as needed for nausea (or headache)., Disp: 30 tablet, Rfl: 1 ?  Misc. Devices (GOJJI WEIGHT SCALE) MISC, 1 Device by Does not apply route as needed., Disp: 1 each, Rfl: 0 ?  Prenatal Vit-Fe Fumarate-FA (PRENATAL VITAMIN) 27-0.8 MG TABS, Take 1 tablet by mouth daily., Disp: 30 tablet, Rfl: 12 ? ?Observations/Objective: ?Patient is well-developed, well-nourished in no acute distress.  ?Resting comfortably at home.  ?Head is normocephalic, atraumatic.  ?No labored breathing. ?Speech is clear and coherent with logical content.  ?Patient is alert and oriented at baseline.  ? ?Assessment and Plan: ?1. Viral URI ? ?Recommend home COVID test due to risk factors in pregnant patient. Discussed highly unlikely to be the flu giving absence of fever, aches and recent Influenza A. More likely a run-of-the mill virus since significant other with similar symptoms and feeling better. Supportive measures and OTC medications safe for pregnancy reviewed. Order for COVID kit sent to pharmacy. Will follow-up via direct message with COVID results as soon as she has them to determine next steps.  ? ?Follow Up Instructions: ?I discussed the assessment and treatment plan with the patient. The patient was provided an opportunity to ask questions and all were answered. The patient agreed with the plan and demonstrated an understanding of the instructions.  A copy of instructions were sent to the patient via MyChart unless otherwise noted below.  ? ?The patient was advised to call back or seek an in-person evaluation if the symptoms worsen or if the condition fails to improve  as anticipated. ? ?Time:  ?I spent 10 minutes with the patient via telehealth technology discussing the above problems/concerns.   ? ?Molly Rio, PA-C ?

## 2021-08-24 NOTE — Progress Notes (Signed)
?  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   ?EDC: 01/03/2022 LMP: 03/29/2021 Referring Provider:  ?Gerrit Heck, CNM  ?EGA: [redacted]w[redacted]d Genetic Counselor: ?Teena Dunk, MS, CGC  ?OB Hx: G1P0 Date of Appointment: 08/23/2021  ?Accompanied by: The father of the current pregnancy Face to Face Time: 30 Minutes  ? ?Previous Testing Completed: ?[facility] previously completed Non-Invasive Prenatal Screening (NIPS) in this pregnancy. The result is low risk. This screening significantly reduces the risk that the current pregnancy has Down syndrome, Trisomy 48, Trisomy 33, and common sex chromosome conditions, however, the risk is not zero given the limitations of NIPS. Additionally, there are many genetic conditions that cannot be detected by NIPS.  ?Molly Rose previously completed carrier screening. She screened to have an increased chance to be a silent 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. She screened to not be a carrier for Cystic Fibrosis (CF), alpha thalassemia, and beta hemoglobinopathies. A negative result on carrier screening reduces the likelihood of being a carrier, however, does not entirely rule out the possibility. ?Molly Rose previously completed a maternal serum AFP screen in this pregnancy. The result is screen negative. A negative result reduces the risk that the current pregnancy has an open neural tube defect. Closed neural tube defects and some open defects may not be detected by this screen.   ?   ?Genetic Counseling:  ? 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 SMA is an autosomal recessive disease caused by loss of function mutations in the SMN1 gene. Molly Rose screened to have two functional copies of the SMN1 gene, however, due to limitations of genetic screening the laboratory cannot confirm if Molly Rose's two copies are on the same chromosome or on opposite chromosomes. The laboratory identified the variant [id number] in Molly Rose's  screening, meaning there is increased risk for Molly Rose's two copies of the SMN1 gene to be on the same chromosome. Specifically, given Molly Rose's ethnic background, her risk to be a silent carrier is approximately 1 in 140. Genetic counseling reviewed with Molly Rose that if her two copies are on the same chromosome that means her other chromosome would have zero copies, and there could be risk for an affected pregnancy if her reproductive partner is found to be a 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. Individuals with SMA have progressive, proximal, and symmetrical muscle weakness and atrophy due to degeneration of motor neurons of the spine and brainstem. Symptoms can first appear at any time between before birth to adulthood. There are five types of SMA that are historically classified based on clinical presentation, onset of symptoms, and the maximum skill level of motor milestones achieved, however, there is a lot of overlap between types. There are several treatments available for individuals affected with SMA and studies show that treatment is most effective when it is started in the first few months of life. Given Molly Rose's carrier screening result, genetic counseling offered screening for her reproductive partner for SMA. Molly Rose's partner declined carrier screening for SMA. Genetic counseling reviewed with the couple that SMA is included in Molly Rose's newborn screening program. ?  ?  ?Patient Plan: ? ?Proceed with: Routine prenatal care ?Informed consent was obtained. All questions were answered. ? ?Declined: Carrier Screening for Molly Rose's reproductive partner for SMA  ? ?Thank you for sharing in the care of Molly Rose with [place].  ?Please do not hesitate to contact us if you have any questions. ? ?Teena Dunk, MS, CGC ?Certified Genetic Counselor ?

## 2021-08-27 ENCOUNTER — Encounter: Payer: Self-pay | Admitting: Advanced Practice Midwife

## 2021-08-27 ENCOUNTER — Ambulatory Visit (INDEPENDENT_AMBULATORY_CARE_PROVIDER_SITE_OTHER): Payer: Medicaid Other | Admitting: Advanced Practice Midwife

## 2021-08-27 VITALS — BP 108/53 | HR 72 | Wt 123.0 lb

## 2021-08-27 DIAGNOSIS — R051 Acute cough: Secondary | ICD-10-CM

## 2021-08-27 DIAGNOSIS — Z3A21 21 weeks gestation of pregnancy: Secondary | ICD-10-CM

## 2021-08-27 DIAGNOSIS — O2342 Unspecified infection of urinary tract in pregnancy, second trimester: Secondary | ICD-10-CM

## 2021-08-27 DIAGNOSIS — J069 Acute upper respiratory infection, unspecified: Secondary | ICD-10-CM

## 2021-08-27 DIAGNOSIS — Z3400 Encounter for supervision of normal first pregnancy, unspecified trimester: Secondary | ICD-10-CM

## 2021-08-27 MED ORDER — BENZONATATE 100 MG PO CAPS: 200.0000 mg | ORAL_CAPSULE | Freq: Three times a day (TID) | ORAL | 0 refills | Status: DC | PRN

## 2021-08-27 MED ORDER — FLUTICASONE PROPIONATE 50 MCG/ACT NA SUSP: 2.0000 | Freq: Every day | NASAL | 2 refills | Status: DC

## 2021-08-27 NOTE — Progress Notes (Signed)
? ?  PRENATAL VISIT NOTE ? ?Subjective:  ?Molly Rose is a 21 y.o. G1P0 at [redacted]w[redacted]d being seen today for ongoing prenatal care.  She is currently monitored for the following issues for this low-risk pregnancy and has Migraine without aura and without status migrainosus, not intractable; Complicated migraine; Episodic tension-type headache; Supervision of normal first pregnancy, antepartum; Carrier of spinal muscular atrophy; and UTI in pregnancy, second trimester on their problem list. ? ?Patient reports  URI x 1 week, fever, sore throat 1 week ago that resolved, now residual cough and some drainage .  Contractions: Not present. Vag. Bleeding: None.  Movement: Present. Denies leaking of fluid.  ? ?The following portions of the patient's history were reviewed and updated as appropriate: allergies, current medications, past family history, past medical history, past social history, past surgical history and problem list.  ? ?Objective:  ? ?Vitals:  ? 08/27/21 1630  ?BP: (!) 108/53  ?Pulse: 72  ?Weight: 123 lb (55.8 kg)  ? ? ?Fetal Status: Fetal Heart Rate (bpm): 150  Fundal Height: 21 cm Movement: Present    ? ?General:  Alert, oriented and cooperative. Patient is in no acute distress.  ?Skin: Skin is warm and dry. No rash noted.   ?Cardiovascular: Normal heart rate noted  ?Respiratory: Normal respiratory effort, no problems with respiration noted  ?Abdomen: Soft, gravid, appropriate for gestational age.  Pain/Pressure: Absent     ?Pelvic: Cervical exam deferred        ?Extremities: Normal range of motion.  Edema: None  ?Mental Status: Normal mood and affect. Normal behavior. Normal judgment and thought content.  ? ?Assessment and Plan:  ?Pregnancy: G1P0 at [redacted]w[redacted]d ?1. UTI in pregnancy, second trimester ?--On Macrobid for suppression ?--Urine culture today ? ?2. Supervision of normal first pregnancy, antepartum ?--Anticipatory guidance about next visits/weeks of pregnancy given.  ? ? ?3. [redacted] weeks gestation of  pregnancy ? ?4. Viral upper respiratory tract infection ?--Pt symptom onset 7 days ago, improved symptoms now but with residual cough/drainage ?-Continue Mucinex, add flonase and Tessalon Perles (see cough below) ?--F/U if persists another 4-5 days or if symptoms worsen ? ?- fluticasone (FLONASE) 50 MCG/ACT nasal spray; Place 2 sprays into both nostrils daily.  Dispense: 15.8 mL; Refill: 2 ? ?5. Acute cough ? ?- benzonatate (TESSALON PERLES) 100 MG capsule; Take 2 capsules (200 mg total) by mouth 3 (three) times daily as needed for cough.  Dispense: 20 capsule; Refill: 0  ? ?Preterm labor symptoms and general obstetric precautions including but not limited to vaginal bleeding, contractions, leaking of fluid and fetal movement were reviewed in detail with the patient. ?Please refer to After Visit Summary for other counseling recommendations.  ? ?Return in about 4 weeks (around 09/24/2021) for LOB, Any provider, GTT at next visit. ? ?Future Appointments  ?Date Time Provider [place]  ?09/24/2021  4:10 PM Leftwich-Kirby, Kathie Dike, CNM [facility] None  ?10/12/2021 11:00 AM Teague Bobbye Morton, PA-C [facility]  ? ? ?Fatima Blank, CNM  ?

## 2021-08-30 LAB — URINE CULTURE, OB REFLEX

## 2021-08-30 LAB — CULTURE, OB URINE

## 2021-09-04 ENCOUNTER — Telehealth: Payer: Medicaid Other | Admitting: Physician Assistant

## 2021-09-04 DIAGNOSIS — R058 Other specified cough: Secondary | ICD-10-CM

## 2021-09-04 NOTE — Patient Instructions (Signed)
?[name], thank you for joining [name], PA-C for today's virtual visit.  While this provider is not your primary care provider (PCP), if your PCP is located in our provider database this encounter information will be shared with them immediately following your visit. ? ?Consent: ?(Patient) Rami Waddle provided verbal consent for this virtual visit at the beginning of the encounter. ? ?Current Medications: ? ?Current Outpatient Medications:  ?  benzonatate (TESSALON PERLES) 100 MG capsule, Take 2 capsules (200 mg total) by mouth 3 (three) times daily as needed for cough., Disp: 20 capsule, Rfl: 0 ?  Blood Pressure Monitoring (BLOOD PRESSURE KIT) DEVI, 1 kit by Does not apply route as needed., Disp: 1 each, Rfl: 0 ?  COVID-19 At-Home Test KIT, Use as directed by box to test for COVID., Disp: 1 kit, Rfl: 0 ?  cyclobenzaprine (FLEXERIL) 10 MG tablet, Take 1 tablet (10 mg total) by mouth 3 (three) times daily as needed (headache)., Disp: 30 tablet, Rfl: 1 ?  dicyclomine (BENTYL) 20 MG tablet, Take 1 tablet (20 mg total) by mouth 2 (two) times daily for 7 days., Disp: 14 tablet, Rfl: 0 ?  docusate sodium (COLACE) 100 MG capsule, Take 1 capsule (100 mg total) by mouth 2 (two) times daily as needed. (Patient not taking: Reported on 07/30/2021), Disp: 30 capsule, Rfl: 0 ?  EPINEPHrine 0.3 mg/0.3 mL IJ SOAJ injection, Inject 0.3 mg into the muscle as needed for anaphylaxis., Disp: 1 each, Rfl: 1 ?  fluticasone (FLONASE) 50 MCG/ACT nasal spray, Place 2 sprays into both nostrils daily., Disp: 15.8 mL, Rfl: 2 ?  metoCLOPramide (REGLAN) 10 MG tablet, Take 1 tablet (10 mg total) by mouth every 8 (eight) hours as needed for nausea (or headache)., Disp: 30 tablet, Rfl: 1 ?  Misc. Devices (GOJJI WEIGHT SCALE) MISC, 1 Device by Does not apply route as needed., Disp: 1 each, Rfl: 0 ?  Prenatal Vit-Fe Fumarate-FA (PRENATAL VITAMIN) 27-0.8 MG TABS, Take 1 tablet by mouth daily., Disp: 30 tablet, Rfl: 12   ? ?Medications ordered in this encounter:  ?No orders of the defined types were placed in this encounter. ?  ? ?*If you need refills on other medications prior to your next appointment, please contact your pharmacy* ? ?Follow-Up: ?Call back or seek an in-person evaluation if the symptoms worsen or if the condition fails to improve as anticipated. ? ?Other Instructions ?Please keep well-hydrated and get plenty of rest. ?Start a saline nasal rinse. ?Run a humidifier in the bedroom at night. ?Continue the medications given to you by your OB for the cough. ?You can take OTC chlorpheniramine (Brand Chlor-trimeton) as directed and OTC Sudafed (plain sudafed) to help further with symptoms. If not improving/resolving I want you to follow-up with your OB.  ? ? ?If you have been instructed to have an in-person evaluation today at a local Urgent Care facility, please use the link below. It will take you to a list of all of our available [facility], including address, phone number and hours of operation. Please do not delay care.  ?[facility] ? ?If you or a family member do not have a primary care provider, use the link below to schedule a visit and establish care. When you choose a [facility] primary care physician or advanced practice provider, you gain a long-term partner in health. ?Find a Primary Care Provider ? ?Learn more about [facility]'s in-office and virtual care options: ?[facility]  ?

## 2021-09-04 NOTE — Progress Notes (Signed)
?Virtual Visit Consent  ? ?Molly Rose, you are scheduled for a virtual visit with a [facility] provider today.   ?  ?Just as with appointments in the office, your consent must be obtained to participate.  Your consent will be active for this visit and any virtual visit you may have with one of our providers in the next 365 days.   ?  ?If you have a MyChart account, a copy of this consent can be sent to you electronically.  All virtual visits are billed to your insurance company just like a traditional visit in the office.   ? ?As this is a virtual visit, video technology does not allow for your provider to perform a traditional examination.  This may limit your provider's ability to fully assess your condition.  If your provider identifies any concerns that need to be evaluated in person or the need to arrange testing (such as labs, EKG, etc.), we will make arrangements to do so.   ?  ?Although advances in technology are sophisticated, we cannot ensure that it will always work on either your end or our end.  If the connection with a video visit is poor, the visit may have to be switched to a telephone visit.  With either a video or telephone visit, we are not always able to ensure that we have a secure connection.    ? ?I need to obtain your verbal consent now.   Are you willing to proceed with your visit today?  ?  ?Molly Rose has provided verbal consent on 09/04/2021 for a virtual visit (video or telephone). ?  ?Molly Rio, PA-C  ? ?Date: 09/04/2021 10:14 AM ? ? ?Virtual Visit via Video Note  ? ?I, Molly Rose, connected with  Molly Rose  ([id number], 2000-07-11) on 09/04/21 at 10:00 AM EDT by a video-enabled telemedicine application and verified that I am speaking with the correct person using two identifiers. ? ?Location: ?Patient: Virtual Visit Location Patient: Home ?Provider: Virtual Visit Location Provider: Home Office ?  ?I discussed the limitations of evaluation and  management by telemedicine and the availability of in person appointments. The patient expressed understanding and agreed to proceed.   ? ?History of Present Illness: ?Molly Rose is a 21 y.o. who identifies as a female who was assigned female at birth, and is being seen today for residual cough after recent URI with some occasional nasal congestion and runny nose. Was evaluated via video by this provider on 08/23/2021 at which time she was instructed to test for COVID -- which came back negative. Supportive measures and OTC medications reviewed at that time. Had an unrelated follow-up with her OB on 08/27/2021 and given Tessalon and Flonase to take as well as antibiotic for a UTI in pregnancy. Still with some dry cough that is episodic and associated with some wheezing. No chest tightness or shortness of breath. Denies fever, chills or aches. Endorses sinus pressure without pain. Is unsure of what she can take with medications given by OB. ? ?HPI: HPI  ?Problems:  ?Patient Active Problem List  ? Diagnosis Date Noted  ? UTI in pregnancy, second trimester 07/25/2021  ? 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 Supervision of normal first pregnancy, antepartum 06/22/2021  ? Migraine without aura and without status migrainosus, not intractable 09/18/2015  ? Complicated migraine 50/01/3817  ? Episodic tension-type headache 09/18/2015  ?  ?Allergies:  ?Allergies  ?Allergen Reactions  ?  Rocephin [Ceftriaxone] Anaphylaxis  ?  See MAU note from 07/25/2021. Hives on face, anaphylaxis reaction with drop in O2 saturation.   ? Other   ?  Seasonal Allergies  ? ?Medications:  ?Current Outpatient Medications:  ?  benzonatate (TESSALON PERLES) 100 MG capsule, Take 2 capsules (200 mg total) by mouth 3 (three) times daily as needed for cough., Disp: 20 capsule, Rfl: 0 ?  Blood Pressure Monitoring (BLOOD PRESSURE KIT) DEVI, 1 kit by Does not apply route as needed., Disp: 1 each, Rfl: 0 ?  COVID-19 At-Home Test KIT, Use as  directed by box to test for COVID., Disp: 1 kit, Rfl: 0 ?  cyclobenzaprine (FLEXERIL) 10 MG tablet, Take 1 tablet (10 mg total) by mouth 3 (three) times daily as needed (headache)., Disp: 30 tablet, Rfl: 1 ?  dicyclomine (BENTYL) 20 MG tablet, Take 1 tablet (20 mg total) by mouth 2 (two) times daily for 7 days., Disp: 14 tablet, Rfl: 0 ?  docusate sodium (COLACE) 100 MG capsule, Take 1 capsule (100 mg total) by mouth 2 (two) times daily as needed. (Patient not taking: Reported on 07/30/2021), Disp: 30 capsule, Rfl: 0 ?  EPINEPHrine 0.3 mg/0.3 mL IJ SOAJ injection, Inject 0.3 mg into the muscle as needed for anaphylaxis., Disp: 1 each, Rfl: 1 ?  fluticasone (FLONASE) 50 MCG/ACT nasal spray, Place 2 sprays into both nostrils daily., Disp: 15.8 mL, Rfl: 2 ?  metoCLOPramide (REGLAN) 10 MG tablet, Take 1 tablet (10 mg total) by mouth every 8 (eight) hours as needed for nausea (or headache)., Disp: 30 tablet, Rfl: 1 ?  Misc. Devices (GOJJI WEIGHT SCALE) MISC, 1 Device by Does not apply route as needed., Disp: 1 each, Rfl: 0 ?  Prenatal Vit-Fe Fumarate-FA (PRENATAL VITAMIN) 27-0.8 MG TABS, Take 1 tablet by mouth daily., Disp: 30 tablet, Rfl: 12 ? ?Observations/Objective: ?Patient is well-developed, well-nourished in no acute distress.  ?Resting comfortably at home.  ?Head is normocephalic, atraumatic.  ?No labored breathing. ?Speech is clear and coherent with logical content.  ?Patient is alert and oriented at baseline.  ? ?Assessment and Plan: ?1. Post-viral cough syndrome ? ?Also with likely allergic rhinitis. Will have her start antihistamine safe in pregnancy -- chlorpheniramine recommended. Also can use OTC Sudafed for congestion as out of first trimester. Avoid any use of combination products.  Continue Tessalon per her OB orders. Follow-up in person if not resolving.  ? ?Follow Up Instructions: ?I discussed the assessment and treatment plan with the patient. The patient was provided an opportunity to ask questions and  all were answered. The patient agreed with the plan and demonstrated an understanding of the instructions.  A copy of instructions were sent to the patient via MyChart unless otherwise noted below.  ? ? ?The patient was advised to call back or seek an in-person evaluation if the symptoms worsen or if the condition fails to improve as anticipated. ? ?Time:  ?I spent 10 minutes with the patient via telehealth technology discussing the above problems/concerns.   ? ?Molly Rio, PA-C ?

## 2021-09-10 ENCOUNTER — Other Ambulatory Visit: Payer: Self-pay

## 2021-09-10 ENCOUNTER — Ambulatory Visit
Admission: EM | Admit: 2021-09-10 | Discharge: 2021-09-10 | Disposition: A | Discharge status: Home / Self Care | Payer: Medicaid Other

## 2021-09-10 ENCOUNTER — Inpatient Hospital Stay (HOSPITAL_COMMUNITY)
Admission: AD | Admit: 2021-09-10 | Discharge: 2021-09-10 | Disposition: A | Discharge status: Home / Self Care | Payer: Medicaid Other | Attending: Family Medicine | Admitting: Family Medicine

## 2021-09-10 ENCOUNTER — Encounter (HOSPITAL_COMMUNITY): Payer: Self-pay | Admitting: Obstetrics and Gynecology

## 2021-09-10 ENCOUNTER — Telehealth: Payer: Medicaid Other | Admitting: Physician Assistant

## 2021-09-10 DIAGNOSIS — O26892 Other specified pregnancy related conditions, second trimester: Secondary | ICD-10-CM | POA: Diagnosis present

## 2021-09-10 DIAGNOSIS — N390 Urinary tract infection, site not specified: Secondary | ICD-10-CM | POA: Diagnosis not present

## 2021-09-10 DIAGNOSIS — B3731 Acute candidiasis of vulva and vagina: Secondary | ICD-10-CM

## 2021-09-10 DIAGNOSIS — O2342 Unspecified infection of urinary tract in pregnancy, second trimester: Secondary | ICD-10-CM | POA: Diagnosis not present

## 2021-09-10 DIAGNOSIS — Z3A23 23 weeks gestation of pregnancy: Secondary | ICD-10-CM | POA: Diagnosis not present

## 2021-09-10 DIAGNOSIS — B9789 Other viral agents as the cause of diseases classified elsewhere: Secondary | ICD-10-CM | POA: Diagnosis not present

## 2021-09-10 DIAGNOSIS — B379 Candidiasis, unspecified: Secondary | ICD-10-CM

## 2021-09-10 DIAGNOSIS — T3695XA Adverse effect of unspecified systemic antibiotic, initial encounter: Secondary | ICD-10-CM

## 2021-09-10 DIAGNOSIS — R300 Dysuria: Secondary | ICD-10-CM

## 2021-09-10 DIAGNOSIS — Z3490 Encounter for supervision of normal pregnancy, unspecified, unspecified trimester: Secondary | ICD-10-CM

## 2021-09-10 DIAGNOSIS — O98812 Other maternal infectious and parasitic diseases complicating pregnancy, second trimester: Secondary | ICD-10-CM | POA: Diagnosis not present

## 2021-09-10 LAB — WET PREP, GENITAL
Clue Cells Wet Prep HPF POC: NONE SEEN
Sperm: NONE SEEN
Trich, Wet Prep: NONE SEEN
WBC, Wet Prep HPF POC: <10 (ref <10)
Yeast Wet Prep HPF POC: NONE SEEN

## 2021-09-10 LAB — URINALYSIS, ROUTINE W REFLEX MICROSCOPIC
Bilirubin Urine: NEGATIVE
Glucose, UA: NEGATIVE mg/dL
Hgb urine dipstick: NEGATIVE
Ketones, ur: NEGATIVE mg/dL
Leukocytes,Ua: NEGATIVE
Nitrite: NEGATIVE
Protein, ur: NEGATIVE mg/dL
Specific Gravity, Urine: 1.018 (ref 1.005–1.030)
pH: 6 (ref 5.0–8.0)

## 2021-09-10 MED ORDER — TERCONAZOLE 0.4 % VA CREA: 1.0000 | TOPICAL_CREAM | Freq: Every day | VAGINAL | 0 refills | Status: DC

## 2021-09-10 NOTE — Progress Notes (Signed)
Patient having UTI symptoms in pregnancy. Did go to UC but they would not collect UA/Urine Cx. Advised her to go to MAU. I have advised her to proceed there as well for today, since UC had declined treatment as well. She voices understanding and will proceed to MAU. No charge for visit. ?

## 2021-09-10 NOTE — MAU Provider Note (Addendum)
?History  ?  ? ?CSN: [id number] ? ?Arrival date and time: 09/10/21 1603 ? ? None  ?  ? ?Chief Complaint  ?Patient presents with  ? Vaginal Discharge  ? Abdominal Pain  ? Dysuria  ? ?Molly Rose is a 21 y.o. F G1P0  w/ pmh migraine and recurrent UTIs on daily Macrobid who presents with dysuria, urinary urgency, and intermittent cramping since Saturday. She is still feeling good fetal movement. She denies any blood in her urine, fever/chills, weakness, or back pain. She has had 3 UTIs during this pregnancy. She is sexually active with 1 partner and they are monogamous. She had an STI screening 1 month ago, which was negative. She has also had increasing vaginal discharge, described as white and clumpy. She does not have any vaginal itching or irritation.  ? ? ?OB History   ? ? Gravida  ?1  ? Para  ?   ? Term  ?   ? Preterm  ?   ? AB  ?   ? Living  ?0  ?  ? ? SAB  ?   ? IAB  ?   ? Ectopic  ?   ? Multiple  ?   ? Live Births  ?   ?   ?  ?  ? ? ?Past Medical History:  ?Diagnosis Date  ? Migraine   ? UTI (urinary tract infection)   ? ? ?Past Surgical History:  ?Procedure Laterality Date  ? WISDOM TOOTH EXTRACTION    ? ? ?Family History  ?Problem Relation Age of Onset  ? Healthy Mother   ? Healthy Father   ? ? ?Social History  ? ?Tobacco Use  ? Smoking status: Never  ? Smokeless tobacco: Never  ?Vaping Use  ? Vaping Use: Former  ?Substance Use Topics  ? Alcohol use: No  ?  Alcohol/week: 0.0 standard drinks  ? Drug use: No  ? ? ?Allergies:  ?Allergies  ?Allergen Reactions  ? Rocephin [Ceftriaxone] Anaphylaxis  ?  See MAU note from 07/25/2021. Hives on face, anaphylaxis reaction with drop in O2 saturation.   ? Other   ?  Seasonal Allergies  ? ? ?No medications prior to admission.  ? ? ?Review of Systems  ?Constitutional: Negative.   ?Respiratory: Negative.    ?Cardiovascular: Negative.   ?Gastrointestinal:  Positive for abdominal pain.  ?Genitourinary:  Positive for dysuria, urgency and vaginal discharge.  ?Musculoskeletal:  Negative.   ?Skin: Negative.   ?Physical Exam  ? ?Blood pressure 105/66, pulse 78, temperature 98.3 ?F (36.8 ?C), temperature source Oral, resp. rate 16, weight 57.1 kg, last menstrual period 03/29/2021, SpO2 99 %. ? ?Physical Exam ?Constitutional:   ?   Appearance: She is well-developed.  ?HENT:  ?   Head: Normocephalic and atraumatic.  ?Cardiovascular:  ?   Rate and Rhythm: Normal rate and regular rhythm.  ?Pulmonary:  ?   Effort: Pulmonary effort is normal.  ?   Breath sounds: Normal breath sounds.  ?Abdominal:  ?   Palpations: Abdomen is soft.  ?   Tenderness: There is no abdominal tenderness. There is no right CVA tenderness or left CVA tenderness.  ?Skin: ?   General: Skin is warm and dry.  ?Neurological:  ?   Mental Status: She is alert.  ? ? ?MAU Course  ? ? ?Results for orders placed or performed during the hospital encounter of 09/10/21 (from the past 24 hour(s))  ?Urinalysis, Routine w reflex microscopic Urine, Clean Catch  Status: None  ? Collection Time: 09/10/21  4:24 PM  ?Result Value Ref Range  ? Color, Urine YELLOW YELLOW  ? APPearance CLEAR CLEAR  ? Specific Gravity, Urine 1.018 1.005 - 1.030  ? pH 6.0 5.0 - 8.0  ? Glucose, UA NEGATIVE NEGATIVE mg/dL  ? Hgb urine dipstick NEGATIVE NEGATIVE  ? Bilirubin Urine NEGATIVE NEGATIVE  ? Ketones, ur NEGATIVE NEGATIVE mg/dL  ? Protein, ur NEGATIVE NEGATIVE mg/dL  ? Nitrite NEGATIVE NEGATIVE  ? Leukocytes,Ua NEGATIVE NEGATIVE  ?Wet prep, genital     Status: None  ? Collection Time: 09/10/21  4:58 PM  ? Specimen: Vaginal  ?Result Value Ref Range  ? Yeast Wet Prep HPF POC NONE SEEN NONE SEEN  ? Trich, Wet Prep NONE SEEN NONE SEEN  ? Clue Cells Wet Prep HPF POC NONE SEEN NONE SEEN  ? WBC, Wet Prep HPF POC <10 <10  ? Sperm NONE SEEN   ? ? ?Assessment and Plan  ?Urinary tract infection: Patient takes daily Macrobid for her frequent UTIs. UA from 4/3 positive for E.coli but she was not treated, so current sx most likely continuation of 4/3 infection. Yeast  infection is possible, given pt's description of white, clumpy discharge.   ? - wet prep negative, GC/chlamydia pending ? - UA negative, culture pending ? - empirically treat for yeast infection with Terconazole cream ? - continue daily Macrobid  ? ?Josefa HalfChengyu Weng MS3 ?09/10/2021, 5:35 PM  ? ? ?Attestation of Supervision of Student:  I confirm that I have verified the information documented in the medical student?s note and that I have also personally performed the history, physical exam and all medical decision making activities.  I have verified that all services and findings are accurately documented in this student's note; and I agree with management and plan as outlined in the documentation. I have also made any necessary editorial changes. ? ?History ?Molly Rose is a 21 y.o. G1P0 at 8530w4d who presents with dysuria & vaginal discharge. Has had 3 UTIs during this pregnancy & is currently taking daily macrobid for suppression. Reports vaginal irritation & clumpy white discharge that started this week. Denies hematuria, flank pain, fever, or vomiting.  ? ?Physical exam ?Patient Vitals for the past 24 hrs: ? BP Temp Temp src Pulse Resp SpO2 Weight  ?09/10/21 1757 105/66 -- -- 78 -- -- --  ?09/10/21 1654 (!) 102/54 -- -- 71 -- -- --  ?09/10/21 1645 (!) 101/51 -- -- 70 -- 99 % --  ?09/10/21 1624 (!) 101/50 98.3 ?F (36.8 ?C) Oral 80 16 100 % --  ?09/10/21 1613 -- -- -- -- -- -- 57.1 kg  ? ?Physical Examination: General appearance - alert, well appearing, and in no distress ?Mental status - normal mood, behavior, speech, dress, motor activity, and thought processes ?Eyes - sclera anicteric ?Chest - normal respiratory effort ?Abdomen - no CVA tenderness ?Skin - warm & dry ? ? ?NST:  Baseline: 145 bpm, Variability: Good {> 6 bpm), Accelerations: 10x10, and Decelerations: Absent ? ? ?Assessment/Plan ?1. Vaginal yeast infection  ?-Wet prep negative but discharge consistent with yeast. Likely due to pregnancy & daily  antibiotics. Rx terazol.   ?2. Antibiotic-induced yeast infection   ?3. Recurrent UTI (urinary tract infection) complicating pregnancy, second trimester  ?-Continue marcobid for suppression. Urinalysis today is negative. Will send for culture. Reviewed reasons to return to MAU.   ?4. [redacted] weeks gestation of pregnancy   ? ? ? ?Judeth HornErin Myosha Cuadras, NP ?[facility] ?09/10/2021 6:31 PM ? ?

## 2021-09-10 NOTE — [facility] Note (Signed)
[name] is a 21 y.o. at [redacted]w[redacted]d here in [facility]U reporting: since she had intercourse on Saturday, she has had burning with urination.  Didn't know if she had a UTI again or what. 'it's pretty bad.  Had some cramping this morning, it has stopped. No bleeding or LOF.  Reports thick white d/c.  ? ?Onset of complaint: Sat ?Pain score: 8 ?Vitals:  ? 09/10/21 1624  ?BP: (!) 101/50  ?Pulse: 80  ?Resp: 16  ?Temp: 98.3 ?F (36.8 ?C)  ?SpO2: 100%  ?   ?FHT:152 ?Lab orders placed from triage:  urine ?

## 2021-09-11 LAB — GC/CHLAMYDIA PROBE AMP (~~LOC~~) NOT AT ARMC
Chlamydia: NEGATIVE
Comment: NEGATIVE
Comment: NORMAL
Neisseria Gonorrhea: NEGATIVE

## 2021-09-12 LAB — CULTURE, OB URINE: Culture: <10000 — AB

## 2021-09-19 ENCOUNTER — Telehealth: Payer: Medicaid Other | Admitting: Physician Assistant

## 2021-09-19 DIAGNOSIS — O26892 Other specified pregnancy related conditions, second trimester: Secondary | ICD-10-CM | POA: Diagnosis not present

## 2021-09-19 DIAGNOSIS — R109 Unspecified abdominal pain: Secondary | ICD-10-CM

## 2021-09-19 NOTE — Patient Instructions (Signed)
?Molly Rose, thank you for joining Mar Daring, PA-C for today's virtual visit.  While this provider is not your primary care provider (PCP), if your PCP is located in our provider database this encounter information will be shared with them immediately following your visit. ? ?Consent: ?(Patient) TRUE Molly Rose provided verbal consent for this virtual visit at the beginning of the encounter. ? ?Current Medications: ? ?Current Outpatient Medications:  ?  Blood Pressure Monitoring (BLOOD PRESSURE KIT) DEVI, 1 kit by Does not apply route as needed., Disp: 1 each, Rfl: 0 ?  COVID-19 At-Home Test KIT, Use as directed by box to test for COVID., Disp: 1 kit, Rfl: 0 ?  dicyclomine (BENTYL) 20 MG tablet, Take 1 tablet (20 mg total) by mouth 2 (two) times daily for 7 days., Disp: 14 tablet, Rfl: 0 ?  docusate sodium (COLACE) 100 MG capsule, Take 1 capsule (100 mg total) by mouth 2 (two) times daily as needed., Disp: 30 capsule, Rfl: 0 ?  EPINEPHrine 0.3 mg/0.3 mL IJ SOAJ injection, Inject 0.3 mg into the muscle as needed for anaphylaxis., Disp: 1 each, Rfl: 1 ?  metoCLOPramide (REGLAN) 10 MG tablet, Take 1 tablet (10 mg total) by mouth every 8 (eight) hours as needed for nausea (or headache)., Disp: 30 tablet, Rfl: 1 ?  Misc. Devices (GOJJI WEIGHT SCALE) MISC, 1 Device by Does not apply route as needed., Disp: 1 each, Rfl: 0 ?  nitrofurantoin, macrocrystal-monohydrate, (MACROBID) 100 MG capsule, Take 100 mg by mouth daily. For suppression, Disp: , Rfl:  ?  Prenatal Vit-Fe Fumarate-FA (PRENATAL VITAMIN) 27-0.8 MG TABS, Take 1 tablet by mouth daily., Disp: 30 tablet, Rfl: 12 ?  terconazole (TERAZOL 7) 0.4 % vaginal cream, Place 1 applicator vaginally at bedtime. Use for seven days, Disp: 45 g, Rfl: 0  ? ?Medications ordered in this encounter:  ?No orders of the defined types were placed in this encounter. ?  ? ?*If you need refills on other medications prior to your next appointment, please contact your  pharmacy* ? ?Follow-Up: ?Call back or seek an in-person evaluation if the symptoms worsen or if the condition fails to improve as anticipated. ? ?Other Instructions ?Second Trimester of Pregnancy ? ?The second trimester of pregnancy is from week 13 through week 27. This is months 4 through 6 of pregnancy. The second trimester is often a time when you feel your best. Your body has adjusted to being pregnant, and you begin to feel better physically. ?During the second trimester: ?Morning sickness has lessened or stopped completely. ?You may have more energy. ?You may have an increase in appetite. ?The second trimester is also a time when the unborn baby (fetus) is growing rapidly. At the end of the sixth month, the fetus may be up to 12 inches long and weigh about 1? pounds. You will likely begin to feel the baby move (quickening) between 16 and 20 weeks of pregnancy. ?Body changes during your second trimester ?Your body continues to go through many changes during your second trimester. The changes vary and generally return to normal after the baby is born. ?Physical changes ?Your weight will continue to increase. You will notice your lower abdomen bulging out. ?You may begin to get stretch marks on your hips, abdomen, and breasts. ?Your breasts will continue to grow and to become tender. ?Dark spots or blotches (chloasma or mask of pregnancy) may develop on your face. ?A dark line from your belly button to the pubic area (  linea nigra) may appear. ?You may have changes in your hair. These can include thickening of your hair, rapid growth, and changes in texture. Some people also have hair loss during or after pregnancy, or hair that feels dry or thin. ?Health changes ?You may develop headaches. ?You may have heartburn. ?You may develop constipation. ?You may develop hemorrhoids or swollen, bulging veins (varicose veins). ?Your gums may bleed and may be sensitive to brushing and flossing. ?You may urinate more often  because the fetus is pressing on your bladder. ?You may have back pain. This is caused by: ?Weight gain. ?Pregnancy hormones that are relaxing the joints in your pelvis. ?A shift in weight and the muscles that support your balance. ?Follow these instructions at home: ?Medicines ?Follow your health care provider's instructions regarding medicine use. Specific medicines may be either safe or unsafe to take during pregnancy. Do not take any medicines unless approved by your health care provider. ?Take a prenatal vitamin that contains at least 600 micrograms (mcg) of folic acid. ?Eating and drinking ?Eat a healthy diet that includes fresh fruits and vegetables, whole grains, good sources of protein such as meat, eggs, or tofu, and low-fat dairy products. ?Avoid raw meat and unpasteurized juice, milk, and cheese. These carry germs that can harm you and your baby. ?You may need to take these actions to prevent or treat constipation: ?Drink enough fluid to keep your urine pale yellow. ?Eat foods that are high in fiber, such as beans, whole grains, and fresh fruits and vegetables. ?Limit foods that are high in fat and processed sugars, such as fried or sweet foods. ?Activity ?Exercise only as directed by your health care provider. Most people can continue their usual exercise routine during pregnancy. Try to exercise for 30 minutes at least 5 days a week. Stop exercising if you develop contractions in your uterus. ?Stop exercising if you develop pain or cramping in the lower abdomen or lower back. ?Avoid exercising if it is very hot or humid or if you are at a high altitude. ?Avoid heavy lifting. ?If you choose to, you may have sex unless your health care provider tells you not to. ?Relieving pain and discomfort ?Wear a supportive bra to prevent discomfort from breast tenderness. ?Take warm sitz baths to soothe any pain or discomfort caused by hemorrhoids. Use hemorrhoid cream if your health care provider approves. ?Rest  with your legs raised (elevated) if you have leg cramps or low back pain. ?If you develop varicose veins: ?Wear support hose as told by your health care provider. ?Elevate your feet for 15 minutes, 3-4 times a day. ?Limit salt in your diet. ?Safety ?Wear your seat belt at all times when driving or riding in a car. ?Talk with your health care provider if someone is verbally or physically abusive to you. ?Lifestyle ?Do not use hot tubs, steam rooms, or saunas. ?Do not douche. Do not use tampons or scented sanitary pads. ?Avoid cat litter boxes and soil used by cats. These carry germs that can cause birth defects in the baby and possibly loss of the fetus by miscarriage or stillbirth. ?Do not use herbal remedies, alcohol, illegal drugs, or medicines that are not approved by your health care provider. Chemicals in these products can harm your baby. ?Do not use any products that contain nicotine or tobacco, such as cigarettes, e-cigarettes, and chewing tobacco. If you need help quitting, ask your health care provider. ?General instructions ?During a routine prenatal visit, your health care provider will  do a physical exam and other tests. He or she will also discuss your overall health. Keep all follow-up visits. This is important. ?Ask your health care provider for a referral to a local prenatal education class. ?Ask for help if you have counseling or nutritional needs during pregnancy. Your health care provider can offer advice or refer you to specialists for help with various needs. ?Where to find more information ?American Pregnancy Association: americanpregnancy.org ?[employer]: PoolDevices.com.pt ?Office on Women's Health: KeywordPortfolios.com.br ?Contact a health care provider if you have: ?A headache that does not go away when you take medicine. ?Vision changes or you see spots in front of your eyes. ?Mild pelvic cramps, pelvic pressure, or nagging  pain in the abdominal area. ?Persistent nausea, vomiting, or diarrhea. ?A bad-smelling vaginal discharge or foul-smelling urine. ?Pain when you urinate. ?Sudden or extreme swelling of your face, hands, ankles

## 2021-09-19 NOTE — Progress Notes (Signed)
?Virtual Visit Consent  ? ?Molly Rose, you are scheduled for a virtual visit with a [facility] provider today.   ?  ?Just as with appointments in the office, your consent must be obtained to participate.  Your consent will be active for this visit and any virtual visit you may have with one of our providers in the next 365 days.   ?  ?If you have a MyChart account, a copy of this consent can be sent to you electronically.  All virtual visits are billed to your insurance company just like a traditional visit in the office.   ? ?As this is a virtual visit, video technology does not allow for your provider to perform a traditional examination.  This may limit your provider's ability to fully assess your condition.  If your provider identifies any concerns that need to be evaluated in person or the need to arrange testing (such as labs, EKG, etc.), we will make arrangements to do so.   ?  ?Although advances in technology are sophisticated, we cannot ensure that it will always work on either your end or our end.  If the connection with a video visit is poor, the visit may have to be switched to a telephone visit.  With either a video or telephone visit, we are not always able to ensure that we have a secure connection.    ? ?Also, by engaging in this virtual visit, you consent to the provision of healthcare. Additionally, you authorize for your insurance to be billed (if applicable) for the services provided during this visit.  ? ?I need to obtain your verbal consent now.   Are you willing to proceed with your visit today?  ?  ?Molly Rose has provided verbal consent on 21/26/2023 for a virtual visit (video or telephone). ?  ?Mar Daring, PA-C  ? ?Date: 09/19/2021 2:38 PM ? ? ?Virtual Visit via Video Note  ? ?Molly Rose, connected with  Molly Rose  ([id number], 14-Jan-2001) on 09/19/21 at  2:30 PM EDT by a video-enabled telemedicine application and verified that I am speaking with  the correct person using two identifiers. ? ?Location: ?Patient: Virtual Visit Location Patient: Mobile ?Provider: Virtual Visit Location Provider: Home Office ?  ?I discussed the limitations of evaluation and management by telemedicine and the availability of in person appointments. The patient expressed understanding and agreed to proceed.   ? ?History of Present Illness: ?Molly Rose is a 21 y.o. who identifies as a female who was assigned female at birth, and is being seen today for stomach pain and cramping. Reports felt the baby moving and then had some acute pain in the abdomen following that was quick and sharp, but has resolved on its own, lasting less than a few minutes at a time when it does occur. 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 She is [redacted]w[redacted]d ? ? ?Problems:  ?Patient Active Problem List  ? Diagnosis Date Noted  ? UTI in pregnancy, second trimester 07/25/2021  ? 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 Supervision of normal first pregnancy, antepartum 06/22/2021  ? Migraine without aura and without status migrainosus, not intractable 09/18/2015  ? Complicated migraine 017/[id number] ? Episodic tension-type headache 09/18/2015  ?  ?Allergies:  ?Allergies  ?Allergen Reactions  ? Rocephin [Ceftriaxone] Anaphylaxis  ?  See [facility] note from 07/25/2021. Hives on face, anaphylaxis reaction with drop in O2 saturation.   ?  Other   ?  Seasonal Allergies  ? ?Medications:  ?Current Outpatient Medications:  ?  Blood Pressure Monitoring (BLOOD PRESSURE KIT) DEVI, 1 kit by Does not apply route as needed., Disp: 1 each, Rfl: 0 ?  COVID-19 At-Home Test KIT, Use as directed by box to test for COVID., Disp: 1 kit, Rfl: 0 ?  dicyclomine (BENTYL) 20 MG tablet, Take 1 tablet (20 mg total) by mouth 2 (two) times daily for 7 days., Disp: 14 tablet, Rfl: 0 ?  docusate sodium (COLACE) 100 MG capsule, Take 1 capsule (100 mg total) by mouth 2 (two) times daily as needed., Disp: 30 capsule, Rfl: 0 ?  EPINEPHrine 0.3  mg/0.3 mL IJ SOAJ injection, Inject 0.3 mg into the muscle as needed for anaphylaxis., Disp: 1 each, Rfl: 1 ?  metoCLOPramide (REGLAN) 10 MG tablet, Take 1 tablet (10 mg total) by mouth every 8 (eight) hours as needed for nausea (or headache)., Disp: 30 tablet, Rfl: 1 ?  Misc. Devices (GOJJI WEIGHT SCALE) MISC, 1 Device by Does not apply route as needed., Disp: 1 each, Rfl: 0 ?  nitrofurantoin, macrocrystal-monohydrate, (MACROBID) 100 MG capsule, Take 100 mg by mouth daily. For suppression, Disp: , Rfl:  ?  Prenatal Vit-Fe Fumarate-FA (PRENATAL VITAMIN) 27-0.8 MG TABS, Take 1 tablet by mouth daily., Disp: 30 tablet, Rfl: 12 ?  terconazole (TERAZOL 7) 0.4 % vaginal cream, Place 1 applicator vaginally at bedtime. Use for seven days, Disp: 45 g, Rfl: 0 ? ?Observations/Objective: ?Patient is well-developed, well-nourished in no acute distress.  ?Resting comfortably ?Head is normocephalic, atraumatic.  ?No labored breathing.  ?Speech is clear and coherent with logical content.  ?Patient is alert and oriented at baseline.  ? ? ?Assessment and Plan: ?1. Abdominal pain during pregnancy in second trimester ? ?- Symptoms seem consistent with physiological discomfort in pregnancy with baby movements ?- May also have some component of round ligament pain starting ?- Advised to follow up with OB/GYN  ?- Seek urgent evaluation at [facility] if symptoms worsen or become consistent, or fetal movement stops ? ?Follow Up Instructions: ?I discussed the assessment and treatment plan with the patient. The patient was provided an opportunity to ask questions and all were answered. The patient agreed with the plan and demonstrated an understanding of the instructions.  A copy of instructions were sent to the patient via MyChart unless otherwise noted below.  ? ? ?The patient was advised to call back or seek an in-person evaluation if the symptoms worsen or if the condition fails to improve as anticipated. ? ?Time:  ?I spent 10 minutes with the  patient via telehealth technology discussing the above problems/concerns.   ? ?Mar Daring, PA-C ?

## 2021-09-19 NOTE — Progress Notes (Signed)
I have reviewed the chart and agree with the nurse's note and plan of care for this visit.  ? ?Sharen Counter, CNM ?5:37 PM ? ?

## 2021-09-24 ENCOUNTER — Encounter: Payer: Self-pay | Admitting: Advanced Practice Midwife

## 2021-09-24 ENCOUNTER — Ambulatory Visit (INDEPENDENT_AMBULATORY_CARE_PROVIDER_SITE_OTHER): Payer: Medicaid Other | Admitting: Advanced Practice Midwife

## 2021-09-24 VITALS — BP 117/68 | HR 80 | Wt 127.6 lb

## 2021-09-24 DIAGNOSIS — M79604 Pain in right leg: Secondary | ICD-10-CM

## 2021-09-24 DIAGNOSIS — O26899 Other specified pregnancy related conditions, unspecified trimester: Secondary | ICD-10-CM

## 2021-09-24 DIAGNOSIS — O2342 Unspecified infection of urinary tract in pregnancy, second trimester: Secondary | ICD-10-CM

## 2021-09-24 DIAGNOSIS — B379 Candidiasis, unspecified: Secondary | ICD-10-CM

## 2021-09-24 DIAGNOSIS — O99891 Other specified diseases and conditions complicating pregnancy: Secondary | ICD-10-CM

## 2021-09-24 DIAGNOSIS — M79605 Pain in left leg: Secondary | ICD-10-CM

## 2021-09-24 DIAGNOSIS — Z3A25 25 weeks gestation of pregnancy: Secondary | ICD-10-CM

## 2021-09-24 DIAGNOSIS — Z3400 Encounter for supervision of normal first pregnancy, unspecified trimester: Secondary | ICD-10-CM

## 2021-09-24 DIAGNOSIS — R102 Pelvic and perineal pain: Secondary | ICD-10-CM

## 2021-09-24 DIAGNOSIS — T3695XA Adverse effect of unspecified systemic antibiotic, initial encounter: Secondary | ICD-10-CM

## 2021-09-24 DIAGNOSIS — R252 Cramp and spasm: Secondary | ICD-10-CM

## 2021-09-24 NOTE — Progress Notes (Signed)
Pt presents for ROB reports bilateral calf pain x 1 week.  ?

## 2021-09-24 NOTE — Progress Notes (Signed)
? ?  PRENATAL VISIT NOTE ? ?Subjective:  ?Molly Rose is a 21 y.o. G1P0 at [redacted]w[redacted]d being seen today for ongoing prenatal care.  She is currently monitored for the following issues for this low-risk pregnancy and has Migraine without aura and without status migrainosus, not intractable; Complicated migraine; Episodic tension-type headache; Supervision of normal first pregnancy, antepartum; Carrier of spinal muscular atrophy; and UTI in pregnancy, second trimester on their problem list. ? ?Patient reports  occasional sharp abdominal pain with movement and bilateral leg pain when she gets up at night or in the morning .  Contractions: Not present. Vag. Bleeding: None.  Movement: Present. Denies leaking of fluid.  ? ?The following portions of the patient's history were reviewed and updated as appropriate: allergies, current medications, past family history, past medical history, past social history, past surgical history and problem list.  ? ?Objective:  ? ?Vitals:  ? 09/24/21 1614  ?BP: 117/68  ?Pulse: 80  ?Weight: 127 lb 9.6 oz (57.9 kg)  ? ? ?Fetal Status: Fetal Heart Rate (bpm): 136    Movement: Present    ? ?General:  Alert, oriented and cooperative. Patient is in no acute distress.  ?Skin: Skin is warm and dry. No rash noted.   ?Cardiovascular: Normal heart rate noted  ?Respiratory: Normal respiratory effort, no problems with respiration noted  ?Abdomen: Soft, gravid, appropriate for gestational age.  Pain/Pressure: Absent     ?Pelvic: Cervical exam deferred        ?Extremities: Normal range of motion.  Edema: None  ?Mental Status: Normal mood and affect. Normal behavior. Normal judgment and thought content.  ? ?Assessment and Plan:  ?Pregnancy: G1P0 at [redacted]w[redacted]d ?1. Supervision of normal first pregnancy, antepartum ?--Anticipatory guidance about next visits/weeks of pregnancy given.  ? ?2. Recurrent UTI (urinary tract infection) complicating pregnancy, second trimester ?--TOC after last tx was negative, pt taking  Macrobid daily for prophylaxis ? ?3. Antibiotic-induced yeast infection ?--No s/sx after treatment ? ?4. [redacted] weeks gestation of pregnancy ? ? ?5. Pain in both lower extremities ?--pain is bilateral, no edema or warmth on exam. Calf measurements equal bilaterally.  ?--Warning signs for DVT given ?--C/W leg cramps of pregnancy, teaching given about increased PO fluids, heat, stretches, limited data on electrolytes/magnesium. ? ?6. Pain of round ligament affecting pregnancy, antepartum ?--Rest/ice/heat/warm bath/increase PO fluids/Tylenol/pregnancy support belt ?  ? ?Preterm labor symptoms and general obstetric precautions including but not limited to vaginal bleeding, contractions, leaking of fluid and fetal movement were reviewed in detail with the patient. ?Please refer to After Visit Summary for other counseling recommendations.  ? ?No follow-ups on file. ? ?Future Appointments  ?Date Time Provider Department Center  ?10/12/2021 11:00 AM Teague Edwena Blow, PA-C [facility]  ? ? ?Sharen Counter, CNM  ?

## 2021-09-24 NOTE — Patient Instructions (Signed)
Why am I having leg cramps during pregnancy? ° °No one really knows why pregnant women get more leg cramps. It's possible that your leg muscles are tired from carrying around all of your extra weight. Or they may be aggravated by the pressure your expanding uterus puts on the blood vessels that return blood from your legs to your heart and the nerves that lead from your trunk to your legs. ° °Leg cramps may start to plague you during your second trimester and may get worse as your pregnancy progresses and your belly gets bigger. While these cramps can occur during the day, you'll probably notice them most at night, when they can interfere with your ability to get a good night's sleep. ° °How can I prevent leg cramps? ° °Try these tips for keeping leg cramps at bay: ° °Avoid standing or sitting with your legs crossed for long periods of time. °Stretch your calf muscles regularly during the day and several times before you go to bed. °Rotate your ankles and wiggle your toes when you sit, eat dinner, or watch TV. °Take a walk every day, unless your midwife or doctor has advised you not to exercise. °Avoid getting too tired. Lie down on your left side to improve circulation to and from your legs. °Stay hydrated during the day by drinking water regularly. °Try a warm bath before bed to relax your muscles. °Some research suggests that taking a magnesium supplement in addition to a prenatal vitamin may help some women avoid leg cramps. However, other research showed that magnesium supplements had no significant effect on the frequency or intensity of leg cramps during pregnancy.You may have heard that having leg cramps is a sign that you need more calcium, and that calcium supplements will relieve the problem. Though it's certainly important to get enough calcium, there's no good evidence that taking extra calcium will help prevent leg cramps during pregnancy. In fact, in one well-designed study, pregnant women taking  calcium got no more relief from leg cramps than those taking a placebo. ° °You may try Chelated Magnesium at a dosage of 240-300mg/day. ° °What's the best way to relieve a cramp when I get one? ° °If you do get a cramp, immediately stretch your calf muscles: Straighten your leg, heel first, and gently flex your toes back toward your shins. It might hurt at first, but it will ease the spasm and the pain will gradually go away. ° °You can try to relax the cramp by massaging the muscle or warming it with a hot water bottle. Walking around for a few minutes may help too. ° °What if the pain persists? ° °Call your practitioner if your muscle pain is constant and not just an occasional cramp or if you notice swelling, redness, or tenderness in your leg, or the area feels warm to your touch. These may be signs of a blood clot, which requires immediate medical attention. Blood clots are relatively rare, but they're more common during pregnancy. °  °  ° Tips to Help Leg Cramps °Increase dietary sources of calcium (milk, yogurt, cheese, leafy greens, seafood, legumes, and fruit) and magnesium (dark leafy greens, nuts, seeds, fish, beans, whole grains, avocados, yogurt, bananas, dried fruit, dark chocolate) °Spoonful of regular yellow mustard every night °Pickle juice °Magnesium supplement: 5mmol in the morning, 10mmol at night (can find in the vitamin aisle) °Dorsiflexion of foot: pointing your toes back towards your knee during the cramp °  ° °

## 2021-09-27 ENCOUNTER — Telehealth: Payer: Medicaid Other | Admitting: Physician Assistant

## 2021-09-27 DIAGNOSIS — B349 Viral infection, unspecified: Secondary | ICD-10-CM | POA: Diagnosis not present

## 2021-09-27 NOTE — Patient Instructions (Addendum)
?[name], thank you for joining [name], PA-C for today's virtual visit.  While this provider is not your primary care provider (PCP), if your PCP is located in our provider database this encounter information will be shared with them immediately following your visit. ? ?Consent: ?(Patient) Molly Rose provided verbal consent for this virtual visit at the beginning of the encounter. ? ?Current Medications: ? ?Current Outpatient Medications:  ?  Blood Pressure Monitoring (BLOOD PRESSURE KIT) DEVI, 1 kit by Does not apply route as needed., Disp: 1 each, Rfl: 0 ?  COVID-19 At-Home Test KIT, Use as directed by box to test for COVID. (Patient not taking: Reported on 09/24/2021), Disp: 1 kit, Rfl: 0 ?  dicyclomine (BENTYL) 20 MG tablet, Take 1 tablet (20 mg total) by mouth 2 (two) times daily for 7 days., Disp: 14 tablet, Rfl: 0 ?  docusate sodium (COLACE) 100 MG capsule, Take 1 capsule (100 mg total) by mouth 2 (two) times daily as needed. (Patient not taking: Reported on 09/24/2021), Disp: 30 capsule, Rfl: 0 ?  EPINEPHrine 0.3 mg/0.3 mL IJ SOAJ injection, Inject 0.3 mg into the muscle as needed for anaphylaxis. (Patient not taking: Reported on 09/24/2021), Disp: 1 each, Rfl: 1 ?  metoCLOPramide (REGLAN) 10 MG tablet, Take 1 tablet (10 mg total) by mouth every 8 (eight) hours as needed for nausea (or headache). (Patient not taking: Reported on 09/24/2021), Disp: 30 tablet, Rfl: 1 ?  Misc. Devices (GOJJI WEIGHT SCALE) MISC, 1 Device by Does not apply route as needed., Disp: 1 each, Rfl: 0 ?  nitrofurantoin, macrocrystal-monohydrate, (MACROBID) 100 MG capsule, Take 100 mg by mouth daily. For suppression, Disp: , Rfl:  ?  Prenatal Vit-Fe Fumarate-FA (PRENATAL VITAMIN) 27-0.8 MG TABS, Take 1 tablet by mouth daily., Disp: 30 tablet, Rfl: 12 ?  terconazole (TERAZOL 7) 0.4 % vaginal cream, Place 1 applicator vaginally at bedtime. Use for seven days (Patient not taking: Reported on 09/24/2021), Disp: 45 g,  Rfl: 0  ? ?Medications ordered in this encounter:  ?No orders of the defined types were placed in this encounter. ?  ? ?*If you need refills on other medications prior to your next appointment, please contact your pharmacy* ? ?Follow-Up: ?Call back or seek an in-person evaluation if the symptoms worsen or if the condition fails to improve as anticipated. ? ?Other Instructions ?Please keep well-hydrated and get plenty of rest.  ?Make sure to follow dietary recommendations below ?Continue your prenatal vitamins. ?You can use the medications for nausea given by your OB if needed. ?Put a humidifier in the bedroom and run at night. ?Tylenol and salt water gargles can help with scratchy throat. ?Start a saline nasal rinse 1-2 x daily to flush out your nasal passages. ?Delsym OTC for cough.  ? ? ? ?If you have been instructed to have an in-person evaluation today at a local Urgent Care facility, please use the link below. It will take you to a list of all of our available [facility], including address, phone number and hours of operation. Please do not delay care.  ?[facility] ? ?If you or a family member do not have a primary care provider, use the link below to schedule a visit and establish care. When you choose a [facility] primary care physician or advanced practice provider, you gain a long-term partner in health. ?Find a Primary Care Provider ? ?Learn more about [facility] in-office and virtual care options: ?  [facility]  ?

## 2021-09-27 NOTE — Progress Notes (Signed)
?Virtual Visit Consent  ? ?Molly Rose, you are scheduled for a virtual visit with a [facility] provider today. Just as with appointments in the office, your consent must be obtained to participate. Your consent will be active for this visit and any virtual visit you may have with one of our providers in the next 365 days. If you have a MyChart account, a copy of this consent can be sent to you electronically. ? ?As this is a virtual visit, video technology does not allow for your provider to perform a traditional examination. This may limit your provider's ability to fully assess your condition. If your provider identifies any concerns that need to be evaluated in person or the need to arrange testing (such as labs, EKG, etc.), we will make arrangements to do so. Although advances in technology are sophisticated, we cannot ensure that it will always work on either your end or our end. If the connection with a video visit is poor, the visit may have to be switched to a telephone visit. With either a video or telephone visit, we are not always able to ensure that we have a secure connection. ? ?By engaging in this virtual visit, you consent to the provision of healthcare and authorize for your insurance to be billed (if applicable) for the services provided during this visit. Depending on your insurance coverage, you may receive a charge related to this service. ? ?I need to obtain your verbal consent now. Are you willing to proceed with your visit today? [name] has provided verbal consent on 09/27/2021 for a virtual visit (video or telephone). [name], PA-C ? ?Date: 09/27/2021 2:13 PM ? ?Virtual Visit via Video Note  ? ?I, [name], connected with  Molly Rose  ([id number], [birth year]) on 09/27/21 at  2:00 PM EDT by a video-enabled telemedicine application and verified that I am speaking with the correct person using two identifiers. ? ?Location: ?Patient: Virtual Visit  Location Patient: Home ?Provider: Virtual Visit Location Provider: Home Office ?  ?I discussed the limitations of evaluation and management by telemedicine and the availability of in person appointments. The patient expressed understanding and agreed to proceed.   ? ?History of Present Illness: ?Molly Rose is a 21 y.o. who identifies as a female who was assigned female at birth, and is being seen today for symptoms starting yesterday. Notes she woke up with a mild sore throat, developing some slight nasal congestion and dry cough throughout the day. However last night, started with some abdominal cramping and loose stool. Denies melena, hematochezia or tenesmus. . One initial episode of nausea with vomiting last night but none since. Denies recent travel or known sick contact. Has been able to hydrate well without issue. Has been dealing with abdominal wall pain ongoing for a while due to pregnancy (had evaluation with her OB and via video).  Missed work today due to her symptoms.  ? ?HPI: HPI  ?Problems:  ?Patient Active Problem List  ? Diagnosis Date Noted  ? UTI in pregnancy, second trimester 07/25/2021  ? 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 Supervision of normal first pregnancy, antepartum 06/22/2021  ? Migraine without aura and without status migrainosus, not intractable 09/18/2015  ? Complicated migraine 78/29/5621  ? Episodic tension-type headache 09/18/2015  ?  ?Allergies:  ?Allergies  ?Allergen Reactions  ? Rocephin [Ceftriaxone] Anaphylaxis  ?  See MAU note from 07/25/2021. Hives on face, anaphylaxis reaction  with drop in O2 saturation.   ? Other   ?  Seasonal Allergies  ? ?Medications:  ?Current Outpatient Medications:  ?  Blood Pressure Monitoring (BLOOD PRESSURE KIT) DEVI, 1 kit by Does not apply route as needed., Disp: 1 each, Rfl: 0 ?  COVID-19 At-Home Test KIT, Use as directed by box to test for COVID. (Patient not taking: Reported on 09/24/2021), Disp: 1 kit, Rfl: 0 ?  dicyclomine  (BENTYL) 20 MG tablet, Take 1 tablet (20 mg total) by mouth 2 (two) times daily for 7 days., Disp: 14 tablet, Rfl: 0 ?  docusate sodium (COLACE) 100 MG capsule, Take 1 capsule (100 mg total) by mouth 2 (two) times daily as needed. (Patient not taking: Reported on 09/24/2021), Disp: 30 capsule, Rfl: 0 ?  EPINEPHrine 0.3 mg/0.3 mL IJ SOAJ injection, Inject 0.3 mg into the muscle as needed for anaphylaxis. (Patient not taking: Reported on 09/24/2021), Disp: 1 each, Rfl: 1 ?  metoCLOPramide (REGLAN) 10 MG tablet, Take 1 tablet (10 mg total) by mouth every 8 (eight) hours as needed for nausea (or headache). (Patient not taking: Reported on 09/24/2021), Disp: 30 tablet, Rfl: 1 ?  Misc. Devices (GOJJI WEIGHT SCALE) MISC, 1 Device by Does not apply route as needed., Disp: 1 each, Rfl: 0 ?  nitrofurantoin, macrocrystal-monohydrate, (MACROBID) 100 MG capsule, Take 100 mg by mouth daily. For suppression, Disp: , Rfl:  ?  Prenatal Vit-Fe Fumarate-FA (PRENATAL VITAMIN) 27-0.8 MG TABS, Take 1 tablet by mouth daily., Disp: 30 tablet, Rfl: 12 ?  terconazole (TERAZOL 7) 0.4 % vaginal cream, Place 1 applicator vaginally at bedtime. Use for seven days (Patient not taking: Reported on 09/24/2021), Disp: 45 g, Rfl: 0 ? ?Observations/Objective: ?Patient is well-developed, well-nourished in no acute distress.  ?Resting comfortably at home.  ?Head is normocephalic, atraumatic.  ?No labored breathing. ?Speech is clear and coherent with logical content.  ?Patient is alert and oriented at baseline.  ? ?Assessment and Plan: ?1. Viral illness ? ?Likely just a viral gastroenteritis with her cough and sore throat being related to allergy-related congestion and PND but could be virus causing both. Milder symptoms. No alarm signs/symptoms present. Is hydrating well. Supportive measures and OTC medications reviewed. Has antiemetic from her OB already to use as directed if this recurs. Start [employer]. Recommend saline rinse for nasal congestion, humidifier  in bedroom at night and salt-water gargles for sore throat. Tylenol OTC if needed. Plain Delsym discussed for cough. Strict follow-up precautions reviewed with patient. Work note provided.  ? ?Follow Up Instructions: ?I discussed the assessment and treatment plan with the patient. The patient was provided an opportunity to ask questions and all were answered. The patient agreed with the plan and demonstrated an understanding of the instructions.  A copy of instructions were sent to the patient via MyChart unless otherwise noted below.  ? ?The patient was advised to call back or seek an in-person evaluation if the symptoms worsen or if the condition fails to improve as anticipated. ? ?Time:  ?I spent 10 minutes with the patient via telehealth technology discussing the above problems/concerns.   ? ?[name], PA-C ?

## 2021-10-02 ENCOUNTER — Ambulatory Visit
Admission: EM | Admit: 2021-10-02 | Discharge: 2021-10-02 | Disposition: A | Discharge status: Home / Self Care | Payer: Medicaid Other | Attending: Urgent Care | Admitting: Urgent Care

## 2021-10-02 ENCOUNTER — Encounter: Payer: Self-pay | Admitting: Emergency Medicine

## 2021-10-02 ENCOUNTER — Telehealth: Payer: Medicaid Other

## 2021-10-02 DIAGNOSIS — R197 Diarrhea, unspecified: Secondary | ICD-10-CM

## 2021-10-02 DIAGNOSIS — Z3A26 26 weeks gestation of pregnancy: Secondary | ICD-10-CM | POA: Diagnosis not present

## 2021-10-02 DIAGNOSIS — R110 Nausea: Secondary | ICD-10-CM

## 2021-10-02 DIAGNOSIS — R252 Cramp and spasm: Secondary | ICD-10-CM | POA: Diagnosis not present

## 2021-10-02 MED ORDER — ONDANSETRON 4 MG PO TBDP: 4.0000 mg | ORAL_TABLET | Freq: Three times a day (TID) | ORAL | 0 refills | Status: DC | PRN

## 2021-10-02 NOTE — Discharge Instructions (Addendum)
For diabetes or elevated blood sugar, please make sure you are limiting and avoiding starchy, carbohydrate foods like pasta, breads, sweet breads, pastry, rice, potatoes, desserts. These foods can elevate your blood sugar. Also, limit and avoid drinks that contain a lot of sugar such as sodas, sweet teas, fruit juices.  Drinking plain water will be much more helpful, try 64 ounces of water daily.  It is okay to flavor your water naturally by cutting cucumber, lemon, mint or lime, placing it in a picture with water and drinking it over a period of 24-48 hours as long as it remains refrigerated. ? ?For elevated blood pressure, make sure you are monitoring salt in your diet.  Do not eat restaurant foods and limit processed foods at home. I highly recommend you prepare and cook your own foods at home.  Processed foods include things like frozen meals, pre-seasoned meats and dinners, deli meats, canned foods as these foods contain a high amount of sodium/salt.  Make sure you are paying attention to sodium labels on foods you buy at the grocery store. Buy your spices separately such as garlic powder, onion powder, cumin, cayenne, parsley flakes so that you can avoid seasonings that contain salt. However, salt-free seasonings are available and can be used, an example is Mrs. Dash and includes a lot of different mixtures that do not contain salt. ? ?Lastly, when cooking using oils that are healthier for you is important. This includes olive oil, avocado oil, canola oil. We have discussed a lot of foods to avoid but below is a list of foods that can be very healthy to use in your diet whether it is for diabetes, cholesterol, high blood pressure, or in general healthy eating. ? ?Salads - kale, spinach, cabbage, spring mix, arugula ?Fruits - avocadoes, berries (blueberries, raspberries, blackberries), apples, oranges, pomegranate, grapefruit, kiwi ?Vegetables - asparagus, cauliflower, broccoli, green beans, brussel sprouts,  bell peppers, beets; stay away from or limit starchy vegetables like potatoes, carrots, peas ?Other general foods - kidney beans, egg whites, almonds, walnuts, sunflower seeds, pumpkin seeds, fat free yogurt, almond milk, flax seeds, quinoa, oats  ?Meat - It is better to eat lean meats and limit your red meat including pork to once a week.  Chicken breast are good options as they tend to be leaner sources of good protein. Still be mindful of the sodium labels for the meats you buy. ? ?DO NOT EAT ANY FOODS ON THIS LIST THAT YOU ARE ALLERGIC TO. For more specific needs, I highly recommend consulting a dietician or nutritionist but this can definitely be a good starting point. ? ?

## 2021-10-02 NOTE — ED Triage Notes (Signed)
Pt here with intermittent leg cramps x [redacted] weeks along with diarrhea. Pt is [redacted] weeks pregnant and OB told her to come to an urgent care if the issue continued at an appt on the 1st.  ?

## 2021-10-02 NOTE — ED Provider Notes (Signed)
?[facility] ? ? ?MRN: [id number] DOB: February 15, 2001 ? ?Subjective:  ? ?Molly Rose is a 21 y.o. female presenting for 2 week history of persistent intermittent diarrhea, loose stools.  Has about 2-3 bouts daily mostly after eating foods.  In the past 2 days she has had some cramps of the lower legs bilaterally, mostly in the calves but also to a lesser degree of the thighs.  Hydrates with 1-2 bottles of water daily.  Does not drink many other fluids.  She is also [redacted] weeks pregnant.  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  Has this multiple times per week and sometimes multiple times daily.  No abdominal or pelvic pain, bloody stools, urinary symptoms.  She does have some cramping in the stomach area but only when she is having diarrhea.  She has had some nausea without vomiting.  Has previously tried Zofran but was advised to stop this.  Has not taken it in 3 weeks.  States that she was told she should not need it if she starts to have diarrhea from taking it. ? ?No current facility-administered medications for this encounter. ? ?Current Outpatient Medications:  ?  Blood Pressure Monitoring (BLOOD PRESSURE KIT) DEVI, 1 kit by Does not apply route as needed., Disp: 1 each, Rfl: 0 ?  COVID-19 At-Home Test KIT, Use as directed by box to test for COVID. (Patient not taking: Reported on 09/24/2021), Disp: 1 kit, Rfl: 0 ?  dicyclomine (BENTYL) 20 MG tablet, Take 1 tablet (20 mg total) by mouth 2 (two) times daily for 7 days., Disp: 14 tablet, Rfl: 0 ?  docusate sodium (COLACE) 100 MG capsule, Take 1 capsule (100 mg total) by mouth 2 (two) times daily as needed. (Patient not taking: Reported on 09/24/2021), Disp: 30 capsule, Rfl: 0 ?  EPINEPHrine 0.3 mg/0.3 mL IJ SOAJ injection, Inject 0.3 mg into the muscle as needed for anaphylaxis. (Patient not taking: Reported on 09/24/2021), Disp: 1 each, Rfl: 1 ?  metoCLOPramide (REGLAN) 10 MG  tablet, Take 1 tablet (10 mg total) by mouth every 8 (eight) hours as needed for nausea (or headache). (Patient not taking: Reported on 09/24/2021), Disp: 30 tablet, Rfl: 1 ?  Misc. Devices (GOJJI WEIGHT SCALE) MISC, 1 Device by Does not apply route as needed., Disp: 1 each, Rfl: 0 ?  nitrofurantoin, macrocrystal-monohydrate, (MACROBID) 100 MG capsule, Take 100 mg by mouth daily. For suppression, Disp: , Rfl:  ?  Prenatal Vit-Fe Fumarate-FA (PRENATAL VITAMIN) 27-0.8 MG TABS, Take 1 tablet by mouth daily., Disp: 30 tablet, Rfl: 12 ?  terconazole (TERAZOL 7) 0.4 % vaginal cream, Place 1 applicator vaginally at bedtime. Use for seven days (Patient not taking: Reported on 09/24/2021), Disp: 45 g, Rfl: 0  ? ?Allergies  ?Allergen Reactions  ? Rocephin [Ceftriaxone] Anaphylaxis  ?  See MAU note from 07/25/2021. Hives on face, anaphylaxis reaction with drop in O2 saturation.   ? Other   ?  Seasonal Allergies  ? ? ?Past Medical History:  ?Diagnosis Date  ? Migraine   ? UTI (urinary tract infection)   ?  ? ?Past Surgical History:  ?Procedure Laterality Date  ? WISDOM TOOTH EXTRACTION    ? ? ?Family History  ?Problem Relation Age of Onset  ? Healthy Mother   ? Healthy Father   ? ? ?Social History  ? ?Tobacco Use  ? Smoking status: Never  ?  Smokeless tobacco: Never  ?Vaping Use  ? Vaping Use: Former  ?Substance Use Topics  ? Alcohol use: No  ?  Alcohol/week: 0.0 standard drinks  ? Drug use: No  ? ? ?ROS ? ? ?Objective:  ? ?Vitals: ?BP 115/67   Pulse (!) 105   Temp 98.1 ?F (36.7 ?C)   Resp 20   LMP 03/29/2021   SpO2 98%  ? ?Physical Exam ?Constitutional:   ?   General: She is not in acute distress. ?   Appearance: Normal appearance. She is well-developed. She is not ill-appearing, toxic-appearing or diaphoretic.  ?HENT:  ?   Head: Normocephalic and atraumatic.  ?   Nose: Nose normal.  ?   Mouth/Throat:  ?   Mouth: Mucous membranes are moist.  ?   Pharynx: No oropharyngeal exudate or posterior oropharyngeal erythema.  ?Eyes:  ?    General: No scleral icterus.    ?   Right eye: No discharge.     ?   Left eye: No discharge.  ?   Extraocular Movements: Extraocular movements intact.  ?   Conjunctiva/sclera: Conjunctivae normal.  ?Cardiovascular:  ?   Rate and Rhythm: Normal rate.  ?Pulmonary:  ?   Effort: Pulmonary effort is normal.  ?Abdominal:  ?   General: Bowel sounds are normal. There is no distension.  ?   Palpations: Abdomen is soft. There is no mass.  ?   Tenderness: There is no abdominal tenderness. There is no right CVA tenderness, left CVA tenderness, guarding or rebound.  ?Musculoskeletal:  ?   Right lower leg: No swelling, deformity, lacerations, tenderness or bony tenderness. No edema.  ?   Left lower leg: No swelling, deformity, lacerations, tenderness or bony tenderness. No edema.  ?   Comments: No warmth, erythema, redness.  Negative Homans' sign bilaterally.  ?Skin: ?   General: Skin is warm and dry.  ?Neurological:  ?   General: No focal deficit present.  ?   Mental Status: She is alert and oriented to person, place, and time.  ?Psychiatric:     ?   Mood and Affect: Mood normal.     ?   Behavior: Behavior normal.     ?   Thought Content: Thought content normal.     ?   Judgment: Judgment normal.  ? ? ?Assessment and Plan :  ? ?PDMP not reviewed this encounter. ? ?1. Diarrhea, unspecified type   ?2. Nausea without vomiting   ?3. [redacted] weeks gestation of pregnancy   ?4. Muscle cramps   ? ?Despite her pregnancy and lower leg pain bilaterally I have low suspicion for an acute DVT.  She does have a low Wells criteria.  Physical exam findings do not support that this is the primary diagnosis.  I did advise that she start hydrating much better and eating much healthier including not eating fast food at all anymore.  Discussed healthy diet especially in pregnancy.  Recommended she restart Zofran to help her with her nausea and loose stools.  Low suspicion for an acute infectious process.  Will defer stool culture and antibiotics.   Follow-up with OB as soon as possible. ?  ?Jaynee Eagles, PA-C ?10/02/21 1646 ? ?

## 2021-10-03 LAB — BASIC METABOLIC PANEL
BUN/Creatinine Ratio: 10 (ref 9–23)
BUN: 7 mg/dL (ref 6–20)
CO2: 18 mmol/L — ABNORMAL LOW (ref 20–29)
Calcium: 8.8 mg/dL (ref 8.7–10.2)
Chloride: 106 mmol/L (ref 96–106)
Creatinine, Ser: 0.67 mg/dL (ref 0.57–1.00)
Glucose: 105 mg/dL — ABNORMAL HIGH (ref 70–99)
Potassium: 4.1 mmol/L (ref 3.5–5.2)
Sodium: 139 mmol/L (ref 134–144)
eGFR: 127 mL/min/{1.73_m2} (ref >59)

## 2021-10-03 LAB — CK: Total CK: 30 U/L — ABNORMAL LOW (ref 32–182)

## 2021-10-08 ENCOUNTER — Inpatient Hospital Stay (HOSPITAL_COMMUNITY)
Admission: AD | Admit: 2021-10-08 | Discharge: 2021-10-11 | DRG: 833 | Disposition: A | Discharge status: Home / Self Care | Payer: Medicaid Other | Attending: Obstetrics & Gynecology | Admitting: Obstetrics & Gynecology

## 2021-10-08 ENCOUNTER — Inpatient Hospital Stay (HOSPITAL_COMMUNITY): Payer: Medicaid Other

## 2021-10-08 ENCOUNTER — Other Ambulatory Visit: Payer: Self-pay

## 2021-10-08 DIAGNOSIS — Z3A27 27 weeks gestation of pregnancy: Secondary | ICD-10-CM

## 2021-10-08 DIAGNOSIS — O2300 Infections of kidney in pregnancy, unspecified trimester: Secondary | ICD-10-CM | POA: Diagnosis not present

## 2021-10-08 DIAGNOSIS — Z87440 Personal history of urinary (tract) infections: Secondary | ICD-10-CM

## 2021-10-08 DIAGNOSIS — R1030 Lower abdominal pain, unspecified: Secondary | ICD-10-CM | POA: Diagnosis present

## 2021-10-08 DIAGNOSIS — O2302 Infections of kidney in pregnancy, second trimester: Principal | ICD-10-CM | POA: Diagnosis present

## 2021-10-08 DIAGNOSIS — O2342 Unspecified infection of urinary tract in pregnancy, second trimester: Principal | ICD-10-CM

## 2021-10-08 LAB — URINALYSIS, ROUTINE W REFLEX MICROSCOPIC
Bilirubin Urine: NEGATIVE
Glucose, UA: NEGATIVE mg/dL
Ketones, ur: NEGATIVE mg/dL
Nitrite: NEGATIVE
Protein, ur: 30 mg/dL — AB
Specific Gravity, Urine: 1.014 (ref 1.005–1.030)
WBC, UA: >50 WBC/hpf — ABNORMAL HIGH (ref 0–5)
pH: 7 (ref 5.0–8.0)

## 2021-10-08 LAB — CBC WITH DIFFERENTIAL/PLATELET
Abs Immature Granulocytes: 0.1 10*3/uL — ABNORMAL HIGH (ref 0.00–0.07)
Basophils Absolute: 0.1 10*3/uL (ref 0.0–0.1)
Basophils Relative: 0 %
Eosinophils Absolute: 0 10*3/uL (ref 0.0–0.5)
Eosinophils Relative: 0 %
HCT: 37.2 % (ref 36.0–46.0)
Hemoglobin: 12.5 g/dL (ref 12.0–15.0)
Immature Granulocytes: 1 %
Lymphocytes Relative: 10 %
Lymphs Abs: 1.4 10*3/uL (ref 0.7–4.0)
MCH: 31.9 pg (ref 26.0–34.0)
MCHC: 33.6 g/dL (ref 30.0–36.0)
MCV: 94.9 fL (ref 80.0–100.0)
Monocytes Absolute: 1.2 10*3/uL — ABNORMAL HIGH (ref 0.1–1.0)
Monocytes Relative: 9 %
Neutro Abs: 11.6 10*3/uL — ABNORMAL HIGH (ref 1.7–7.7)
Neutrophils Relative %: 80 %
Platelets: 187 10*3/uL (ref 150–400)
RBC: 3.92 MIL/uL (ref 3.87–5.11)
RDW: 13.1 % (ref 11.5–15.5)
WBC: 14.4 10*3/uL — ABNORMAL HIGH (ref 4.0–10.5)
nRBC: 0 % (ref 0.0–0.2)

## 2021-10-08 MED ORDER — DOCUSATE SODIUM 100 MG PO CAPS
100.0000 mg | ORAL_CAPSULE | Freq: Every day | ORAL | Status: DC
  Administered 2021-10-09 – 2021-10-11 (×3): 100 mg via ORAL
  Filled 2021-10-08 (×3): qty 1

## 2021-10-08 MED ORDER — CALCIUM CARBONATE ANTACID 500 MG PO CHEW: 2.0000 | CHEWABLE_TABLET | ORAL | Status: DC | PRN

## 2021-10-08 MED ORDER — ACETAMINOPHEN 325 MG PO TABS: 650.0000 mg | ORAL_TABLET | ORAL | Status: DC | PRN

## 2021-10-08 MED ORDER — AZTREONAM 1 G IJ SOLR
1.0000 g | Freq: Three times a day (TID) | INTRAMUSCULAR | Status: DC
Start: 2021-10-09 — End: 2021-10-11
  Administered 2021-10-09 – 2021-10-11 (×8): 1 g via INTRAVENOUS
  Filled 2021-10-08 (×14): qty 5

## 2021-10-08 MED ORDER — PRENATAL MULTIVITAMIN CH
1.0000 | ORAL_TABLET | Freq: Every day | ORAL | Status: DC
  Administered 2021-10-09 – 2021-10-11 (×3): 1 via ORAL
  Filled 2021-10-08 (×3): qty 1

## 2021-10-08 MED ORDER — LACTATED RINGERS IV BOLUS
1000.0000 mL | Freq: Once | INTRAVENOUS | Status: AC
  Administered 2021-10-09: 1000 mL via INTRAVENOUS

## 2021-10-08 MED ORDER — LACTATED RINGERS IV SOLN: INTRAVENOUS | Status: DC

## 2021-10-08 MED ORDER — HYDROMORPHONE HCL 1 MG/ML IJ SOLN
1.0000 mg | Freq: Once | INTRAMUSCULAR | Status: AC
  Administered 2021-10-09: 1 mg via INTRAVENOUS
  Filled 2021-10-08: qty 1

## 2021-10-08 NOTE — MAU Note (Addendum)
Pt says at 0800- felt back pain on right side  ?Then has radiated to right lower bad - and pressure  ?Viola ?Last sex- 1 week ago  ?Less baby movement  ?

## 2021-10-08 NOTE — H&P (Signed)
Chief Complaint:  No chief complaint on file. ? ? Event Date/Time  ? First Provider Initiated Contact with Patient 10/08/21 2313   ?  ?HPI: Molly Rose is a 21 y.o. G1P0 at 24w4dwho presents to maternity admissions reporting severe lower abdominal and right flank pain with dysuria, increased urgency with low urine output, but no fever, nausea or vomiting. Has a history of UTI in pregnancy and was put on macrobid for suppression but stopped taking it when her last three urine cultures were negative. Denies vaginal bleeding, leaking of fluid, decreased fetal movement, falls, or recent illness.  ? ?Pregnancy Course: Receives care at [facility], prenatal records reviewed. Uncomplicated pregnancy other than UTI x3. ? ?Past Medical History:  ?Diagnosis Date  ? Migraine   ? UTI (urinary tract infection)   ? ?OB History  ?Gravida Para Term Preterm AB Living  ?1         0  ?SAB IAB Ectopic Multiple Live Births  ?           ?  ?# Outcome Date GA Lbr Len/2nd Weight Sex Delivery Anes PTL Lv  ?1 Current           ? ?Past Surgical History:  ?Procedure Laterality Date  ? WISDOM TOOTH EXTRACTION    ? ?Family History  ?Problem Relation Age of Onset  ? Healthy Mother   ? Healthy Father   ? ?Social History  ? ?Tobacco Use  ? Smoking status: Never  ? Smokeless tobacco: Never  ?Vaping Use  ? Vaping Use: Former  ?Substance Use Topics  ? Alcohol use: No  ?  Alcohol/week: 0.0 standard drinks  ? Drug use: No  ? ?Allergies  ?Allergen Reactions  ? Rocephin [Ceftriaxone] Anaphylaxis  ?  See MAU note from 07/25/2021. Hives on face, anaphylaxis reaction with drop in O2 saturation.   ? Other   ?  Seasonal Allergies  ? ?Medications Prior to Admission  ?Medication Sig Dispense Refill Last Dose  ? Blood Pressure Monitoring (BLOOD PRESSURE KIT) DEVI 1 kit by Does not apply route as needed. 1 each 0   ? COVID-19 At-Home Test KIT Use as directed by box to test for COVID. (Patient not taking: Reported on 09/24/2021) 1 kit 0   ? dicyclomine (BENTYL)  20 MG tablet Take 1 tablet (20 mg total) by mouth 2 (two) times daily for 7 days. 14 tablet 0   ? docusate sodium (COLACE) 100 MG capsule Take 1 capsule (100 mg total) by mouth 2 (two) times daily as needed. (Patient not taking: Reported on 09/24/2021) 30 capsule 0   ? EPINEPHrine 0.3 mg/0.3 mL IJ SOAJ injection Inject 0.3 mg into the muscle as needed for anaphylaxis. (Patient not taking: Reported on 09/24/2021) 1 each 1   ? metoCLOPramide (REGLAN) 10 MG tablet Take 1 tablet (10 mg total) by mouth every 8 (eight) hours as needed for nausea (or headache). (Patient not taking: Reported on 09/24/2021) 30 tablet 1   ? Misc. Devices (GOJJI WEIGHT SCALE) MISC 1 Device by Does not apply route as needed. 1 each 0   ? nitrofurantoin, macrocrystal-monohydrate, (MACROBID) 100 MG capsule Take 100 mg by mouth daily. For suppression     ? ondansetron (ZOFRAN-ODT) 4 MG disintegrating tablet Take 1 tablet (4 mg total) by mouth every 8 (eight) hours as needed for nausea or vomiting. 30 tablet 0   ? Prenatal Vit-Fe Fumarate-FA (PRENATAL VITAMIN) 27-0.8 MG TABS Take 1 tablet by mouth daily. 30 tablet 12   ?  terconazole (TERAZOL 7) 0.4 % vaginal cream Place 1 applicator vaginally at bedtime. Use for seven days (Patient not taking: Reported on 09/24/2021) 45 g 0   ? ?I have reviewed patient's Past Medical Hx, Surgical Hx, Family Hx, Social Hx, medications and allergies.  ? ?ROS:  ?Review of Systems  ?Constitutional:  Negative for fever.  ?HENT:  Negative for congestion and sore throat.   ?Eyes:  Negative for visual disturbance.  ?Gastrointestinal:  Positive for abdominal pain. Negative for nausea and vomiting.  ?Genitourinary:  Positive for difficulty urinating, dysuria, flank pain, frequency, hematuria and urgency. Negative for pelvic pain, vaginal bleeding and vaginal discharge.  ?Neurological:  Negative for dizziness, syncope, light-headedness and headaches.  ? ?Physical Exam  ?Patient Vitals for the past 24 hrs: ? BP Temp Temp src Pulse  Resp SpO2 Height Weight  ?10/09/21 0009 114/69 98.5 ?F (36.9 ?C) Oral 66 19 99 % -- --  ?10/08/21 2210 104/61 98.4 ?F (36.9 ?C) Oral 80 18 -- 5' (1.524 m) 128 lb (58.1 kg)  ? ?Physical Exam ?Vitals and nursing note reviewed.  ?Constitutional:   ?   General: She is in acute distress.  ?   Appearance: Normal appearance. She is normal weight. She is not ill-appearing or diaphoretic.  ?HENT:  ?   Head: Normocephalic and atraumatic.  ?Eyes:  ?   Pupils: Pupils are equal, round, and reactive to light.  ?Cardiovascular:  ?   Rate and Rhythm: Normal rate and regular rhythm.  ?Pulmonary:  ?   Effort: Pulmonary effort is normal.  ?Abdominal:  ?   Palpations: Abdomen is soft.  ?   Tenderness: There is right CVA tenderness.  ?Musculoskeletal:     ?   General: Normal range of motion.  ?Skin: ?   General: Skin is warm and dry.  ?   Capillary Refill: Capillary refill takes less than 2 seconds.  ?Neurological:  ?   Mental Status: She is alert and oriented to person, place, and time.  ?Psychiatric:     ?   Mood and Affect: Mood normal.     ?   Behavior: Behavior normal.     ?   Thought Content: Thought content normal.     ?   Judgment: Judgment normal.  ?  ?Fetal Tracing: reactive ?Baseline: 145 ?Variability: moderate for gestational age ?Accelerations: 10x10 ?Decelerations: none ?Toco: UI ?  ?Labs: ?Results for orders placed or performed during the hospital encounter of 10/08/21 (from the past 24 hour(s))  ?Urinalysis, Routine w reflex microscopic Urine, Clean Catch     Status: Abnormal  ? Collection Time: 10/08/21 10:32 PM  ?Result Value Ref Range  ? Color, Urine YELLOW YELLOW  ? APPearance CLOUDY (A) CLEAR  ? Specific Gravity, Urine 1.014 1.005 - 1.030  ? pH 7.0 5.0 - 8.0  ? Glucose, UA NEGATIVE NEGATIVE mg/dL  ? Hgb urine dipstick SMALL (A) NEGATIVE  ? Bilirubin Urine NEGATIVE NEGATIVE  ? Ketones, ur NEGATIVE NEGATIVE mg/dL  ? Protein, ur 30 (A) NEGATIVE mg/dL  ? Nitrite NEGATIVE NEGATIVE  ? Leukocytes,Ua LARGE (A) NEGATIVE  ?  RBC / HPF 6-10 0 - 5 RBC/hpf  ? WBC, UA >50 (H) 0 - 5 WBC/hpf  ? Bacteria, UA MANY (A) NONE SEEN  ? Squamous Epithelial / LPF 6-10 0 - 5  ? Non Squamous Epithelial 0-5 (A) NONE SEEN  ?CBC with Differential/Platelet     Status: Abnormal  ? Collection Time: 10/08/21 11:35 PM  ?Result Value Ref Range  ?  WBC 14.4 (H) 4.0 - 10.5 K/uL  ? RBC 3.92 3.87 - 5.11 MIL/uL  ? Hemoglobin 12.5 12.0 - 15.0 g/dL  ? HCT 37.2 36.0 - 46.0 %  ? MCV 94.9 80.0 - 100.0 fL  ? MCH 31.9 26.0 - 34.0 pg  ? MCHC 33.6 30.0 - 36.0 g/dL  ? RDW 13.1 11.5 - 15.5 %  ? Platelets 187 150 - 400 K/uL  ? nRBC 0.0 0.0 - 0.2 %  ? Neutrophils Relative % 80 %  ? Neutro Abs 11.6 (H) 1.7 - 7.7 K/uL  ? Lymphocytes Relative 10 %  ? Lymphs Abs 1.4 0.7 - 4.0 K/uL  ? Monocytes Relative 9 %  ? Monocytes Absolute 1.2 (H) 0.1 - 1.0 K/uL  ? Eosinophils Relative 0 %  ? Eosinophils Absolute 0.0 0.0 - 0.5 K/uL  ? Basophils Relative 0 %  ? Basophils Absolute 0.1 0.0 - 0.1 K/uL  ? Immature Granulocytes 1 %  ? Abs Immature Granulocytes 0.10 (H) 0.00 - 0.07 K/uL  ?Comprehensive metabolic panel     Status: Abnormal  ? Collection Time: 10/08/21 11:35 PM  ?Result Value Ref Range  ? Sodium 137 135 - 145 mmol/L  ? Potassium 3.9 3.5 - 5.1 mmol/L  ? Chloride 109 98 - 111 mmol/L  ? CO2 18 (L) 22 - 32 mmol/L  ? Glucose, Bld 97 70 - 99 mg/dL  ? BUN 11 6 - 20 mg/dL  ? Creatinine, Ser 0.83 0.44 - 1.00 mg/dL  ? Calcium 8.5 (L) 8.9 - 10.3 mg/dL  ? Total Protein 6.2 (L) 6.5 - 8.1 g/dL  ? Albumin 3.2 (L) 3.5 - 5.0 g/dL  ? AST 26 15 - 41 U/L  ? ALT 36 0 - 44 U/L  ? Alkaline Phosphatase 82 38 - 126 U/L  ? Total Bilirubin 0.4 0.3 - 1.2 mg/dL  ? GFR, Estimated >60 >60 mL/min  ? Anion gap 10 5 - 15  ? ?Imaging:  ?US RENAL ? ?Result Date: 10/09/2021 ?CLINICAL DATA:  Right flank pain EXAM: RENAL / URINARY TRACT ULTRASOUND COMPLETE COMPARISON:  CT 09/24/2013 FINDINGS: Right Kidney: Renal measurements: 10.6 x 5.6 x 5.5 cm = volume: 170 mL. Mild right hydronephrosis. Normal echotexture. No mass. Left  Kidney: Renal measurements: 10.4 x 4.6 x 5.3 cm = volume: 134 mL. Echogenicity within normal limits. No mass or hydronephrosis visualized. Bladder: Appears normal for degree of bladder distention. Other: None

## 2021-10-09 ENCOUNTER — Encounter (HOSPITAL_COMMUNITY): Payer: Self-pay | Admitting: Obstetrics and Gynecology

## 2021-10-09 LAB — COMPREHENSIVE METABOLIC PANEL
ALT: 36 U/L (ref 0–44)
AST: 26 U/L (ref 15–41)
Albumin: 3.2 g/dL — ABNORMAL LOW (ref 3.5–5.0)
Alkaline Phosphatase: 82 U/L (ref 38–126)
Anion gap: 10 (ref 5–15)
BUN: 11 mg/dL (ref 6–20)
CO2: 18 mmol/L — ABNORMAL LOW (ref 22–32)
Calcium: 8.5 mg/dL — ABNORMAL LOW (ref 8.9–10.3)
Chloride: 109 mmol/L (ref 98–111)
Creatinine, Ser: 0.83 mg/dL (ref 0.44–1.00)
GFR, Estimated: >60 mL/min (ref >60)
Glucose, Bld: 97 mg/dL (ref 70–99)
Potassium: 3.9 mmol/L (ref 3.5–5.1)
Sodium: 137 mmol/L (ref 135–145)
Total Bilirubin: 0.4 mg/dL (ref 0.3–1.2)
Total Protein: 6.2 g/dL — ABNORMAL LOW (ref 6.5–8.1)

## 2021-10-09 LAB — TYPE AND SCREEN
ABO/RH(D): O POS
Antibody Screen: NEGATIVE

## 2021-10-09 MED ORDER — OXYCODONE-ACETAMINOPHEN 5-325 MG PO TABS
1.0000 | ORAL_TABLET | Freq: Four times a day (QID) | ORAL | Status: DC | PRN
  Administered 2021-10-09 – 2021-10-10 (×4): 2 via ORAL
  Filled 2021-10-09 (×5): qty 2

## 2021-10-10 DIAGNOSIS — Z3A27 27 weeks gestation of pregnancy: Secondary | ICD-10-CM

## 2021-10-10 DIAGNOSIS — O2300 Infections of kidney in pregnancy, unspecified trimester: Secondary | ICD-10-CM | POA: Diagnosis not present

## 2021-10-10 MED ORDER — BISACODYL 5 MG PO TBEC
5.0000 mg | DELAYED_RELEASE_TABLET | Freq: Every day | ORAL | Status: DC | PRN
  Administered 2021-10-10: 5 mg via ORAL
  Filled 2021-10-10: qty 1

## 2021-10-10 NOTE — Progress Notes (Signed)
FACULTY PRACTICE ANTEPARTUM COMPREHENSIVE PROGRESS NOTE ? ?Molly Rose is a 21 y.o. G1P0 at [redacted]w[redacted]d who is admitted for pyelonephritis.  Estimated Date of Delivery: 01/03/22 ?Fetal presentation is unsure. ? ?Length of Stay:  2 Days. Admitted 10/08/2021 ? ?Subjective: ?Pt feels a little better today. Flank pain has improved but she notes continue suprapubic pain.  ? ?Patient reports good fetal movement.  She reports no uterine contractions, no bleeding and no loss of fluid per vagina. ? ?Vitals:  Blood pressure (!) 108/56, pulse 72, temperature 97.9 ?F (36.6 ?C), temperature source Oral, resp. rate 18, height 5' (1.524 m), weight 58.1 kg, last menstrual period 03/29/2021, SpO2 98 %. ?Physical Examination: ?CONSTITUTIONAL: Well-developed, well-nourished female in no acute distress.  ?NEUROLOGIC: Alert and oriented to person, place, and time. No cranial nerve deficit noted. ?PSYCHIATRIC: Normal mood and affect. Normal behavior. Normal judgment and thought content. ?CARDIOVASCULAR: Normal heart rate noted, regular rhythm ?RESPIRATORY: Effort and breath sounds normal, no problems with respiration noted ?MUSCULOSKELETAL: Normal range of motion. No edema and no tenderness. 2+ distal pulses. ?ABDOMEN: Soft, nontender, nondistended, gravid. ?CERVIX:  Not examined ? ?Fetal monitoring: FHR: 150 bpm, Variability: moderate, Accelerations: Present, Decelerations: Absent  ?Uterine activity: no contractions per hour ? ?Results for orders placed or performed during the hospital encounter of 10/08/21 (from the past 48 hour(s))  ?Urinalysis, Routine w reflex microscopic Urine, Clean Catch     Status: Abnormal  ? Collection Time: 10/08/21 10:32 PM  ?Result Value Ref Range  ? Color, Urine YELLOW YELLOW  ? APPearance CLOUDY (A) CLEAR  ? Specific Gravity, Urine 1.014 1.005 - 1.030  ? pH 7.0 5.0 - 8.0  ? Glucose, UA NEGATIVE NEGATIVE mg/dL  ? Hgb urine dipstick SMALL (A) NEGATIVE  ? Bilirubin Urine NEGATIVE NEGATIVE  ? Ketones, ur  NEGATIVE NEGATIVE mg/dL  ? Protein, ur 30 (A) NEGATIVE mg/dL  ? Nitrite NEGATIVE NEGATIVE  ? Leukocytes,Ua LARGE (A) NEGATIVE  ? RBC / HPF 6-10 0 - 5 RBC/hpf  ? WBC, UA >50 (H) 0 - 5 WBC/hpf  ? Bacteria, UA MANY (A) NONE SEEN  ? Squamous Epithelial / LPF 6-10 0 - 5  ? Non Squamous Epithelial 0-5 (A) NONE SEEN  ?  Comment: Performed at [facility], [place] [address], [place], [place] [address]  ?CBC with Differential/Platelet     Status: Abnormal  ? Collection Time: 10/08/21 11:35 PM  ?Result Value Ref Range  ? WBC 14.4 (H) 4.0 - 10.5 K/uL  ? RBC 3.92 3.87 - 5.11 MIL/uL  ? Hemoglobin 12.5 12.0 - 15.0 g/dL  ? HCT 37.2 36.0 - 46.0 %  ? MCV 94.9 80.0 - 100.0 fL  ? MCH 31.9 26.0 - 34.0 pg  ? MCHC 33.6 30.0 - 36.0 g/dL  ? RDW 13.1 11.5 - 15.5 %  ? Platelets 187 150 - 400 K/uL  ? nRBC 0.0 0.0 - 0.2 %  ? Neutrophils Relative % 80 %  ? Neutro Abs 11.6 (H) 1.7 - 7.7 K/uL  ? Lymphocytes Relative 10 %  ? Lymphs Abs 1.4 0.7 - 4.0 K/uL  ? Monocytes Relative 9 %  ? Monocytes Absolute 1.2 (H) 0.1 - 1.0 K/uL  ? Eosinophils Relative 0 %  ? Eosinophils Absolute 0.0 0.0 - 0.5 K/uL  ? Basophils Relative 0 %  ? Basophils Absolute 0.1 0.0 - 0.1 K/uL  ? Immature Granulocytes 1 %  ? Abs Immature Granulocytes 0.10 (H) 0.00 - 0.07 K/uL  ?  Comment: Performed  at [facility], [place] [address], [place], [place] [address]  ?Comprehensive metabolic panel     Status: Abnormal  ? Collection Time: 10/08/21 11:35 PM  ?Result Value Ref Range  ? Sodium 137 135 - 145 mmol/L  ? Potassium 3.9 3.5 - 5.1 mmol/L  ? Chloride 109 98 - 111 mmol/L  ? CO2 18 (L) 22 - 32 mmol/L  ? Glucose, Bld 97 70 - 99 mg/dL  ?  Comment: Glucose reference range applies only to samples taken after fasting for at least 8 hours.  ? BUN 11 6 - 20 mg/dL  ? Creatinine, Ser 0.83 0.44 - 1.00 mg/dL  ? Calcium 8.5 (L) 8.9 - 10.3 mg/dL  ? Total Protein 6.2 (L) 6.5 - 8.1 g/dL  ? Albumin 3.2 (L) 3.5 - 5.0 g/dL  ? AST 26 15 - 41 U/L  ? ALT 36 0 - 44 U/L  ? Alkaline Phosphatase 82 38 -  126 U/L  ? Total Bilirubin 0.4 0.3 - 1.2 mg/dL  ? GFR, Estimated >60 >60 mL/min  ?  Comment: (NOTE) ?Calculated using the CKD-EPI Creatinine Equation (2021) ?  ? Anion gap 10 5 - 15  ?  Comment: Performed at [facility], [place] [address], [place], [place] [address]  ?Type and screen [place]     Status: None  ? Collection Time: 10/09/21 12:45 AM  ?Result Value Ref Range  ? ABO/RH(D) O POS   ? Antibody Screen NEG   ? Sample Expiration    ?  10/12/2021,2359 ?Performed at [facility], [place] [address] [place], [place] [address] ?  ? ? ?US RENAL ? ?Result Date: 10/09/2021 ?CLINICAL DATA:  Right flank pain EXAM: RENAL / URINARY TRACT ULTRASOUND COMPLETE COMPARISON:  CT 09/24/2013 FINDINGS: Right Kidney: Renal measurements: 10.6 x 5.6 x 5.5 cm = volume: 170 mL. Mild right hydronephrosis. Normal echotexture. No mass. Left Kidney: Renal measurements: 10.4 x 4.6 x 5.3 cm = volume: 134 mL. Echogenicity within normal limits. No mass or hydronephrosis visualized. Bladder: Appears normal for degree of bladder distention. Other: None. IMPRESSION: Mild right hydronephrosis. Electronically Signed   By: Rolm Baptise M.D.   On: 10/09/2021 00:04   ? ?Current scheduled medications ? docusate sodium  100 mg Oral Daily  ? prenatal multivitamin  1 tablet Oral Q1200  ? ? ?I have reviewed the patient's current medications. ? ?ASSESSMENT: ?Principal Problem: ?  Pyelonephritis affecting pregnancy, antepartum ? ? ?PLAN: ?Afebrile overnight ?Would continue IV Aztreonam for another 24 hours and then anticipate d/c home tomorrow on PO meds ?Reviewed suppression following treatment until delivery ?Reflex urine culture note reported yet from 5/15 - would call lab today.  ? ? ?Continue routine antenatal care. ? ? ?Radene Gunning, MD, FACOG ?Obstetrician Social research officer, government, Faculty Practice ?[facility] ? ?

## 2021-10-11 ENCOUNTER — Other Ambulatory Visit (HOSPITAL_COMMUNITY): Payer: Self-pay

## 2021-10-11 DIAGNOSIS — O2342 Unspecified infection of urinary tract in pregnancy, second trimester: Secondary | ICD-10-CM

## 2021-10-11 MED ORDER — NITROFURANTOIN MONOHYD MACRO 100 MG PO CAPS
100.0000 mg | ORAL_CAPSULE | Freq: Every day | ORAL | 2 refills | Status: DC
  Filled 2021-10-11: qty 30, 30d supply, fill #0

## 2021-10-11 MED ORDER — CEPHALEXIN 500 MG PO CAPS
500.0000 mg | ORAL_CAPSULE | Freq: Four times a day (QID) | ORAL | 0 refills | Status: DC
  Filled 2021-10-11: qty 28, 7d supply, fill #0

## 2021-10-11 NOTE — Discharge Summary (Signed)
Physician Discharge Summary  Patient ID: Molly Rose MRN: [id number] DOB/AGE: Jun 15, 2000 21 y.o.  Admit date: 10/08/2021 Discharge date: 10/11/2021  Admission Diagnoses:pyelonephritis during pregnancy Recurrent UTI during pregnancy  Discharge Diagnoses:  Principal Problem:   Pyelonephritis affecting pregnancy, antepartum   Discharged Condition: good  Hospital Course: 3 documented UTIs during pregnancy Admitted with fever back pain leukocytosis  Consults: None  Significant Diagnostic Studies: labs:   Treatments: IV aztreonam  Discharge Exam: Blood pressure (!) 102/48, pulse 66, temperature 98.3 F (36.8 C), temperature source Oral, resp. rate 18, height 5' (1.524 m), weight 58.1 kg, last menstrual period 03/29/2021, SpO2 99 %. General appearance: alert, cooperative, and no distress Back is normal no CVAT  Disposition: Discharge disposition: 01-Home or Self Care       Discharge Instructions     Diet - low sodium heart healthy   Complete by: As directed    Increase activity slowly   Complete by: As directed         Follow-up Information     [facility] Follow up on 10/23/2021.   Specialty: Obstetrics and Gynecology Why: already scheduled Contact information: [address] [place] [address] [phone]                Signed: Lazaro Arms 10/11/2021, 11:02 AM

## 2021-10-12 ENCOUNTER — Institutional Professional Consult (permissible substitution): Payer: Medicaid Other | Admitting: Physician Assistant

## 2021-10-23 ENCOUNTER — Ambulatory Visit (INDEPENDENT_AMBULATORY_CARE_PROVIDER_SITE_OTHER): Payer: Medicaid Other | Admitting: Obstetrics

## 2021-10-23 ENCOUNTER — Other Ambulatory Visit: Payer: Medicaid Other

## 2021-10-23 ENCOUNTER — Encounter: Payer: Self-pay | Admitting: Obstetrics

## 2021-10-23 VITALS — BP 99/65 | HR 67 | Wt 130.6 lb

## 2021-10-23 DIAGNOSIS — Z3A29 29 weeks gestation of pregnancy: Secondary | ICD-10-CM

## 2021-10-23 DIAGNOSIS — Z3400 Encounter for supervision of normal first pregnancy, unspecified trimester: Secondary | ICD-10-CM

## 2021-10-23 DIAGNOSIS — O2342 Unspecified infection of urinary tract in pregnancy, second trimester: Secondary | ICD-10-CM

## 2021-10-23 NOTE — Progress Notes (Signed)
ROB 29.5 wks GTT, CBC, HIV, RPR today DTAP offered and deferred until next visit RH positive On UTI suppression

## 2021-10-23 NOTE — Progress Notes (Signed)
Subjective:  Molly Rose is a 21 y.o. G1P0 at [redacted]w[redacted]d being seen today for ongoing prenatal care.  She is currently monitored for the following issues for this low-risk pregnancy and has Migraine without aura and without status migrainosus, not intractable; Complicated migraine; Episodic tension-type headache; Supervision of normal first pregnancy, antepartum; Carrier of spinal muscular atrophy; UTI in pregnancy, second trimester; and Pyelonephritis affecting pregnancy, antepartum on their problem list.  Patient reports no complaints.  Contractions: Not present. Vag. Bleeding: None.  Movement: Present. Denies leaking of fluid.   The following portions of the patient's history were reviewed and updated as appropriate: allergies, current medications, past family history, past medical history, past social history, past surgical history and problem list. Problem list updated.  Objective:   Vitals:   10/23/21 0930  BP: 99/65  Pulse: 67  Weight: 130 lb 9.6 oz (59.2 kg)    Fetal Status: Fetal Heart Rate (bpm): 155   Movement: Present     General:  Alert, oriented and cooperative. Patient is in no acute distress.  Skin: Skin is warm and dry. No rash noted.   Cardiovascular: Normal heart rate noted  Respiratory: Normal respiratory effort, no problems with respiration noted  Abdomen: Soft, gravid, appropriate for gestational age. Pain/Pressure: Absent     Pelvic:  Cervical exam deferred        Extremities: Normal range of motion.  Edema: None  Mental Status: Normal mood and affect. Normal behavior. Normal judgment and thought content.   Urinalysis:      Assessment and Plan:  Pregnancy: G1P0 at [redacted]w[redacted]d  1. Supervision of normal first pregnancy, antepartum Rx: - Glucose Tolerance, 2 Hours w/1 Hour - CBC - RPR - HIV Antibody (routine testing w rflx)  2. UTI in pregnancy, second trimester - taking Keflex for suppression   Preterm labor symptoms and general obstetric precautions  including but not limited to vaginal bleeding, contractions, leaking of fluid and fetal movement were reviewed in detail with the patient. Please refer to After Visit Summary for other counseling recommendations.   Return in about 2 weeks (around 11/06/2021) for ROB.   Brock Bad, MD  10/23/21

## 2021-10-24 LAB — GLUCOSE TOLERANCE, 2 HOURS W/ 1HR
Glucose, 1 hour: 139 mg/dL (ref 70–179)
Glucose, 2 hour: 95 mg/dL (ref 70–152)
Glucose, Fasting: 71 mg/dL (ref 70–91)

## 2021-10-24 LAB — CBC
Hematocrit: 35.6 % (ref 34.0–46.6)
Hemoglobin: 12.3 g/dL (ref 11.1–15.9)
MCH: 32.2 pg (ref 26.6–33.0)
MCHC: 34.6 g/dL (ref 31.5–35.7)
MCV: 93 fL (ref 79–97)
Platelets: 246 10*3/uL (ref 150–450)
RBC: 3.82 x10E6/uL (ref 3.77–5.28)
RDW: 12.6 % (ref 11.7–15.4)
WBC: 8.7 10*3/uL (ref 3.4–10.8)

## 2021-10-24 LAB — RPR: RPR Ser Ql: NONREACTIVE

## 2021-10-24 LAB — HIV ANTIBODY (ROUTINE TESTING W REFLEX): HIV Screen 4th Generation wRfx: NONREACTIVE

## 2021-11-02 ENCOUNTER — Inpatient Hospital Stay (HOSPITAL_COMMUNITY): Payer: Medicaid Other

## 2021-11-02 ENCOUNTER — Encounter (HOSPITAL_COMMUNITY): Payer: Self-pay | Admitting: Obstetrics and Gynecology

## 2021-11-02 ENCOUNTER — Inpatient Hospital Stay (HOSPITAL_COMMUNITY)
Admission: AD | Admit: 2021-11-02 | Discharge: 2021-11-02 | Disposition: A | Discharge status: Home / Self Care | Payer: Medicaid Other | Attending: Obstetrics and Gynecology | Admitting: Obstetrics and Gynecology

## 2021-11-02 DIAGNOSIS — R1011 Right upper quadrant pain: Secondary | ICD-10-CM | POA: Insufficient documentation

## 2021-11-02 DIAGNOSIS — B3731 Acute candidiasis of vulva and vagina: Secondary | ICD-10-CM

## 2021-11-02 DIAGNOSIS — O99891 Other specified diseases and conditions complicating pregnancy: Secondary | ICD-10-CM | POA: Diagnosis not present

## 2021-11-02 DIAGNOSIS — O23593 Infection of other part of genital tract in pregnancy, third trimester: Secondary | ICD-10-CM | POA: Diagnosis not present

## 2021-11-02 DIAGNOSIS — O219 Vomiting of pregnancy, unspecified: Secondary | ICD-10-CM | POA: Diagnosis not present

## 2021-11-02 DIAGNOSIS — O98813 Other maternal infectious and parasitic diseases complicating pregnancy, third trimester: Secondary | ICD-10-CM | POA: Insufficient documentation

## 2021-11-02 DIAGNOSIS — O26893 Other specified pregnancy related conditions, third trimester: Secondary | ICD-10-CM | POA: Insufficient documentation

## 2021-11-02 DIAGNOSIS — R1010 Upper abdominal pain, unspecified: Secondary | ICD-10-CM

## 2021-11-02 DIAGNOSIS — Z3A31 31 weeks gestation of pregnancy: Secondary | ICD-10-CM | POA: Diagnosis not present

## 2021-11-02 LAB — URINALYSIS, ROUTINE W REFLEX MICROSCOPIC
Bilirubin Urine: NEGATIVE
Glucose, UA: NEGATIVE mg/dL
Hgb urine dipstick: NEGATIVE
Ketones, ur: 5 mg/dL — AB
Nitrite: NEGATIVE
Protein, ur: NEGATIVE mg/dL
Specific Gravity, Urine: 1.015 (ref 1.005–1.030)
pH: 6 (ref 5.0–8.0)

## 2021-11-02 LAB — CBC WITH DIFFERENTIAL/PLATELET
Abs Immature Granulocytes: 0.04 10*3/uL (ref 0.00–0.07)
Basophils Absolute: 0 10*3/uL (ref 0.0–0.1)
Basophils Relative: 0 %
Eosinophils Absolute: 0 10*3/uL (ref 0.0–0.5)
Eosinophils Relative: 1 %
HCT: 34.9 % — ABNORMAL LOW (ref 36.0–46.0)
Hemoglobin: 12 g/dL (ref 12.0–15.0)
Immature Granulocytes: 1 %
Lymphocytes Relative: 18 %
Lymphs Abs: 1.4 10*3/uL (ref 0.7–4.0)
MCH: 32.6 pg (ref 26.0–34.0)
MCHC: 34.4 g/dL (ref 30.0–36.0)
MCV: 94.8 fL (ref 80.0–100.0)
Monocytes Absolute: 0.6 10*3/uL (ref 0.1–1.0)
Monocytes Relative: 7 %
Neutro Abs: 6 10*3/uL (ref 1.7–7.7)
Neutrophils Relative %: 73 %
Platelets: 187 10*3/uL (ref 150–400)
RBC: 3.68 MIL/uL — ABNORMAL LOW (ref 3.87–5.11)
RDW: 13.2 % (ref 11.5–15.5)
WBC: 8.2 10*3/uL (ref 4.0–10.5)
nRBC: 0 % (ref 0.0–0.2)

## 2021-11-02 MED ORDER — TERCONAZOLE 0.4 % VA CREA
1.0000 | TOPICAL_CREAM | Freq: Every day | VAGINAL | 0 refills | Status: DC
Start: 2021-11-02 — End: 2021-11-23

## 2021-11-02 MED ORDER — ONDANSETRON 4 MG PO TBDP: 4.0000 mg | ORAL_TABLET | Freq: Three times a day (TID) | ORAL | 0 refills | Status: DC | PRN

## 2021-11-02 MED ORDER — FAMOTIDINE IN NACL 20-0.9 MG/50ML-% IV SOLN
20.0000 mg | Freq: Once | INTRAVENOUS | Status: AC
  Administered 2021-11-02: 20 mg via INTRAVENOUS
  Filled 2021-11-02: qty 50

## 2021-11-02 MED ORDER — LACTATED RINGERS IV BOLUS
1000.0000 mL | Freq: Once | INTRAVENOUS | Status: AC
  Administered 2021-11-02: 1000 mL via INTRAVENOUS

## 2021-11-02 MED ORDER — ONDANSETRON HCL 4 MG/2ML IJ SOLN
4.0000 mg | Freq: Once | INTRAMUSCULAR | Status: AC
  Administered 2021-11-02: 4 mg via INTRAVENOUS
  Filled 2021-11-02: qty 2

## 2021-11-02 MED ORDER — FAMOTIDINE 20 MG PO TABS: 20.0000 mg | ORAL_TABLET | Freq: Two times a day (BID) | ORAL | 1 refills | Status: DC

## 2021-11-02 NOTE — MAU Note (Signed)
Molly Rose is a 21 y.o. at [redacted]w[redacted]d here in MAU reporting: about an hour ago, started having pain across her stomach. Comes and goes, gets tight, feels like a really bad cramp.  Then she started throwing up.  Denies bleeding or LOF.  Baby is moving. Denies diarrhea, constipation or urinary problems  Onset of complaint: about an hour ago Pain score: 8 Vitals:   11/02/21 1404  BP: 105/61  Pulse: 82  Resp: 17  Temp: 97.8 F (36.6 C)  SpO2: 99%     FHT:134 Lab orders placed from triage:  urine

## 2021-11-02 NOTE — MAU Provider Note (Signed)
Chief Complaint: Abdominal Pain and Emesis  First Provider Initiated Contact with Patient 11/02/21 1515     SUBJECTIVE HPI: Molly Rose is a 21 y.o. G1P0 at 30w1dwho presents to Maternity Admissions reporting generalized abdominal pain and vomiting. She states that about 3 hours ago she began feeling abdominal pain and nausea, and shortly afterwards she vomited once. The patient ate [employer] for lunch. She has not been able to keep fluids down since this time, she drank some water while waiting to be seen and threw this up. Denies any nausea currently. Did have one episode of diarrhea two days ago. She does endorse some back pain as well. No VB, LOF, CTX.   Of note, she was treated for UTI last month with Keflex and was also placed on prophylactic Macrobid which she is still taking.    Past Medical History:  Diagnosis Date   Migraine    UTI (urinary tract infection)    OB History  Gravida Para Term Preterm AB Living  1         0  SAB IAB Ectopic Multiple Live Births               # Outcome Date GA Lbr Len/2nd Weight Sex Delivery Anes PTL Lv  1 Current            Past Surgical History:  Procedure Laterality Date   WISDOM TOOTH EXTRACTION     Social History   Socioeconomic History   Marital status: Significant Other    Spouse name: Not on file   Number of children: Not on file   Years of education: Not on file   Highest education level: Not on file  Occupational History   Not on file  Tobacco Use   Smoking status: Never   Smokeless tobacco: Never  Vaping Use   Vaping Use: Former  Substance and Sexual Activity   Alcohol use: No    Alcohol/week: 0.0 standard drinks of alcohol   Drug use: No   Sexual activity: Not Currently  Other Topics Concern   Not on file  Social History Narrative   [name] an 8th grader at [employer] She is doing very well. She lives with both parents and she has three siblings. She has one brother who is 238yo and two sisters  who are 881yo & 230yo. She enjoys soccer and volleyball.   Social Determinants of Health   Financial Resource Strain: Not on file  Food Insecurity: Not on file  Transportation Needs: Not on file  Physical Activity: Not on file  Stress: Not on file  Social Connections: Not on file  Intimate Partner Violence: Not on file   Family History  Problem Relation Age of Onset   Healthy Mother    Healthy Father    No current facility-administered medications on file prior to encounter.   Current Outpatient Medications on File Prior to Encounter  Medication Sig Dispense Refill   cephALEXin (KEFLEX) 500 MG capsule Take 1 capsule (500 mg total) by mouth 4 (four) times daily. 28 capsule 0   Prenatal Vit-Fe Fumarate-FA (PRENATAL VITAMIN) 27-0.8 MG TABS Take 1 tablet by mouth daily. 30 tablet 12   Blood Pressure Monitoring (BLOOD PRESSURE KIT) DEVI 1 kit by Does not apply route as needed. 1 each 0   dicyclomine (BENTYL) 20 MG tablet Take 1 tablet (20 mg total) by mouth 2 (two) times daily for 7 days. 14 tablet 0  EPINEPHrine 0.3 mg/0.3 mL IJ SOAJ injection Inject 0.3 mg into the muscle as needed for anaphylaxis. (Patient not taking: Reported on 09/24/2021) 1 each 1   Misc. Devices (GOJJI WEIGHT SCALE) MISC 1 Device by Does not apply route as needed. 1 each 0   nitrofurantoin, macrocrystal-monohydrate, (MACROBID) 100 MG capsule Take 1 capsule (100 mg total) by mouth at bedtime. 30 capsule 2   Allergies  Allergen Reactions   Rocephin [Ceftriaxone] Anaphylaxis    See MAU note from 07/25/2021. Hives on face, anaphylaxis reaction with drop in O2 saturation.    Other     Seasonal Allergies    I have reviewed patient's Past Medical Hx, Surgical Hx, Family Hx, Social Hx, medications and allergies.   Review of Systems  Constitutional:  Negative for chills and fever.  Gastrointestinal:  Positive for abdominal pain, nausea and vomiting. Negative for constipation and diarrhea.  Genitourinary:  Negative for  dysuria, frequency and urgency.  Musculoskeletal:  Positive for back pain.    OBJECTIVE Patient Vitals for the past 24 hrs:  BP Temp Temp src Pulse Resp SpO2 Height Weight  11/02/21 1437 103/62 -- -- 84 -- -- -- --  11/02/21 1404 105/61 97.8 F (36.6 C) Oral 82 17 99 % 5' (1.524 m) 59.5 kg   Constitutional: Well-developed, well-nourished female in no acute distress.  Cardiovascular: normal rate Respiratory: CTAB, no adventitious lung sounds  GI: Abd soft, mild RUQ tenderness, gravid appropriate for gestational age. Pos BS x 4 MS: Extremities nontender, no edema, normal ROM Neurologic: Alert and oriented x 4.  GU: Positive L CVAT.   LAB RESULTS Results for orders placed or performed during the hospital encounter of 11/02/21 (from the past 24 hour(s))  Urinalysis, Routine w reflex microscopic Urine, Clean Catch     Status: Abnormal   Collection Time: 11/02/21  2:17 PM  Result Value Ref Range   Color, Urine YELLOW YELLOW   APPearance HAZY (A) CLEAR   Specific Gravity, Urine 1.015 1.005 - 1.030   pH 6.0 5.0 - 8.0   Glucose, UA NEGATIVE NEGATIVE mg/dL   Hgb urine dipstick NEGATIVE NEGATIVE   Bilirubin Urine NEGATIVE NEGATIVE   Ketones, ur 5 (A) NEGATIVE mg/dL   Protein, ur NEGATIVE NEGATIVE mg/dL   Nitrite NEGATIVE NEGATIVE   Leukocytes,Ua SMALL (A) NEGATIVE   RBC / HPF 0-5 0 - 5 RBC/hpf   WBC, UA 0-5 0 - 5 WBC/hpf   Bacteria, UA RARE (A) NONE SEEN   Squamous Epithelial / LPF 6-10 0 - 5   Mucus PRESENT    Budding Yeast PRESENT   CBC with Differential/Platelet     Status: Abnormal   Collection Time: 11/02/21  5:40 PM  Result Value Ref Range   WBC 8.2 4.0 - 10.5 K/uL   RBC 3.68 (L) 3.87 - 5.11 MIL/uL   Hemoglobin 12.0 12.0 - 15.0 g/dL   HCT 34.9 (L) 36.0 - 46.0 %   MCV 94.8 80.0 - 100.0 fL   MCH 32.6 26.0 - 34.0 pg   MCHC 34.4 30.0 - 36.0 g/dL   RDW 13.2 11.5 - 15.5 %   Platelets 187 150 - 400 K/uL   nRBC 0.0 0.0 - 0.2 %   Neutrophils Relative % 73 %   Neutro Abs 6.0  1.7 - 7.7 K/uL   Lymphocytes Relative 18 %   Lymphs Abs 1.4 0.7 - 4.0 K/uL   Monocytes Relative 7 %   Monocytes Absolute 0.6 0.1 - 1.0 K/uL   Eosinophils  Relative 1 %   Eosinophils Absolute 0.0 0.0 - 0.5 K/uL   Basophils Relative 0 %   Basophils Absolute 0.0 0.0 - 0.1 K/uL   Immature Granulocytes 1 %   Abs Immature Granulocytes 0.04 0.00 - 0.07 K/uL    IMAGING US ABDOMEN LIMITED RUQ (LIVER/GB)  Result Date: 11/02/2021 CLINICAL DATA:  [id number].  Right upper quadrant pain EXAM: ULTRASOUND ABDOMEN LIMITED RIGHT UPPER QUADRANT COMPARISON:  Ultrasound renal 10/08/2021 FINDINGS: Gallbladder: Contracted gallbladder. No gallstones. Nonspecific gallbladder wall thickening visualized. No pericholecystic fluid. No sonographic Murphy sign noted by sonographer. Common bile duct: Diameter: 3 mm. Liver: No focal lesion identified. Within normal limits in parenchymal echogenicity. Portal vein is patent on color Doppler imaging with normal direction of blood flow towards the liver. Other: Persistent mild right hydronephrosis. IMPRESSION: 1. Persistent mild right hydronephrosis. 2. Contracted gallbladder. Electronically Signed   By: Iven Finn M.D.   On: 11/02/2021 18:36   US RENAL  Result Date: 10/09/2021 CLINICAL DATA:  Right flank pain EXAM: RENAL / URINARY TRACT ULTRASOUND COMPLETE COMPARISON:  CT 09/24/2013 FINDINGS: Right Kidney: Renal measurements: 10.6 x 5.6 x 5.5 cm = volume: 170 mL. Mild right hydronephrosis. Normal echotexture. No mass. Left Kidney: Renal measurements: 10.4 x 4.6 x 5.3 cm = volume: 134 mL. Echogenicity within normal limits. No mass or hydronephrosis visualized. Bladder: Appears normal for degree of bladder distention. Other: None. IMPRESSION: Mild right hydronephrosis. Electronically Signed   By: Rolm Baptise M.D.   On: 10/09/2021 00:04    MAU COURSE Orders Placed This Encounter  Procedures   Culture, OB Urine   US ABDOMEN LIMITED RUQ (LIVER/GB)   Urinalysis, Routine w reflex  microscopic Urine, Clean Catch   CBC with Differential/Platelet   Discharge patient Discharge disposition: 01-Home or Self Care; Discharge patient date: 11/02/2021   Discharge patient Discharge disposition: 01-Home or Self Care; Discharge patient date: 11/02/2021   Meds ordered this encounter  Medications   lactated ringers bolus 1,000 mL   famotidine (PEPCID) IVPB 20 mg premix   ondansetron (ZOFRAN) injection 4 mg   ondansetron (ZOFRAN-ODT) 4 MG disintegrating tablet    Sig: Take 1 tablet (4 mg total) by mouth every 8 (eight) hours as needed for nausea or vomiting.    Dispense:  30 tablet    Refill:  0    Order Specific Question:   Supervising Provider    Answer:   Lynnda Shields A [[id number]]   terconazole (TERAZOL 7) 0.4 % vaginal cream    Sig: Place 1 applicator vaginally at bedtime.    Dispense:  45 g    Refill:  0    Order Specific Question:   Supervising Provider    Answer:   Lynnda Shields A [[id number]]   famotidine (PEPCID) 20 MG tablet    Sig: Take 1 tablet (20 mg total) by mouth 2 (two) times daily.    Dispense:  60 tablet    Refill:  1    Order Specific Question:   Supervising Provider    Answer:   Griffin Basil [id number]    MDM Patient presented to MAU for abdominal pain and emesis. She reports two episodes of emesis today and generalized abdominal pain in addition to back pain. No evidence of early labor. UA without evidence of UTI, maintaining on Macrobid. Pt rehydrated with a bolus of LR, and nausea treated with IV Zofran and Pepsid. After treatment patient stated she felt better, however she tried eating some crackers and her abdominal pain  returned. Patient has mild RUQ tenderness on exam and given this and her symptoms, [place] was ordered which demonstrated mild right hydronephrosis and a contracted gallbladder. CBC unremarkable. Given her workup has been negative thus far, she is afebrile, and her symptoms have improved, will discharge her home.   ASSESSMENT 1. Nausea and  vomiting in pregnancy   2. Upper abdominal pain   3. [redacted] weeks gestation of pregnancy   4. Vaginal yeast infection     PLAN Discharge home in stable condition. BRAT diet x24 hours.  Patient informed of strict return precautions including CTX, VB, LOF, fever, persistent nausea/vomiting.    Follow-up Information     [facility] Follow up.   Specialty: Obstetrics and Gynecology Why: As needed, If symptoms worsen Contact information: [address] [id number] [place] [phone]               Allergies as of 11/02/2021       Reactions   Rocephin [ceftriaxone] Anaphylaxis   See MAU note from 07/25/2021. Hives on face, anaphylaxis reaction with drop in O2 saturation.    Other    Seasonal Allergies        Medication List     STOP taking these medications    cephALEXin 500 MG capsule Commonly known as: Keflex   nitrofurantoin (macrocrystal-monohydrate) 100 MG capsule Commonly known as: MACROBID       TAKE these medications    Blood Pressure Kit Devi 1 kit by Does not apply route as needed.   dicyclomine 20 MG tablet Commonly known as: BENTYL Take 1 tablet (20 mg total) by mouth 2 (two) times daily for 7 days.   EPINEPHrine 0.3 mg/0.3 mL Soaj injection Commonly known as: EPI-PEN Inject 0.3 mg into the muscle as needed for anaphylaxis.   famotidine 20 MG tablet Commonly known as: PEPCID Take 1 tablet (20 mg total) by mouth 2 (two) times daily.   Gojji Weight Scale Misc 1 Device by Does not apply route as needed.   ondansetron 4 MG disintegrating tablet Commonly known as: ZOFRAN-ODT Take 1 tablet (4 mg total) by mouth every 8 (eight) hours as needed for nausea or vomiting.   Prenatal Vitamin 27-0.8 MG Tabs Take 1 tablet by mouth daily.   terconazole 0.4 % vaginal cream Commonly known as: TERAZOL 7 Place 1 applicator vaginally at bedtime.       [place], [place] 11/02/2021 7:04  PM

## 2021-11-04 NOTE — MAU Provider Note (Signed)
History     CSN: [id number]  Arrival date and time: 11/02/21 1341   Event Date/Time   First Provider Initiated Contact with Patient 11/02/21 1515      Chief Complaint  Patient presents with   Abdominal Pain   Emesis   HPI  SUBJECTIVE HPI: Molly Rose is a 21 y.o. G1P0 at [redacted]w[redacted]d who presents to Maternity Admissions reporting generalized abdominal pain and vomiting. She states that about 3 hours ago she began feeling abdominal pain and nausea, and shortly afterwards she vomited once. The patient ate [employer] for lunch; she had around 10 nuggets.  She has not been able to keep fluids down since this time, she drank some water while waiting to be seen and threw this up. Denies any nausea currently. Did have one episode of diarrhea two days ago. She does endorse some back pain as well. No VB, LOF, CTX.    Of note, she was treated for UTI last month with Keflex and was also placed on prophylactic Macrobid which she is still taking daily.   OB History     Gravida  1   Para      Term      Preterm      AB      Living  0      SAB      IAB      Ectopic      Multiple      Live Births              Past Medical History:  Diagnosis Date   Migraine    UTI (urinary tract infection)     Past Surgical History:  Procedure Laterality Date   WISDOM TOOTH EXTRACTION      Family History  Problem Relation Age of Onset   Healthy Mother    Healthy Father     Social History   Tobacco Use   Smoking status: Never   Smokeless tobacco: Never  Vaping Use   Vaping Use: Former  Substance Use Topics   Alcohol use: No    Alcohol/week: 0.0 standard drinks of alcohol   Drug use: No    Allergies:  Allergies  Allergen Reactions   Rocephin [Ceftriaxone] Anaphylaxis    See MAU note from 07/25/2021. Hives on face, anaphylaxis reaction with drop in O2 saturation.    Other     Seasonal Allergies    No medications prior to admission.   Results for orders placed or  performed during the hospital encounter of 11/02/21 (from the past 48 hour(s))  Urinalysis, Routine w reflex microscopic Urine, Clean Catch     Status: Abnormal   Collection Time: 11/02/21  2:17 PM  Result Value Ref Range   Color, Urine YELLOW YELLOW   APPearance HAZY (A) CLEAR   Specific Gravity, Urine 1.015 1.005 - 1.030   pH 6.0 5.0 - 8.0   Glucose, UA NEGATIVE NEGATIVE mg/dL   Hgb urine dipstick NEGATIVE NEGATIVE   Bilirubin Urine NEGATIVE NEGATIVE   Ketones, ur 5 (A) NEGATIVE mg/dL   Protein, ur NEGATIVE NEGATIVE mg/dL   Nitrite NEGATIVE NEGATIVE   Leukocytes,Ua SMALL (A) NEGATIVE   RBC / HPF 0-5 0 - 5 RBC/hpf   WBC, UA 0-5 0 - 5 WBC/hpf   Bacteria, UA RARE (A) NONE SEEN   Squamous Epithelial / LPF 6-10 0 - 5   Mucus PRESENT    Budding Yeast PRESENT     Comment: Performed at [facility]  [facility], [address], [place], [place] [address]  CBC with Differential/Platelet     Status: Abnormal   Collection Time: 11/02/21  5:40 PM  Result Value Ref Range   WBC 8.2 4.0 - 10.5 K/uL   RBC 3.68 (L) 3.87 - 5.11 MIL/uL   Hemoglobin 12.0 12.0 - 15.0 g/dL   HCT 34.9 (L) 36.0 - 46.0 %   MCV 94.8 80.0 - 100.0 fL   MCH 32.6 26.0 - 34.0 pg   MCHC 34.4 30.0 - 36.0 g/dL   RDW 13.2 11.5 - 15.5 %   Platelets 187 150 - 400 K/uL    Comment: REPEATED TO VERIFY   nRBC 0.0 0.0 - 0.2 %   Neutrophils Relative % 73 %   Neutro Abs 6.0 1.7 - 7.7 K/uL   Lymphocytes Relative 18 %   Lymphs Abs 1.4 0.7 - 4.0 K/uL   Monocytes Relative 7 %   Monocytes Absolute 0.6 0.1 - 1.0 K/uL   Eosinophils Relative 1 %   Eosinophils Absolute 0.0 0.0 - 0.5 K/uL   Basophils Relative 0 %   Basophils Absolute 0.0 0.0 - 0.1 K/uL   Immature Granulocytes 1 %   Abs Immature Granulocytes 0.04 0.00 - 0.07 K/uL    Comment: Performed at [facility] [address] [place], [place] [address]     Review of Systems  Constitutional:  Negative for fever.  Gastrointestinal:  Positive for abdominal pain, nausea and  vomiting.   Physical Exam   Blood pressure 106/61, pulse 71, temperature 97.8 F (36.6 C), temperature source Oral, resp. rate 17, height 5' (1.524 m), weight 59.5 kg, last menstrual period 03/29/2021, SpO2 99 %.  Physical Exam Constitutional:      General: She is not in acute distress.    Appearance: She is well-developed. She is not ill-appearing or toxic-appearing.  Abdominal:     Tenderness: There is generalized abdominal tenderness.  Skin:    General: Skin is warm.  Neurological:     Mental Status: She is alert and oriented to person, place, and time.    Fetal Tracing: Baseline: 120 bpm Variability: Moderate  Accelerations: 15x15 Decelerations:None Toco:  None  MAU Course  Procedures None  MDM  Urine culture pending LR bolus. Zofran and pepcid given. Patient felt better after fluids and medications.  CBC stable, she is afebrile. Symptoms improved.    Assessment and Plan   A:  1. Nausea and vomiting in pregnancy   2. Upper abdominal pain   3. [redacted] weeks gestation of pregnancy   4. Vaginal yeast infection     P:  Dc home RX: pepcid, zofran, terazol Brat diet Return to MAU if symptoms worsen Increase oral fluid intake   Judea Riches, Artist Pais, NP 11/04/2021 1:23 PM

## 2021-11-06 ENCOUNTER — Ambulatory Visit (INDEPENDENT_AMBULATORY_CARE_PROVIDER_SITE_OTHER): Payer: Medicaid Other | Admitting: Obstetrics & Gynecology

## 2021-11-06 ENCOUNTER — Encounter: Payer: Self-pay | Admitting: Obstetrics & Gynecology

## 2021-11-06 VITALS — BP 100/65 | HR 85 | Wt 132.0 lb

## 2021-11-06 DIAGNOSIS — Z3400 Encounter for supervision of normal first pregnancy, unspecified trimester: Secondary | ICD-10-CM | POA: Diagnosis not present

## 2021-11-06 DIAGNOSIS — O2300 Infections of kidney in pregnancy, unspecified trimester: Secondary | ICD-10-CM

## 2021-11-06 DIAGNOSIS — Z23 Encounter for immunization: Secondary | ICD-10-CM

## 2021-11-06 DIAGNOSIS — O2342 Unspecified infection of urinary tract in pregnancy, second trimester: Secondary | ICD-10-CM

## 2021-11-06 LAB — OB RESULTS CONSOLE RUBELLA ANTIBODY, IGM: Rubella: IMMUNE

## 2021-11-06 NOTE — Progress Notes (Signed)
   PRENATAL VISIT NOTE  Subjective:  Molly Rose is a 21 y.o. G1P0 at [redacted]w[redacted]d being seen today for ongoing prenatal care.  She is currently monitored for the following issues for this high-risk pregnancy and has Migraine without aura and without status migrainosus, not intractable; Complicated migraine; Episodic tension-type headache; Supervision of normal first pregnancy, antepartum; Carrier of spinal muscular atrophy; UTI in pregnancy, second trimester; and Pyelonephritis affecting pregnancy, antepartum on their problem list.  Patient reports no complaints.  Contractions: Not present. Vag. Bleeding: None.  Movement: Present. Denies leaking of fluid.   The following portions of the patient's history were reviewed and updated as appropriate: allergies, current medications, past family history, past medical history, past social history, past surgical history and problem list.   Objective:   Vitals:   11/06/21 1617  BP: 100/65  Pulse: 85  Weight: 132 lb (59.9 kg)    Fetal Status: Fetal Heart Rate (bpm): 150   Movement: Present     General:  Alert, oriented and cooperative. Patient is in no acute distress.  Skin: Skin is warm and dry. No rash noted.   Cardiovascular: Normal heart rate noted  Respiratory: Normal respiratory effort, no problems with respiration noted  Abdomen: Soft, gravid, appropriate for gestational age.  Pain/Pressure: Present     Pelvic: Cervical exam deferred        Extremities: Normal range of motion.     Mental Status: Normal mood and affect. Normal behavior. Normal judgment and thought content.   Assessment and Plan:  Pregnancy: G1P0 at 102w5d 1. Supervision of normal first pregnancy, antepartum Tdap due, needs rubella titer  2. Pyelonephritis affecting pregnancy, antepartum Recommend abx suppression  3. UTI in pregnancy, second trimester Third trimester. Resume ab suppression with Macrobid  Preterm labor symptoms and general obstetric precautions  including but not limited to vaginal bleeding, contractions, leaking of fluid and fetal movement were reviewed in detail with the patient. Please refer to After Visit Summary for other counseling recommendations.   Return in about 2 weeks (around 11/20/2021).  No future appointments.  Emeterio Reeve, MD

## 2021-11-07 LAB — RUBELLA SCREEN: Rubella Antibodies, IGG: 1.2 index (ref >0.99)

## 2021-11-12 ENCOUNTER — Telehealth: Payer: Medicaid Other | Admitting: Family Medicine

## 2021-11-12 ENCOUNTER — Telehealth: Payer: Self-pay

## 2021-11-12 DIAGNOSIS — M545 Low back pain, unspecified: Secondary | ICD-10-CM

## 2021-11-12 NOTE — Progress Notes (Signed)
Molly Rose   Needs to have urine sample given the on going need for Macrobid- she had stopped it for a while -several days due to n/v. But now is reporting UTI like symptoms and back pain. Given the Pyelonephritis affecting pregnancy she is advised to contact her OBGYN to see if she can be seen today vs going to MAU to make sure she receives the appropriate and most timely treatment needed.   Patient acknowledged agreement and understanding of the plan.

## 2021-11-12 NOTE — Telephone Encounter (Signed)
Patient called and left message on triage vm complaining of lower back pain on her right side and increased vaginal pressure. Patient advised to purchase a maternity support belt.   She states that she stopped taking her prophylactic antibiotic for UTI suppression about two weeks ago,  Denies having UTI sx at this time, but with her history of recurrent UTI's patient would like to come in to have her urine checked.  Patient schedule for nurse visit.

## 2021-11-14 ENCOUNTER — Ambulatory Visit: Payer: Medicaid Other

## 2021-11-20 ENCOUNTER — Telehealth: Payer: Medicaid Other | Admitting: Emergency Medicine

## 2021-11-20 ENCOUNTER — Telehealth: Payer: Medicaid Other | Admitting: Physician Assistant

## 2021-11-20 DIAGNOSIS — N898 Other specified noninflammatory disorders of vagina: Secondary | ICD-10-CM

## 2021-11-20 DIAGNOSIS — R197 Diarrhea, unspecified: Secondary | ICD-10-CM

## 2021-11-20 DIAGNOSIS — R1110 Vomiting, unspecified: Secondary | ICD-10-CM

## 2021-11-22 ENCOUNTER — Ambulatory Visit (INDEPENDENT_AMBULATORY_CARE_PROVIDER_SITE_OTHER): Payer: Medicaid Other | Admitting: Family Medicine

## 2021-11-22 VITALS — BP 100/59 | HR 75 | Wt 134.5 lb

## 2021-11-22 DIAGNOSIS — Z3A34 34 weeks gestation of pregnancy: Secondary | ICD-10-CM

## 2021-11-22 DIAGNOSIS — N390 Urinary tract infection, site not specified: Secondary | ICD-10-CM

## 2021-11-22 DIAGNOSIS — Z3400 Encounter for supervision of normal first pregnancy, unspecified trimester: Secondary | ICD-10-CM

## 2021-11-22 DIAGNOSIS — O26843 Uterine size-date discrepancy, third trimester: Secondary | ICD-10-CM

## 2021-11-22 NOTE — Progress Notes (Signed)
Pt presents for ROB today.  No concerns.

## 2021-11-22 NOTE — Progress Notes (Signed)
    Subjective:  Molly Rose is a 21 y.o. G1P0 at [redacted]w[redacted]d being seen today for ongoing prenatal care.  She is currently monitored for the following issues for this low-risk pregnancy and has Migraine without aura and without status migrainosus, not intractable; Complicated migraine; Episodic tension-type headache; Supervision of normal first pregnancy, antepartum; Carrier of spinal muscular atrophy; UTI in pregnancy, second trimester; and Pyelonephritis affecting pregnancy, antepartum on their problem list.  Patient reports doing well. States Monday and Tuesday she had a few episodes of vomiting and diarrhea. Hasn't happened yet today. Not sure if that was a normal part of pregnancy.  Contractions: Not present. Vag. Bleeding: None.  Movement: Present. Denies leaking of fluid.   The following portions of the patient's history were reviewed and updated as appropriate: allergies, current medications, past family history, past medical history, past social history, past surgical history and problem list.   Objective:   Vitals:   11/22/21 1504  BP: (!) 100/59  Pulse: 75  Weight: 134 lb 8 oz (61 kg)    Fetal Status: Fetal Heart Rate (bpm): 144 Fundal Height: 31 cm Movement: Present     General:  Alert, oriented and cooperative. Patient is in no acute distress.  Skin: Skin is warm and dry. No rash noted.   Cardiovascular: Normal heart rate noted  Respiratory: Normal respiratory effort, no problems with respiration noted  Abdomen: Soft, gravid, appropriate for gestational age. Pain/Pressure: Present     Pelvic:  Cervical exam deferred        Extremities: Normal range of motion.  Edema: None  Mental Status: Normal mood and affect. Normal behavior. Normal judgment and thought content.    Assessment and Plan:  Pregnancy: G1P0 at [redacted]w[redacted]d  1. Supervision of normal first pregnancy, antepartum Doing well. Normal fetal movement.   2. [redacted] weeks gestation of pregnancy Aware of gc/ch/GBS next  visit.  3. Uterine size date discrepancy pregnancy, third trimester ~ 31 cm now at 34 weeks (last documented fundal height was at 25 weeks and 24 cm at that time). Patient is also 5'. Last [place] was anatomy scan.  - [place] MFM OB FOLLOW UP; Future  4. Recurrent UTI On macrobid suppression, restarted last visit. No symptoms currently. Recheck urine culture next visit.    5. Recent N/V/D Sounds more suspicious for acute GI illness at the time. Has zofran at home. Encouraged plenty of fluids. Instructed to let us know if symptoms return/persistent.   Preterm labor symptoms and general obstetric precautions including but not limited to vaginal bleeding, contractions, leaking of fluid and fetal movement were reviewed in detail with the patient. Please refer to After Visit Summary for other counseling recommendations.   Return for LROB with GBS.   Allayne Stack, DO

## 2021-12-05 ENCOUNTER — Encounter (HOSPITAL_COMMUNITY): Payer: Self-pay | Admitting: Obstetrics & Gynecology

## 2021-12-05 ENCOUNTER — Other Ambulatory Visit: Payer: Self-pay

## 2021-12-05 ENCOUNTER — Inpatient Hospital Stay (HOSPITAL_COMMUNITY)
Admission: AD | Admit: 2021-12-05 | Discharge: 2021-12-06 | Disposition: A | Discharge status: Home / Self Care | Payer: Medicaid Other | Attending: Obstetrics & Gynecology | Admitting: Obstetrics & Gynecology

## 2021-12-05 DIAGNOSIS — O4703 False labor before 37 completed weeks of gestation, third trimester: Secondary | ICD-10-CM | POA: Diagnosis not present

## 2021-12-05 DIAGNOSIS — Z3A36 36 weeks gestation of pregnancy: Secondary | ICD-10-CM | POA: Insufficient documentation

## 2021-12-05 DIAGNOSIS — O2342 Unspecified infection of urinary tract in pregnancy, second trimester: Secondary | ICD-10-CM

## 2021-12-05 DIAGNOSIS — R109 Unspecified abdominal pain: Secondary | ICD-10-CM | POA: Insufficient documentation

## 2021-12-05 DIAGNOSIS — O26893 Other specified pregnancy related conditions, third trimester: Secondary | ICD-10-CM | POA: Insufficient documentation

## 2021-12-05 LAB — POCT FERN TEST: POCT Fern Test: NEGATIVE

## 2021-12-05 LAB — WET PREP, GENITAL
Clue Cells Wet Prep HPF POC: NONE SEEN
Sperm: NONE SEEN
Trich, Wet Prep: NONE SEEN
WBC, Wet Prep HPF POC: >=10 — AB (ref <10)
Yeast Wet Prep HPF POC: NONE SEEN

## 2021-12-05 LAB — URINALYSIS, ROUTINE W REFLEX MICROSCOPIC
Bacteria, UA: NONE SEEN
Bilirubin Urine: NEGATIVE
Glucose, UA: NEGATIVE mg/dL
Hgb urine dipstick: NEGATIVE
Ketones, ur: NEGATIVE mg/dL
Nitrite: NEGATIVE
Protein, ur: 30 mg/dL — AB
Specific Gravity, Urine: 1.019 (ref 1.005–1.030)
pH: 6 (ref 5.0–8.0)

## 2021-12-05 MED ORDER — CYCLOBENZAPRINE HCL 5 MG PO TABS
10.0000 mg | ORAL_TABLET | Freq: Once | ORAL | Status: AC
  Administered 2021-12-05: 10 mg via ORAL
  Filled 2021-12-05: qty 2

## 2021-12-05 NOTE — MAU Note (Signed)
.  Molly Rose is a 21 y.o. at [redacted]w[redacted]d here in MAU reporting:  for the past 2-3 days has been feeling  abd pain and cramping in her lower abd and upper abd. Pain is about 1-2 times an hour. Good fetal movement reported. Pt also c/o that she feels some leaking of fluid   a few moments  after she urinates. She is on prophylactic Macrobid for recurrent UTI's. Stated she had some pain and discomfort with urination  a few days ago but not now.   Onset of complaint: today Pain score: 7 Vitals:   12/05/21 2115  BP: 118/70  Pulse: 70  Resp: 18  Temp: 98.2 F (36.8 C)     FHT:140 Lab orders placed from triage:  U/A

## 2021-12-05 NOTE — MAU Provider Note (Addendum)
History     CSN: [id number]  Arrival date and time: 12/05/21 2041   Event Date/Time   First Provider Initiated Contact with Patient 12/05/21 2119      Chief Complaint  Patient presents with   Vaginal Discharge   Abdominal Pain   Molly Rose, a  21 y.o. G1P0 at 20w6dpresents to MAU with complaints of intermittent abdominal cramping that started since Monday. Currently rates pain 7/10. Denies taking anything for improvement. She does endorse that Movement makes it better. She also complains of LOF, started today after urination. She states No odor, no color.  She denies wearing a pad, and denies continous leaking. No painful urinary symptoms. Denies vaginal bleeding, abnormal vaginal discharge. Endorses +FM.     OB History     Gravida  1   Para      Term      Preterm      AB      Living  0      SAB      IAB      Ectopic      Multiple      Live Births              Past Medical History:  Diagnosis Date   Migraine    UTI (urinary tract infection)     Past Surgical History:  Procedure Laterality Date   WISDOM TOOTH EXTRACTION      Family History  Problem Relation Age of Onset   Healthy Mother    Healthy Father     Social History   Tobacco Use   Smoking status: Never   Smokeless tobacco: Never  Vaping Use   Vaping Use: Former  Substance Use Topics   Alcohol use: No    Alcohol/week: 0.0 standard drinks of alcohol   Drug use: No    Allergies:  Allergies  Allergen Reactions   Rocephin [Ceftriaxone] Anaphylaxis    See MAU note from 07/25/2021. Hives on face, anaphylaxis reaction with drop in O2 saturation.    Other     Seasonal Allergies    Medications Prior to Admission  Medication Sig Dispense Refill Last Dose   famotidine (PEPCID) 20 MG tablet Take 1 tablet (20 mg total) by mouth 2 (two) times daily. 60 tablet 1 Past Month   nitrofurantoin, macrocrystal-monohydrate, (MACROBID) 100 MG capsule Take 100 mg by mouth daily.       Prenatal Vit-Fe Fumarate-FA (PRENATAL VITAMIN) 27-0.8 MG TABS Take 1 tablet by mouth daily. 30 tablet 12 12/05/2021   Blood Pressure Monitoring (BLOOD PRESSURE KIT) DEVI 1 kit by Does not apply route as needed. 1 each 0    EPINEPHrine 0.3 mg/0.3 mL IJ SOAJ injection Inject 0.3 mg into the muscle as needed for anaphylaxis. (Patient not taking: Reported on 09/24/2021) 1 each 1    Misc. Devices (GOJJI WEIGHT SCALE) MISC 1 Device by Does not apply route as needed. 1 each 0    ondansetron (ZOFRAN-ODT) 4 MG disintegrating tablet Take 1 tablet (4 mg total) by mouth every 8 (eight) hours as needed for nausea or vomiting. 30 tablet 0 More than a month    Review of Systems  Constitutional:  Negative for chills, fatigue and fever.  Respiratory:  Negative for chest tightness, shortness of breath and wheezing.   Cardiovascular:  Negative for chest pain.  Gastrointestinal:  Positive for abdominal pain. Negative for blood in stool, constipation, diarrhea and vomiting.  Genitourinary:  Positive for frequency and pelvic  pain. Negative for dysuria, flank pain, urgency, vaginal bleeding, vaginal discharge and vaginal pain.  Musculoskeletal:  Negative for back pain.  Neurological:  Negative for weakness, light-headedness and headaches.  Psychiatric/Behavioral:  The patient is nervous/anxious.    Physical Exam   Blood pressure 118/70, pulse 70, temperature 98.2 F (36.8 C), resp. rate 18, last menstrual period 03/29/2021.  Physical Exam Vitals and nursing note reviewed. Exam conducted with a chaperone present.  Constitutional:      General: She is not in acute distress.    Appearance: Normal appearance.  HENT:     Head: Normocephalic.  Pulmonary:     Effort: Pulmonary effort is normal.  Abdominal:     Palpations: Abdomen is soft.     Tenderness: There is abdominal tenderness. There is no right CVA tenderness, left CVA tenderness or guarding.  Genitourinary:    Vagina: Normal.     Cervix: Discharge  present.     Comments: Small amount of white mucous vaginal discharge noted  Musculoskeletal:     Cervical back: Normal range of motion.  Skin:    General: Skin is warm and dry.  Neurological:     Mental Status: She is alert and oriented to person, place, and time.  Psychiatric:        Mood and Affect: Mood normal.    FHT: 150bpm moderate variability, 15x15 accels, no decels Reactive NST. Toco: No contractions,  Uterine irritability.    MAU Course  Procedures Lab Orders         Wet prep, genital         Urinalysis, Routine w reflex microscopic         Fern Test    Meds ordered this encounter  Medications   cyclobenzaprine (FLEXERIL) tablet 10 mg   Results for orders placed or performed during the hospital encounter of 12/05/21 (from the past 24 hour(s))  Urinalysis, Routine w reflex microscopic     Status: Abnormal   Collection Time: 12/05/21  8:54 PM  Result Value Ref Range   Color, Urine YELLOW YELLOW   APPearance HAZY (A) CLEAR   Specific Gravity, Urine 1.019 1.005 - 1.030   pH 6.0 5.0 - 8.0   Glucose, UA NEGATIVE NEGATIVE mg/dL   Hgb urine dipstick NEGATIVE NEGATIVE   Bilirubin Urine NEGATIVE NEGATIVE   Ketones, ur NEGATIVE NEGATIVE mg/dL   Protein, ur 30 (A) NEGATIVE mg/dL   Nitrite NEGATIVE NEGATIVE   Leukocytes,Ua TRACE (A) NEGATIVE   RBC / HPF 0-5 0 - 5 RBC/hpf   WBC, UA 0-5 0 - 5 WBC/hpf   Bacteria, UA NONE SEEN NONE SEEN   Squamous Epithelial / LPF 0-5 0 - 5   Mucus PRESENT      MDM Cervix closed  UA - Trace Leukocytes, but patient on prophylactic Macrobid.  Fern Negative  Wet prep-  False Labor/ [place] for discharge.   Assessment and Plan  Transfer of care to L. Amedeo Gory, CNM  Molly Doe, MSN CNM  12/05/2021, 10:31 PM   MDM:  Discussed negative fern, closed cervix with no evidence of labor with pt.  Offered second cervical exam 1+ hour after previous exam but pt declined, reporting contractions improved since arriving in  MAU.  D/C home with labor precautions.  [facility] scheduled app[facility] at [facility].  A/P: 1. UTI in pregnancy, second trimester   2. Threatened preterm labor, third trimester   3. [redacted] weeks gestation of pregnancy      D/C  home  Molly Rose, CNM 12:33 AM

## 2021-12-06 DIAGNOSIS — Z3A36 36 weeks gestation of pregnancy: Secondary | ICD-10-CM | POA: Diagnosis not present

## 2021-12-06 DIAGNOSIS — O4703 False labor before 37 completed weeks of gestation, third trimester: Secondary | ICD-10-CM | POA: Diagnosis not present

## 2021-12-06 LAB — GC/CHLAMYDIA PROBE AMP (~~LOC~~) NOT AT ARMC
Chlamydia: NEGATIVE
Comment: NEGATIVE
Comment: NORMAL
Neisseria Gonorrhea: NEGATIVE

## 2021-12-06 NOTE — Discharge Instructions (Signed)
Reasons to return to MAU at [facility]:  Since you are preterm, return to MAU if:  1.  Contractions are 10 minutes apart or less and they becoming more uncomfortable or painful over time 2.  You have a large gush of fluid, or a trickle of fluid that will not stop and you have to wear a pad 3.  You have bleeding that is bright red, heavier than spotting--like menstrual bleeding (spotting can be normal in early labor or after a check of your cervix) 4.  You do not feel the baby moving like he/she normally does  

## 2021-12-10 ENCOUNTER — Ambulatory Visit (INDEPENDENT_AMBULATORY_CARE_PROVIDER_SITE_OTHER): Payer: Medicaid Other | Admitting: Obstetrics & Gynecology

## 2021-12-10 VITALS — BP 110/70 | HR 77 | Wt 140.0 lb

## 2021-12-10 DIAGNOSIS — Z3403 Encounter for supervision of normal first pregnancy, third trimester: Secondary | ICD-10-CM

## 2021-12-10 DIAGNOSIS — Z3A36 36 weeks gestation of pregnancy: Secondary | ICD-10-CM

## 2021-12-10 DIAGNOSIS — Z3400 Encounter for supervision of normal first pregnancy, unspecified trimester: Secondary | ICD-10-CM

## 2021-12-10 NOTE — Progress Notes (Signed)
   PRENATAL VISIT NOTE  Subjective:  Molly Rose is a 21 y.o. G1P0 at [redacted]w[redacted]d being seen today for ongoing prenatal care.  She is currently monitored for the following issues for this low-risk pregnancy and has Migraine without aura and without status migrainosus, not intractable; Complicated migraine; Episodic tension-type headache; Supervision of normal first pregnancy, antepartum; Carrier of spinal muscular atrophy; UTI in pregnancy, second trimester; and Pyelonephritis affecting pregnancy, antepartum on their problem list.  Patient reports no complaints.  Contractions: Irregular. Vag. Bleeding: None.  Movement: Present. Denies leaking of fluid.   The following portions of the patient's history were reviewed and updated as appropriate: allergies, current medications, past family history, past medical history, past social history, past surgical history and problem list.   Objective:   Vitals:   12/10/21 1436  BP: 110/70  Pulse: 77  Weight: 140 lb (63.5 kg)    Fetal Status: Fetal Heart Rate (bpm): 155 Fundal Height: 34 cm Movement: Present  Presentation: Vertex (on handheld bedside scan)  General:  Alert, oriented and cooperative. Patient is in no acute distress.  Skin: Skin is warm and dry. No rash noted.   Cardiovascular: Normal heart rate noted  Respiratory: Normal respiratory effort, no problems with respiration noted  Abdomen: Soft, gravid, appropriate for gestational age.  Pain/Pressure: Present     Pelvic: GBS culture obtained in the presence of a chaperone, patient declined cervical exam        Extremities: Normal range of motion.  Edema: None  Mental Status: Normal mood and affect. Normal behavior. Normal judgment and thought content.   Assessment and Plan:  Pregnancy: G1P0 at [redacted]w[redacted]d 1. [redacted] weeks gestation of pregnancy 2. Supervision of normal first pregnancy, antepartum GBS culture obtained, will follow up results and manage accordingly. Negative GC/Chlam cultures  recently - Strep Gp B Culture+Rflx Fundal height reassuring today, but already scheduled for growth scan on 12/17/21, will follow up results and manage accordingly.  Preterm labor symptoms and general obstetric precautions including but not limited to vaginal bleeding, contractions, leaking of fluid and fetal movement were reviewed in detail with the patient. Please refer to After Visit Summary for other counseling recommendations.   Return in about 1 week (around 12/17/2021) for OFFICE OB VISIT (MD or APP).  Future Appointments  Date Time Provider Department Center  12/17/2021  3:30 PM [facility] US3 [facility]    Jaynie Collins, MD

## 2021-12-10 NOTE — Patient Instructions (Signed)
Return to office for any scheduled appointments. Call the office or go to the MAU at [facility] if: You begin to have strong, frequent contractions Your water breaks.  Sometimes it is a big gush of fluid, sometimes it is just a trickle that keeps getting your underwear wet or running down your legs You have vaginal bleeding.  It is normal to have a small amount of spotting if your cervix was checked.  You do not feel your baby moving like normal.  If you do not, get something to eat and drink and lay down and focus on feeling your baby move.   If your baby is still not moving like normal, you should call the office or go to MAU. Any other obstetric concerns.  

## 2021-12-14 LAB — STREP GP B CULTURE+RFLX: Strep Gp B Culture+Rflx: NEGATIVE

## 2021-12-17 ENCOUNTER — Ambulatory Visit (HOSPITAL_BASED_OUTPATIENT_CLINIC_OR_DEPARTMENT_OTHER): Payer: Medicaid Other

## 2021-12-17 ENCOUNTER — Ambulatory Visit: Payer: Medicaid Other

## 2021-12-17 ENCOUNTER — Ambulatory Visit (INDEPENDENT_AMBULATORY_CARE_PROVIDER_SITE_OTHER): Payer: Medicaid Other | Admitting: Obstetrics and Gynecology

## 2021-12-17 ENCOUNTER — Ambulatory Visit: Payer: Medicaid Other | Attending: Obstetrics and Gynecology | Admitting: *Deleted

## 2021-12-17 ENCOUNTER — Encounter: Payer: Self-pay | Admitting: *Deleted

## 2021-12-17 VITALS — BP 112/78 | HR 75 | Wt 141.0 lb

## 2021-12-17 VITALS — BP 106/58 | HR 61

## 2021-12-17 DIAGNOSIS — O26843 Uterine size-date discrepancy, third trimester: Secondary | ICD-10-CM

## 2021-12-17 DIAGNOSIS — Z3400 Encounter for supervision of normal first pregnancy, unspecified trimester: Secondary | ICD-10-CM

## 2021-12-17 DIAGNOSIS — O285 Abnormal chromosomal and genetic finding on antenatal screening of mother: Secondary | ICD-10-CM

## 2021-12-17 DIAGNOSIS — Z3A37 37 weeks gestation of pregnancy: Secondary | ICD-10-CM

## 2021-12-17 DIAGNOSIS — L299 Pruritus, unspecified: Secondary | ICD-10-CM

## 2021-12-17 DIAGNOSIS — Z148 Genetic carrier of other disease: Secondary | ICD-10-CM | POA: Diagnosis not present

## 2021-12-17 DIAGNOSIS — O2342 Unspecified infection of urinary tract in pregnancy, second trimester: Secondary | ICD-10-CM

## 2021-12-17 MED ORDER — CETIRIZINE HCL 10 MG PO TABS: 10.0000 mg | ORAL_TABLET | Freq: Every day | ORAL | 1 refills | Status: DC

## 2021-12-17 NOTE — Addendum Note (Signed)
Addended by: Catalina Antigua on: 12/17/2021 03:40 PM   Modules accepted: Orders

## 2021-12-17 NOTE — Progress Notes (Signed)
   PRENATAL VISIT NOTE  Subjective:  Cerise J Ferne Coe is a 21 y.o. G1P0 at [redacted]w[redacted]d being seen today for ongoing prenatal care.  She is currently monitored for the following issues for this low-risk pregnancy and has Migraine without aura and without status migrainosus, not intractable; Complicated migraine; Episodic tension-type headache; Supervision of normal first pregnancy, antepartum; Carrier of spinal muscular atrophy; UTI in pregnancy, second trimester; and Pyelonephritis affecting pregnancy, antepartum on their problem list.  Patient reports  itching, all over, red palms (rash) .  Contractions: Irritability. Vag. Bleeding: None.  Movement: Present. Denies leaking of fluid. Benadryl helped on hands which helped with sx a bit. Feet started itching first after pedicure. Notes some abd pain on R side, some pelvic pressure.   The following portions of the patient's history were reviewed and updated as appropriate: allergies, current medications, past family history, past medical history, past social history, past surgical history and problem list.   Objective:   Vitals:   12/17/21 1338  BP: 112/78  Pulse: 75  Weight: 141 lb (64 kg)    Fetal Status: Fetal Heart Rate (bpm): 156   Movement: Present     General:  Alert, oriented and cooperative. Patient is in no acute distress.  Skin: Skin is warm and dry. No rash noted.   Cardiovascular: Normal heart rate noted  Respiratory: Normal respiratory effort, no problems with respiration noted  Abdomen: Soft, gravid, appropriate for gestational age.  Pain/Pressure: Present     Pelvic: Cervical exam deferred        Extremities: Normal range of motion.  Edema: None  Mental Status: Normal mood and affect. Normal behavior. Normal judgment and thought content.   Assessment and Plan:  Pregnancy: G1P0 at [redacted]w[redacted]d 1. Supervision of normal first pregnancy, antepartum SVE 1/0/-3  2. Pruritus Onset 2 days. Pt has GB. Some R abd pain intermittently. Will  obtain cholestasis labs.  - Bile acids, total - Comprehensive metabolic panel - cetirizine (ZYRTEC ALLERGY) 10 MG tablet; Take 1 tablet (10 mg total) by mouth daily.  Dispense: 30 tablet; Refill: 1  3. Uterine size date discrepancy pregnancy, third trimester 35cm at [redacted]w[redacted]d. Growth [place] pending today.   4. [redacted] weeks gestation of pregnancy    Term labor symptoms and general obstetric precautions including but not limited to vaginal bleeding, contractions, leaking of fluid and fetal movement were reviewed in detail with the patient. Please refer to After Visit Summary for other counseling recommendations.     Future Appointments  Date Time Provider Department Center  12/17/2021  3:15 PM [facility] NURSE [facility]  12/17/2021  3:30 PM [facility]    Lahoma Crocker Mercado-Ortiz, DO

## 2021-12-17 NOTE — Progress Notes (Signed)
Patient states she noted scant reddish spotting after cervical exam in the OB office.

## 2021-12-17 NOTE — Progress Notes (Signed)
ROB c/o itching all over her body.

## 2021-12-19 ENCOUNTER — Telehealth (HOSPITAL_COMMUNITY): Payer: Self-pay | Admitting: *Deleted

## 2021-12-19 ENCOUNTER — Other Ambulatory Visit: Payer: Self-pay | Admitting: Advanced Practice Midwife

## 2021-12-19 ENCOUNTER — Other Ambulatory Visit: Payer: Self-pay | Admitting: Family Medicine

## 2021-12-19 ENCOUNTER — Encounter (HOSPITAL_COMMUNITY): Payer: Self-pay | Admitting: *Deleted

## 2021-12-19 ENCOUNTER — Telehealth: Payer: Self-pay | Admitting: Emergency Medicine

## 2021-12-19 DIAGNOSIS — K831 Obstruction of bile duct: Secondary | ICD-10-CM

## 2021-12-19 DIAGNOSIS — O26643 Intrahepatic cholestasis of pregnancy, third trimester: Secondary | ICD-10-CM

## 2021-12-19 NOTE — Telephone Encounter (Signed)
Preadmission screen  

## 2021-12-19 NOTE — H&P (Signed)
OBSTETRIC ADMISSION HISTORY AND PHYSICAL  Molly Rose is a 21 y.o. female G1P0 with IUP at 56w0dby LMP presenting for IOL due to cholestasis of pregnancy. She reports +FMs, no LOF, no VB, no blurry vision, headaches, peripheral edema, or RUQ pain.  She plans on breastfeeding. She is planning to use OCPs for birth control postpartum.   [facility]   Dating: By LMP --->  Estimated Date of Delivery: 01/03/22  Sono:   @[redacted]w[redacted]d , CWD, normal anatomy, cephalic presentation, circumvallate posterior placental lie, 2689 g, 13% EFW  Prenatal History/Complications:  Cholestasis - diagnosed 7/26  Carrier for spinal muscular atrophy Hx pyelonephritis in pregnancy  UTI in pregnancy (On Macrobid suppression)   Past Medical History: Past Medical History:  Diagnosis Date   Cholestasis during pregnancy    Migraine    UTI (urinary tract infection)     Past Surgical History: Past Surgical History:  Procedure Laterality Date   WISDOM TOOTH EXTRACTION      Obstetrical History: OB History     Gravida  1   Para      Term      Preterm      AB      Living  0      SAB      IAB      Ectopic      Multiple      Live Births              Social History Social History   Socioeconomic History   Marital status: Significant Other    Spouse name: Not on file   Number of children: Not on file   Years of education: Not on file   Highest education level: Not on file  Occupational History   Not on file  Tobacco Use   Smoking status: Never   Smokeless tobacco: Never  Vaping Use   Vaping Use: Former  Substance and Sexual Activity   Alcohol use: No    Alcohol/week: 0.0 standard drinks of alcohol   Drug use: No   Sexual activity: Not Currently  Other Topics Concern   Not on file  Social History Narrative   Not on file   Social Determinants of Health   Financial Resource Strain: Not on file  Food Insecurity: Not on file   Transportation Needs: Not on file  Physical Activity: Not on file  Stress: Not on file  Social Connections: Not on file    Family History: Family History  Problem Relation Age of Onset   Healthy Mother    Healthy Father     Allergies: Allergies  Allergen Reactions   Rocephin [Ceftriaxone] Anaphylaxis    See MAU note from 07/25/2021. Hives on face, anaphylaxis reaction with drop in O2 saturation.    Other     Seasonal Allergies   Shellfish Allergy     Medications Prior to Admission  Medication Sig Dispense Refill Last Dose   nitrofurantoin, macrocrystal-monohydrate, (MACROBID) 100 MG capsule Take 100 mg by mouth daily.   12/19/2021   Blood Pressure Monitoring (BLOOD PRESSURE KIT) DEVI 1 kit by Does not apply route as needed. 1 each 0    cetirizine (ZYRTEC ALLERGY) 10 MG tablet Take 1 tablet (10 mg total) by mouth daily. 30 tablet 1    EPINEPHrine 0.3 mg/0.3 mL IJ SOAJ injection Inject 0.3 mg into the muscle as needed for anaphylaxis. (Patient not taking: Reported on 09/24/2021) 1 each 1  famotidine (PEPCID) 20 MG tablet Take 1 tablet (20 mg total) by mouth 2 (two) times daily. 60 tablet 1    Misc. Devices (GOJJI WEIGHT SCALE) MISC 1 Device by Does not apply route as needed. 1 each 0    ondansetron (ZOFRAN-ODT) 4 MG disintegrating tablet Take 1 tablet (4 mg total) by mouth every 8 (eight) hours as needed for nausea or vomiting. 30 tablet 0    Prenatal Vit-Fe Fumarate-FA (PRENATAL VITAMIN) 27-0.8 MG TABS Take 1 tablet by mouth daily. 30 tablet 12      Review of Systems  All systems reviewed and negative except as stated in HPI  Blood pressure 131/88, temperature 97.7 F (36.5 C), temperature source Oral, resp. rate 17, height 5' (1.524 m), weight 65 kg, last menstrual period 03/29/2021.  General appearance: alert, cooperative, and no distress Lungs: normal work of breathing on room air  Heart: normal rate, warm and well perfused  Abdomen: soft, non-tender;, gravid   Extremities: no LE edema or calf tenderness to palpation   Presentation: Cephalic per RN Fetal monitoring: Baseline 125 bpm, moderate variability, + accels, no decels  Uterine activity: Occasional contractions  Dilation: 1 Effacement (%): Thick Station: -3 Exam by:: Orene Desanctis, RN  Prenatal labs: ABO, Rh: --/--/O POS (07/27 0121) Antibody: NEG (07/27 0121) Rubella: 1.20 (06/13 1645) RPR: Non Reactive (05/30 1019)  HBsAg: Negative (02/01 1623)  HIV: Non Reactive (05/30 1019)  GBS: Negative/-- (07/17 1515)  2 hr Glucola normal  Genetic screening - LR NIPS, 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, AFP negative  Anatomy US normal   Prenatal Transfer Tool  Maternal Diabetes: No Genetic Screening: As above  Maternal Ultrasounds/Referrals: Normal Fetal Ultrasounds or other Referrals:  None Maternal Substance Abuse:  No Significant Maternal Medications:  Macrobid daily for UTI prophylaxis  Significant Maternal Lab Results: Group B Strep negative  Results for orders placed or performed during the hospital encounter of 12/20/21 (from the past 24 hour(s))  CBC   Collection Time: 12/20/21  1:21 AM  Result Value Ref Range   WBC 7.9 4.0 - 10.5 K/uL   RBC 3.95 3.87 - 5.11 MIL/uL   Hemoglobin 12.5 12.0 - 15.0 g/dL   HCT 36.6 36.0 - 46.0 %   MCV 92.7 80.0 - 100.0 fL   MCH 31.6 26.0 - 34.0 pg   MCHC 34.2 30.0 - 36.0 g/dL   RDW 12.7 11.5 - 15.5 %   Platelets 146 (L) 150 - 400 K/uL   nRBC 0.0 0.0 - 0.2 %  Type and screen   Collection Time: 12/20/21  1:21 AM  Result Value Ref Range   ABO/RH(D) O POS    Antibody Screen NEG    Sample Expiration      12/23/2021,2359 Performed at [facility], [address] [place], [place] [address]     Patient Active Problem List   Diagnosis Date Noted   Cholestasis 12/20/2021   Uterine size date discrepancy pregnancy, third trimester 12/17/2021   Pyelonephritis affecting pregnancy, antepartum 10/08/2021   UTI in pregnancy, second trimester 07/25/2021   Carrier of  spinal muscular atrophy 07/14/2021   Supervision of normal first pregnancy, antepartum 06/22/2021   Migraine without aura and without status migrainosus, not intractable 25/49/8264   Complicated migraine 15/83/0940   Episodic tension-type headache 09/18/2015    Assessment/Plan:  Molly Rose is a 21 y.o. G1P0 at 58w0dhere for IOL due to cholestasis.   #Labor: Induction started with 50 mcg of buccal Cytotec. Will reassess in  4 hours and consider foley balloon placement at that time as appropriate.  #Pain: PRN  #FWB: Cat 1 #ID:  GBS neg #MOF: Breast  #MOC: OCPs  #Cholestasis: Diagnosed on 7/26 with bile acids 124.3. Normal AST, ALT mildly elevated at 44 from check in office. Will repeat CMP on admission. No complaints of itching at this time, improved with Zyrtec. Will treat PRN.   Genia Del, MD  12/20/2021, 3:13 AM

## 2021-12-19 NOTE — Telephone Encounter (Signed)
Received phone call form Lab with critical value: 124.3 Bile Acids.  Provider and patient aware per provider note.

## 2021-12-20 ENCOUNTER — Inpatient Hospital Stay (HOSPITAL_COMMUNITY): Payer: Medicaid Other | Admitting: Anesthesiology

## 2021-12-20 ENCOUNTER — Encounter (HOSPITAL_COMMUNITY): Payer: Self-pay | Admitting: Obstetrics & Gynecology

## 2021-12-20 ENCOUNTER — Inpatient Hospital Stay (HOSPITAL_COMMUNITY): Payer: Medicaid Other

## 2021-12-20 ENCOUNTER — Inpatient Hospital Stay (HOSPITAL_COMMUNITY)
Admission: AD | Admit: 2021-12-20 | Discharge: 2021-12-23 | DRG: 805 | Disposition: A | Discharge status: Home / Self Care | Payer: Medicaid Other | Attending: Obstetrics and Gynecology | Admitting: Obstetrics and Gynecology

## 2021-12-20 DIAGNOSIS — O2662 Liver and biliary tract disorders in childbirth: Secondary | ICD-10-CM | POA: Diagnosis present

## 2021-12-20 DIAGNOSIS — O1414 Severe pre-eclampsia complicating childbirth: Secondary | ICD-10-CM | POA: Diagnosis present

## 2021-12-20 DIAGNOSIS — O1413 Severe pre-eclampsia, third trimester: Secondary | ICD-10-CM | POA: Diagnosis not present

## 2021-12-20 DIAGNOSIS — Z148 Genetic carrier of other disease: Secondary | ICD-10-CM

## 2021-12-20 DIAGNOSIS — O26643 Intrahepatic cholestasis of pregnancy, third trimester: Secondary | ICD-10-CM | POA: Diagnosis present

## 2021-12-20 DIAGNOSIS — Z3A38 38 weeks gestation of pregnancy: Secondary | ICD-10-CM | POA: Diagnosis not present

## 2021-12-20 DIAGNOSIS — K831 Obstruction of bile duct: Secondary | ICD-10-CM | POA: Diagnosis present

## 2021-12-20 DIAGNOSIS — R7401 Elevation of levels of liver transaminase levels: Secondary | ICD-10-CM | POA: Diagnosis present

## 2021-12-20 DIAGNOSIS — O26843 Uterine size-date discrepancy, third trimester: Secondary | ICD-10-CM | POA: Diagnosis present

## 2021-12-20 DIAGNOSIS — O2300 Infections of kidney in pregnancy, unspecified trimester: Secondary | ICD-10-CM | POA: Diagnosis present

## 2021-12-20 LAB — COMPREHENSIVE METABOLIC PANEL
ALT: 44 IU/L — ABNORMAL HIGH (ref 0–32)
ALT: 47 U/L — ABNORMAL HIGH (ref 0–44)
ALT: 51 U/L — ABNORMAL HIGH (ref 0–44)
AST: 33 IU/L (ref 0–40)
AST: 41 U/L (ref 15–41)
AST: 39 U/L (ref 15–41)
Albumin/Globulin Ratio: 1.6 (ref 1.2–2.2)
Albumin: 3.8 g/dL — ABNORMAL LOW (ref 4.0–5.0)
Albumin: 2.8 g/dL — ABNORMAL LOW (ref 3.5–5.0)
Albumin: 2.9 g/dL — ABNORMAL LOW (ref 3.5–5.0)
Alkaline Phosphatase: 266 IU/L — ABNORMAL HIGH (ref 44–121)
Alkaline Phosphatase: 218 U/L — ABNORMAL HIGH (ref 38–126)
Alkaline Phosphatase: 231 U/L — ABNORMAL HIGH (ref 38–126)
Anion gap: 7 (ref 5–15)
Anion gap: 7 (ref 5–15)
BUN/Creatinine Ratio: 14 (ref 9–23)
BUN: 11 mg/dL (ref 6–20)
BUN: 12 mg/dL (ref 6–20)
BUN: 8 mg/dL (ref 6–20)
Bilirubin Total: 0.5 mg/dL (ref 0.0–1.2)
CO2: 18 mmol/L — ABNORMAL LOW (ref 20–29)
CO2: 21 mmol/L — ABNORMAL LOW (ref 22–32)
CO2: 22 mmol/L (ref 22–32)
Calcium: 9.3 mg/dL (ref 8.7–10.2)
Calcium: 8.6 mg/dL — ABNORMAL LOW (ref 8.9–10.3)
Calcium: 8.8 mg/dL — ABNORMAL LOW (ref 8.9–10.3)
Chloride: 104 mmol/L (ref 96–106)
Chloride: 107 mmol/L (ref 98–111)
Chloride: 109 mmol/L (ref 98–111)
Creatinine, Ser: 0.81 mg/dL (ref 0.57–1.00)
Creatinine, Ser: 0.8 mg/dL (ref 0.44–1.00)
Creatinine, Ser: 0.79 mg/dL (ref 0.44–1.00)
GFR, Estimated: >60 mL/min (ref >60)
GFR, Estimated: >60 mL/min (ref >60)
Globulin, Total: 2.4 g/dL (ref 1.5–4.5)
Glucose, Bld: 100 mg/dL — ABNORMAL HIGH (ref 70–99)
Glucose, Bld: 84 mg/dL (ref 70–99)
Glucose: 98 mg/dL (ref 70–99)
Potassium: 4.1 mmol/L (ref 3.5–5.2)
Potassium: 3.7 mmol/L (ref 3.5–5.1)
Potassium: 3.7 mmol/L (ref 3.5–5.1)
Sodium: 138 mmol/L (ref 134–144)
Sodium: 135 mmol/L (ref 135–145)
Sodium: 138 mmol/L (ref 135–145)
Total Bilirubin: 0.7 mg/dL (ref 0.3–1.2)
Total Bilirubin: 0.6 mg/dL (ref 0.3–1.2)
Total Protein: 6.2 g/dL (ref 6.0–8.5)
Total Protein: 5.7 g/dL — ABNORMAL LOW (ref 6.5–8.1)
Total Protein: 6 g/dL — ABNORMAL LOW (ref 6.5–8.1)
eGFR: 106 mL/min/{1.73_m2} (ref >59)

## 2021-12-20 LAB — CBC
HCT: 36.6 % (ref 36.0–46.0)
HCT: 38.8 % (ref 36.0–46.0)
HCT: 37.5 % (ref 36.0–46.0)
Hemoglobin: 12.5 g/dL (ref 12.0–15.0)
Hemoglobin: 13.2 g/dL (ref 12.0–15.0)
Hemoglobin: 13.1 g/dL (ref 12.0–15.0)
MCH: 31.6 pg (ref 26.0–34.0)
MCH: 31.5 pg (ref 26.0–34.0)
MCH: 31.9 pg (ref 26.0–34.0)
MCHC: 34.2 g/dL (ref 30.0–36.0)
MCHC: 34 g/dL (ref 30.0–36.0)
MCHC: 34.9 g/dL (ref 30.0–36.0)
MCV: 92.7 fL (ref 80.0–100.0)
MCV: 92.6 fL (ref 80.0–100.0)
MCV: 91.2 fL (ref 80.0–100.0)
Platelets: 146 10*3/uL — ABNORMAL LOW (ref 150–400)
Platelets: 145 10*3/uL — ABNORMAL LOW (ref 150–400)
Platelets: 136 10*3/uL — ABNORMAL LOW (ref 150–400)
RBC: 3.95 MIL/uL (ref 3.87–5.11)
RBC: 4.19 MIL/uL (ref 3.87–5.11)
RBC: 4.11 MIL/uL (ref 3.87–5.11)
RDW: 12.7 % (ref 11.5–15.5)
RDW: 12.8 % (ref 11.5–15.5)
RDW: 12.8 % (ref 11.5–15.5)
WBC: 7.9 10*3/uL (ref 4.0–10.5)
WBC: 10.1 10*3/uL (ref 4.0–10.5)
WBC: 15 10*3/uL — ABNORMAL HIGH (ref 4.0–10.5)
nRBC: 0 % (ref 0.0–0.2)
nRBC: 0 % (ref 0.0–0.2)
nRBC: 0 % (ref 0.0–0.2)

## 2021-12-20 LAB — TYPE AND SCREEN
ABO/RH(D): O POS
Antibody Screen: NEGATIVE

## 2021-12-20 LAB — BILE ACIDS, TOTAL: Bile Acids Total: 124.3 umol/L (ref 0.0–10.0)

## 2021-12-20 LAB — PROTEIN / CREATININE RATIO, URINE
Creatinine, Urine: 41 mg/dL
Protein Creatinine Ratio: 0.83 mg/mg{Cre} — ABNORMAL HIGH (ref 0.00–0.15)
Total Protein, Urine: 34 mg/dL

## 2021-12-20 LAB — LIPID PANEL
Chol/HDL Ratio: 4.2 ratio (ref 0.0–4.4)
Cholesterol, Total: 249 mg/dL — ABNORMAL HIGH (ref 100–199)
HDL: 60 mg/dL (ref >39)
LDL Chol Calc (NIH): 104 mg/dL — ABNORMAL HIGH (ref 0–99)
Triglycerides: 500 mg/dL — ABNORMAL HIGH (ref 0–149)
VLDL Cholesterol Cal: 85 mg/dL — ABNORMAL HIGH (ref 5–40)

## 2021-12-20 LAB — RPR: RPR Ser Ql: NONREACTIVE

## 2021-12-20 MED ORDER — HYDRALAZINE HCL 20 MG/ML IJ SOLN: 10.0000 mg | INTRAMUSCULAR | Status: DC | PRN

## 2021-12-20 MED ORDER — ACETAMINOPHEN 325 MG PO TABS: 650.0000 mg | ORAL_TABLET | ORAL | Status: DC | PRN

## 2021-12-20 MED ORDER — LABETALOL HCL 5 MG/ML IV SOLN: 40.0000 mg | INTRAVENOUS | Status: DC | PRN

## 2021-12-20 MED ORDER — OXYCODONE-ACETAMINOPHEN 5-325 MG PO TABS: 1.0000 | ORAL_TABLET | ORAL | Status: DC | PRN

## 2021-12-20 MED ORDER — LIDOCAINE HCL (PF) 1 % IJ SOLN
INTRAMUSCULAR | Status: DC | PRN
  Administered 2021-12-20: 5 mL via EPIDURAL
  Administered 2021-12-20: 4 mL via EPIDURAL

## 2021-12-20 MED ORDER — TERBUTALINE SULFATE 1 MG/ML IJ SOLN: 0.2500 mg | Freq: Once | INTRAMUSCULAR | Status: DC | PRN

## 2021-12-20 MED ORDER — PHENYLEPHRINE 80 MCG/ML (10ML) SYRINGE FOR IV PUSH (FOR BLOOD PRESSURE SUPPORT): 80.0000 ug | PREFILLED_SYRINGE | INTRAVENOUS | Status: DC | PRN

## 2021-12-20 MED ORDER — ONDANSETRON HCL 4 MG/2ML IJ SOLN: 4.0000 mg | Freq: Four times a day (QID) | INTRAMUSCULAR | Status: DC | PRN

## 2021-12-20 MED ORDER — DIPHENHYDRAMINE HCL 50 MG/ML IJ SOLN: 12.5000 mg | INTRAMUSCULAR | Status: DC | PRN

## 2021-12-20 MED ORDER — LABETALOL HCL 5 MG/ML IV SOLN: 20.0000 mg | INTRAVENOUS | Status: DC | PRN

## 2021-12-20 MED ORDER — LACTATED RINGERS IV SOLN
500.0000 mL | Freq: Once | INTRAVENOUS | Status: AC
  Administered 2021-12-20: 500 mL via INTRAVENOUS

## 2021-12-20 MED ORDER — OXYTOCIN-SODIUM CHLORIDE 30-0.9 UT/500ML-% IV SOLN
2.5000 [IU]/h | INTRAVENOUS | Status: DC
  Administered 2021-12-21: 2.5 [IU]/h via INTRAVENOUS

## 2021-12-20 MED ORDER — OXYTOCIN-SODIUM CHLORIDE 30-0.9 UT/500ML-% IV SOLN
1.0000 m[IU]/min | INTRAVENOUS | Status: DC
  Administered 2021-12-20: 2 m[IU]/min via INTRAVENOUS
  Filled 2021-12-20: qty 500

## 2021-12-20 MED ORDER — FENTANYL CITRATE (PF) 100 MCG/2ML IJ SOLN: 50.0000 ug | INTRAMUSCULAR | Status: DC | PRN

## 2021-12-20 MED ORDER — LABETALOL HCL 5 MG/ML IV SOLN: 80.0000 mg | INTRAVENOUS | Status: DC | PRN

## 2021-12-20 MED ORDER — EPHEDRINE 5 MG/ML INJ: 10.0000 mg | INTRAVENOUS | Status: DC | PRN

## 2021-12-20 MED ORDER — MISOPROSTOL 50MCG HALF TABLET
50.0000 ug | ORAL_TABLET | ORAL | Status: DC | PRN
  Administered 2021-12-20: 50 ug via BUCCAL
  Filled 2021-12-20: qty 1

## 2021-12-20 MED ORDER — LACTATED RINGERS IV SOLN: INTRAVENOUS | Status: DC

## 2021-12-20 MED ORDER — FENTANYL-BUPIVACAINE-NACL 0.5-0.125-0.9 MG/250ML-% EP SOLN
12.0000 mL/h | EPIDURAL | Status: DC | PRN
  Administered 2021-12-20: 12 mL/h via EPIDURAL
  Filled 2021-12-20: qty 250

## 2021-12-20 MED ORDER — FENTANYL CITRATE (PF) 100 MCG/2ML IJ SOLN
100.0000 ug | INTRAMUSCULAR | Status: DC | PRN
  Administered 2021-12-20 (×4): 100 ug via INTRAVENOUS
  Filled 2021-12-20 (×4): qty 2

## 2021-12-20 MED ORDER — LACTATED RINGERS IV SOLN: INTRAVENOUS | Status: AC

## 2021-12-20 MED ORDER — LACTATED RINGERS IV SOLN: 500.0000 mL | INTRAVENOUS | Status: DC | PRN

## 2021-12-20 MED ORDER — MAGNESIUM SULFATE 40 GM/1000ML IV SOLN
2.0000 g/h | INTRAVENOUS | Status: DC
  Administered 2021-12-20: 2 g/h via INTRAVENOUS

## 2021-12-20 MED ORDER — MAGNESIUM SULFATE BOLUS VIA INFUSION
4.0000 g | Freq: Once | INTRAVENOUS | Status: AC
  Filled 2021-12-20: qty 1000

## 2021-12-20 MED ORDER — LABETALOL HCL 5 MG/ML IV SOLN
INTRAVENOUS | Status: AC
  Administered 2021-12-20: 20 mg via INTRAVENOUS
  Filled 2021-12-20: qty 4

## 2021-12-20 MED ORDER — LIDOCAINE HCL (PF) 1 % IJ SOLN: 30.0000 mL | INTRAMUSCULAR | Status: DC | PRN

## 2021-12-20 MED ORDER — OXYTOCIN BOLUS FROM INFUSION
333.0000 mL | Freq: Once | INTRAVENOUS | Status: AC
  Administered 2021-12-21: 333 mL via INTRAVENOUS

## 2021-12-20 MED ORDER — SOD CITRATE-CITRIC ACID 500-334 MG/5ML PO SOLN: 30.0000 mL | ORAL | Status: DC | PRN

## 2021-12-20 MED ORDER — OXYCODONE-ACETAMINOPHEN 5-325 MG PO TABS: 2.0000 | ORAL_TABLET | ORAL | Status: DC | PRN

## 2021-12-20 MED ORDER — MISOPROSTOL 25 MCG QUARTER TABLET: 25.0000 ug | ORAL_TABLET | ORAL | Status: DC | PRN

## 2021-12-20 MED ORDER — MAGNESIUM SULFATE 40 GM/1000ML IV SOLN
INTRAVENOUS | Status: AC
  Administered 2021-12-20: 4 g via INTRAVENOUS
  Filled 2021-12-20: qty 1000

## 2021-12-20 NOTE — Progress Notes (Signed)
Molly Rose is a 21 y.o. G1P0 at [redacted]w[redacted]d by LMP admitted for induction of labor due to Cholestasis.  Subjective: Patient growing uncomfortable with contractions despite bouncing on ball and frequent position changes. She received IV Pain management with some relief.    Objective: BP 126/66 (BP Location: Left Arm)   Pulse 62   Temp 98.5 F (36.9 C) (Axillary)   Resp 20   Ht 5' (1.524 m)   Wt 65 kg   LMP 03/29/2021   SpO2 100%   BMI 28.01 kg/m  No intake/output data recorded. No intake/output data recorded.  FHT:  FHR: 140 bpm, variability: moderate,  accelerations:  Present,  decelerations:  Present reoccurring variable decels with quicl return to baseline during contractions.  UC:   regular, every 3-5 minutes SVE:   Dilation: 3 Effacement (%): 50 Station: -3 Exam by:: Bernardo Heater, RN  Labs: Lab Results  Component Value Date   WBC 10.1 12/20/2021   HGB 13.2 12/20/2021   HCT 38.8 12/20/2021   MCV 92.6 12/20/2021   PLT 145 (L) 12/20/2021   Patient Vitals for the past 24 hrs:  BP Temp Temp src Pulse Resp SpO2 Height Weight  12/20/21 1502 126/66 -- -- 62 -- -- -- --  12/20/21 1500 -- 98.5 F (36.9 C) Axillary -- 20 -- -- --  12/20/21 1431 (!) 158/87 98.2 F (36.8 C) Axillary 60 20 -- -- --  12/20/21 1410 (!) 164/85 -- -- 64 18 -- -- --  12/20/21 1408 (!) 164/85 -- -- 64 -- -- -- --  12/20/21 1331 (!) 160/92 -- -- (!) 59 -- 100 % -- --  12/20/21 1315 (!) 172/95 -- -- (!) 57 -- 99 % -- --  12/20/21 1301 -- -- -- -- -- 99 % -- --  12/20/21 1256 (!) 160/87 -- -- 61 -- 100 % -- --  12/20/21 1205 132/90 -- -- 61 18 -- -- --  12/20/21 1110 -- 97.7 F (36.5 C) Axillary -- -- -- -- --  12/20/21 0904 127/74 -- -- 63 16 -- -- --  12/20/21 0803 (!) 140/93 97.8 F (36.6 C) Axillary (!) 59 16 -- -- --  12/20/21 0702 119/83 -- -- 61 18 -- -- --  12/20/21 0139 131/88 97.7 F (36.5 C) Oral -- 17 -- 5' (1.524 m) 65 kg    Assessment / Plan: Induction of labor due to  cholestasis S/p Foley balloon and cytotec x1. RN called CNM for patient BPs. Patient having recurrent severe range BPs. Labetalol Protocol initiated. PreE labs collected and pending.   Labor: S/p foley balloon, patient was contracting regularly. Continued with expectant management. Questionable signs of SROM. Patient states she has felt a "trickle" but none seen. RN continue to assess. If contractions space out or cervix remains unchanged, plan for Pit or AROM.  Preeclampsia:   Pre E labs collected. If worsening signs of PreE plan for Mag.  Fetal Wellbeing:  Category II- Continue to monitor for signs of distress.  Pain Control:  Labor support without medications and IV pain meds. If patient desires epidural pain management, patient may have one upon request.  I/D:   GBS Negative Anticipated MOD:  NSVD  Claudette Head, CNM 12/20/2021, 3:17 PM

## 2021-12-20 NOTE — Progress Notes (Signed)
Molly Rose is a 21 y.o. G1P0 at [redacted]w[redacted]d by LMP admitted for induction of labor due to Cholestasis .  Subjective: Patient is very uncomfortable with contractions and requesting her epidural.  She denies Headache blurred vision, and right sided epigastric pain.   Objective: BP (!) 143/74   Pulse 60   Temp 98.7 F (37.1 C) (Axillary)   Resp 20   Ht 5' (1.524 m)   Wt 65 kg   LMP 03/29/2021   SpO2 100%   BMI 28.01 kg/m  No intake/output data recorded. No intake/output data recorded.  FHT:  FHR: 145 bpm, variability: moderate,  accelerations:  Present,  decelerations:  Present. Early decelerations  UC:   regular, every 2-3 minutes SVE:   Dilation: 6.5 Effacement (%): 90 Station: 0 Exam by:: Rhunette Croft CNM  Labs: Lab Results  Component Value Date   WBC 10.1 12/20/2021   HGB 13.2 12/20/2021   HCT 38.8 12/20/2021   MCV 92.6 12/20/2021   PLT 145 (L) 12/20/2021   Patient Vitals for the past 24 hrs:  BP Temp Temp src Pulse Resp SpO2 Height Weight  12/20/21 1845 140/79 -- -- 68 (!) (P) 22 -- -- --  12/20/21 1803 (!) 143/74 98.7 F (37.1 C) Axillary 60 -- -- -- --  12/20/21 1720 139/67 98.8 F (37.1 C) Axillary 67 20 -- -- --  12/20/21 1600 (!) 143/72 -- -- 63 20 -- -- --  12/20/21 1502 126/66 -- -- 62 -- -- -- --  12/20/21 1500 -- 98.5 F (36.9 C) Axillary -- 20 -- -- --  12/20/21 1431 (!) 158/87 98.2 F (36.8 C) Axillary 60 20 -- -- --  12/20/21 1410 (!) 164/85 -- -- 64 18 -- -- --  12/20/21 1408 (!) 164/85 -- -- 64 -- -- -- --  12/20/21 1331 (!) 160/92 -- -- (!) 59 -- 100 % -- --  12/20/21 1315 (!) 172/95 -- -- (!) 57 -- 99 % -- --  12/20/21 1301 -- -- -- -- -- 99 % -- --  12/20/21 1256 (!) 160/87 -- -- 61 -- 100 % -- --  12/20/21 1205 132/90 -- -- 61 18 -- -- --  12/20/21 1110 -- 97.7 F (36.5 C) Axillary -- -- -- -- --  12/20/21 0904 127/74 -- -- 63 16 -- -- --  12/20/21 0803 (!) 140/93 97.8 F (36.6 C) Axillary (!) 59 16 -- -- --  12/20/21 0702 119/83 -- --  61 18 -- -- --  12/20/21 0139 131/88 97.7 F (36.5 C) Oral -- 17 -- 5' (1.524 m) 65 kg   Results for orders placed or performed during the hospital encounter of 12/20/21 (from the past 24 hour(s))  CBC     Status: Abnormal   Collection Time: 12/20/21  1:21 AM  Result Value Ref Range   WBC 7.9 4.0 - 10.5 K/uL   RBC 3.95 3.87 - 5.11 MIL/uL   Hemoglobin 12.5 12.0 - 15.0 g/dL   HCT 36.6 44.0 - 34.7 %   MCV 92.7 80.0 - 100.0 fL   MCH 31.6 26.0 - 34.0 pg   MCHC 34.2 30.0 - 36.0 g/dL   RDW 42.5 95.6 - 38.7 %   Platelets 146 (L) 150 - 400 K/uL   nRBC 0.0 0.0 - 0.2 %  Type and screen     Status: None   Collection Time: 12/20/21  1:21 AM  Result Value Ref Range   ABO/RH(D) O POS    Antibody Screen  NEG    Sample Expiration      12/23/2021,2359 Performed at [facility], [address] [place], [place] [address]   RPR     Status: None   Collection Time: 12/20/21  1:21 AM  Result Value Ref Range   RPR Ser Ql NON REACTIVE NON REACTIVE  Comprehensive metabolic panel     Status: Abnormal   Collection Time: 12/20/21  1:21 AM  Result Value Ref Range   Sodium 135 135 - 145 mmol/L   Potassium 3.7 3.5 - 5.1 mmol/L   Chloride 107 98 - 111 mmol/L   CO2 21 (L) 22 - 32 mmol/L   Glucose, Bld 100 (H) 70 - 99 mg/dL   BUN 12 6 - 20 mg/dL   Creatinine, Ser 0.93 0.44 - 1.00 mg/dL   Calcium 8.6 (L) 8.9 - 10.3 mg/dL   Total Protein 5.7 (L) 6.5 - 8.1 g/dL   Albumin 2.8 (L) 3.5 - 5.0 g/dL   AST 41 15 - 41 U/L   ALT 47 (H) 0 - 44 U/L   Alkaline Phosphatase 218 (H) 38 - 126 U/L   Total Bilirubin 0.7 0.3 - 1.2 mg/dL   GFR, Estimated >23 >55 mL/min   Anion gap 7 5 - 15  Comprehensive metabolic panel     Status: Abnormal   Collection Time: 12/20/21  2:19 PM  Result Value Ref Range   Sodium 138 135 - 145 mmol/L   Potassium 3.7 3.5 - 5.1 mmol/L   Chloride 109 98 - 111 mmol/L   CO2 22 22 - 32 mmol/L   Glucose, Bld 84 70 - 99 mg/dL   BUN 8 6 - 20 mg/dL   Creatinine, Ser 7.32 0.44 - 1.00 mg/dL    Calcium 8.8 (L) 8.9 - 10.3 mg/dL   Total Protein 6.0 (L) 6.5 - 8.1 g/dL   Albumin 2.9 (L) 3.5 - 5.0 g/dL   AST 39 15 - 41 U/L   ALT 51 (H) 0 - 44 U/L   Alkaline Phosphatase 231 (H) 38 - 126 U/L   Total Bilirubin 0.6 0.3 - 1.2 mg/dL   GFR, Estimated >20 >25 mL/min   Anion gap 7 5 - 15  CBC     Status: Abnormal   Collection Time: 12/20/21  2:19 PM  Result Value Ref Range   WBC 10.1 4.0 - 10.5 K/uL   RBC 4.19 3.87 - 5.11 MIL/uL   Hemoglobin 13.2 12.0 - 15.0 g/dL   HCT 42.7 06.2 - 37.6 %   MCV 92.6 80.0 - 100.0 fL   MCH 31.5 26.0 - 34.0 pg   MCHC 34.0 30.0 - 36.0 g/dL   RDW 28.3 15.1 - 76.1 %   Platelets 145 (L) 150 - 400 K/uL   nRBC 0.0 0.0 - 0.2 %  Protein / creatinine ratio, urine     Status: Abnormal   Collection Time: 12/20/21  3:36 PM  Result Value Ref Range   Creatinine, Urine 41 mg/dL   Total Protein, Urine 34 mg/dL   Protein Creatinine Ratio 0.83 (H) 0.00 - 0.15 mg/mg[Cre]    Assessment / Plan: Induction of labor due to Cholestasis, S/p cytotec x1 and FB. Developed PreE w/ SF intrapartum. Severe range BPs treated with Labetalol x1.   Labor:  Discussed AROM and Pit, after epidural placement.  Preeclampsia: Mag initiated. 4g Bolus with 2g maintenance.  on magnesium sulfate-continue to assess for worsening signs of PreE and/or Mag toxicity.  Fetal Wellbeing:  Category II-continue to  monitor for fetal distress.  Pain Control:  Epidural and IV pain meds Patient may have epidural upon request.  I/D:   GBS Neg Anticipated MOD:  NSVD  Claudette Head, CNM 12/20/2021, 6:42 PM

## 2021-12-20 NOTE — Anesthesia Procedure Notes (Signed)
Epidural Patient location during procedure: OB Start time: 12/20/2021 8:45 PM End time: 12/20/2021 8:48 PM  Staffing Anesthesiologist: Beryle Lathe, MD Performed: anesthesiologist   Preanesthetic Checklist Completed: patient identified, IV checked, risks and benefits discussed, monitors and equipment checked, pre-op evaluation and timeout performed  Epidural Patient position: sitting Prep: DuraPrep Patient monitoring: continuous pulse ox and blood pressure Approach: midline Location: L2-L3 Injection technique: LOR saline  Needle:  Needle type: Tuohy  Needle gauge: 17 G Needle length: 9 cm Needle insertion depth: 4.5 cm Catheter size: 19 Gauge Catheter at skin depth: 9 cm Test dose: negative and Other (1% lidocaine)  Assessment Events: blood not aspirated  Additional Notes Patient identified. Risks including, but not limited to, bleeding, infection, nerve damage, paralysis, inadequate analgesia, blood pressure changes, nausea, vomiting, allergic reaction, postpartum back pain, itching, and headache were discussed. Patient expressed understanding and wished to proceed. Sterile prep and drape, including hand hygiene, mask, and sterile gloves were used. The patient was positioned and the spine was prepped. The skin was anesthetized with lidocaine. No paraesthesia or other complication noted. The patient did not experience any signs of intravascular injection such as tinnitus or metallic taste in mouth, nor signs of intrathecal spread such as rapid motor block. Please see nursing notes for vital signs. The patient tolerated the procedure well.   Leslye Peer, MDReason for block:procedure for pain

## 2021-12-20 NOTE — Progress Notes (Addendum)
Labor Progress Note Molly Rose is a 21 y.o. G1P0 at [redacted]w[redacted]d presented for IOL for cholestasis. S: In to introduce myself to patient, RN at bedside. She is reporting that she is comfortable on the ball, leaning forward. FB placed at 0700 by Dr. Mathis Fare. Patient reports increasing sensation of pain/pressure with CTXs following FB placement. Otherwise no concerns at this time. FOB at bedside.  O:  BP 127/74 (BP Location: Left Arm)   Pulse 63   Temp 97.8 F (36.6 C) (Axillary)   Resp 16   Ht 5' (1.524 m)   Wt 65 kg   LMP 03/29/2021   BMI 28.01 kg/m  EFM: 140 bpm/moderate variability/+accels, no decels  CVE: Dilation: 2 Effacement (%): 50 Station: -3 Presentation: Vertex Exam by:: Mathis Fare, MD   A&P: 21 y.o. G1P0 [redacted]w[redacted]d IOL for cholestasis #Labor: Progressing well. FB in place, continue to pull and maintain traction as indicated. Plan to reassess in 1-2 hrs, sooner if needed. #Pain: PRN, considering epidural if needed but desires labor unmedicated at this time #FWB: Category 1, will continue to monitor #GBS negative   Raylene Everts, MD 9:56 AM   I confirm that I have verified and agree with the information documented in the resident's note.   Carlynn Herald, CNM 12/20/2021 10:28 AM

## 2021-12-20 NOTE — Discharge Summary (Signed)
Postpartum Discharge Summary  Date of Service updated***     Patient Name: Molly Rose DOB: Sep 11, 2000 MRN: [id number]  Date of admission: 12/20/2021 Delivery date:12/21/2021  Delivering provider: Seabron Spates  Date of discharge: 12/23/2021  Admitting diagnosis: Cholestasis [K83.1] Intrauterine pregnancy: [redacted]w[redacted]d    Secondary diagnosis:  Principal Problem:   Cholestasis during pregnancy in third trimester Active Problems:   Carrier of spinal muscular atrophy   Preeclampsia, severe, third trimester   Transaminitis  Additional problems: ***    Discharge diagnosis: Term Pregnancy Delivered and Preeclampsia (severe)                                              Post partum procedures:{Postpartum procedures:23558} Augmentation: Pitocin, Cytotec, and IP Foley Complications: None  Hospital course: Induction of Labor With Vaginal Delivery   21y.o. yo G1P0 at 318w1das admitted to the hospital 12/20/2021 for induction of labor.  Indication for induction:  cholestasis .  Patient had an uncomplicated labor course as follows: Membrane Rupture Time/Date: 8:58 PM ,12/20/2021   Delivery Method:Vaginal, Spontaneous  Episiotomy: None  Lacerations:  1st degree;Vaginal;Periurethral  Details of delivery can be found in separate delivery note.  Patient had a routine postpartum course. Patient is discharged home 12/23/21.  Newborn Data: Birth date:12/21/2021  Birth time:4:58 AM  Gender:Female  Living status:Living  Apgars:7 ,9  Weight:2910 g   Magnesium Sulfate received: Yes: Seizure prophylaxis BMZ received: No Rhophylac:N/A MMR:N/A T-DaP:Given prenatally Flu: No Transfusion:{Transfusion received:30440034}  Physical exam  Vitals:   12/22/21 1938 12/23/21 0014 12/23/21 0409 12/23/21 0845  BP: (!) 141/82 121/71 124/73 129/81  Pulse: 61 60 (!) 56 (!) 58  Resp: 20 20 16 16   Temp: 98.2 F (36.8 C) 98 F (36.7 C) 97.7 F (36.5 C) 98.4 F (36.9 C)  TempSrc: Oral Oral Oral  Oral  SpO2: 99% 100% 100% 96%  Weight:      Height:       General: {Exam; general:21111117} Lochia: {Desc; appropriate/inappropriate:30686::"appropriate"} Uterine Fundus: {Desc; firm/soft:30687} Incision: {Exam; incision:21111123} DVT Evaluation: {Exam; dvLID:0301314}abs: Lab Results  Component Value Date   WBC 16.3 (H) 12/21/2021   HGB 11.2 (L) 12/21/2021   HCT 31.7 (L) 12/21/2021   MCV 91.1 12/21/2021   PLT 122 (L) 12/21/2021      Latest Ref Rng & Units 12/23/2021    9:45 AM  CMP  Glucose 70 - 99 mg/dL 74   BUN 6 - 20 mg/dL 9   Creatinine 0.44 - 1.00 mg/dL 0.75   Sodium 135 - 145 mmol/L 137   Potassium 3.5 - 5.1 mmol/L 3.8   Chloride 98 - 111 mmol/L 111   CO2 22 - 32 mmol/L 23   Calcium 8.9 - 10.3 mg/dL 7.8   Total Protein 6.5 - 8.1 g/dL 4.6   Total Bilirubin 0.3 - 1.2 mg/dL 0.5   Alkaline Phos 38 - 126 U/L 144   AST 15 - 41 U/L 81   ALT 0 - 44 U/L 69    Edinburgh Score:    12/22/2021    6:00 AM  Edinburgh Postnatal Depression Scale Screening Tool  I have been able to laugh and see the funny side of things. 3  I have looked forward with enjoyment to things. 0  I have blamed myself unnecessarily when things went wrong. 0  I have  been anxious or worried for no good reason. 0  I have felt scared or panicky for no good reason. 0  Things have been getting on top of me. 0  I have been so unhappy that I have had difficulty sleeping. 0  I have felt sad or miserable. 0  I have been so unhappy that I have been crying. 0  The thought of harming myself has occurred to me. 0  Edinburgh Postnatal Depression Scale Total 3     After visit meds:  Allergies as of 12/23/2021       Reactions   Rocephin [ceftriaxone] Anaphylaxis   See MAU note from 07/25/2021. Hives on face, anaphylaxis reaction with drop in O2 saturation.    Other    Seasonal Allergies   Shellfish Allergy         Medication List     STOP taking these medications    nitrofurantoin  (macrocrystal-monohydrate) 100 MG capsule Commonly known as: MACROBID       TAKE these medications    Blood Pressure Kit Devi 1 kit by Does not apply route as needed.   cetirizine 10 MG tablet Commonly known as: ZyrTEC Allergy Take 1 tablet (10 mg total) by mouth daily.   docusate sodium 100 MG capsule Commonly known as: Colace Take 1 capsule (100 mg total) by mouth 2 (two) times daily as needed for mild constipation.   EPINEPHrine 0.3 mg/0.3 mL Soaj injection Commonly known as: EPI-PEN Inject 0.3 mg into the muscle as needed for anaphylaxis.   famotidine 20 MG tablet Commonly known as: PEPCID Take 1 tablet (20 mg total) by mouth 2 (two) times daily.   ferrous sulfate 325 (65 FE) MG tablet Take 1 tablet (325 mg total) by mouth every other day. Start taking on: December 25, 2021   Gojji Weight Scale Misc 1 Device by Does not apply route as needed.   ibuprofen 600 MG tablet Commonly known as: ADVIL Take 1 tablet (600 mg total) by mouth every 12 (twelve) hours as needed.   NIFEdipine 30 MG 24 hr tablet Commonly known as: ADALAT CC Take 1 tablet (30 mg total) by mouth daily. Start taking on: December 24, 2021   ondansetron 4 MG disintegrating tablet Commonly known as: ZOFRAN-ODT Take 1 tablet (4 mg total) by mouth every 8 (eight) hours as needed for nausea or vomiting.   Prenatal Vitamin 27-0.8 MG Tabs Take 1 tablet by mouth daily.         Discharge home in stable condition Infant Feeding: {Baby feeding:23562} Infant Disposition:{CHL IP OB HOME WITH PPJKDT:26712} Discharge instruction: per After Visit Summary and Postpartum booklet. Activity: Advance as tolerated. Pelvic rest for 6 weeks.  Diet: {OB WPYK:99833825} Future Appointments: Future Appointments  Date Time Provider [place]  12/31/2021 11:00 AM [place] None  02/06/2022  3:50 PM Griffin Basil, MD [place] None   Follow up Visit:  [place].  Go in 9 day(s).   Why: blood pressure check Contact information: [address] [place] [id number] [phone]                 Please schedule this patient for a Virtual postpartum visit in 4 weeks with the following provider: Any provider. Additional Postpartum F/U:BP check 1 week  Low risk pregnancy complicated by:  ICP and preeclampsia Delivery mode:  Vaginal, Spontaneous  Anticipated Birth Control:  PP Depo given  ***  12/23/2021 Aletha Halim, MD

## 2021-12-20 NOTE — Anesthesia Preprocedure Evaluation (Signed)
Anesthesia Evaluation  Patient identified by MRN, date of birth, ID band Patient awake    Reviewed: Allergy & Precautions, NPO status , Patient's Chart, lab work & pertinent test results  History of Anesthesia Complications Negative for: history of anesthetic complications  Airway Mallampati: II   Neck ROM: Full    Dental   Pulmonary neg pulmonary ROS,    Pulmonary exam normal        Cardiovascular negative cardio ROS Normal cardiovascular exam     Neuro/Psych  Headaches, negative psych ROS   GI/Hepatic negative GI ROS, Neg liver ROS,   Endo/Other  negative endocrine ROS  Renal/GU negative Renal ROS     Musculoskeletal negative musculoskeletal ROS (+)   Abdominal   Peds  Hematology  Plt 145k    Anesthesia Other Findings   Reproductive/Obstetrics (+) Pregnancy  Pre-E                              Anesthesia Physical Anesthesia Plan  ASA: 2  Anesthesia Plan: Epidural   Post-op Pain Management:    Induction:   PONV Risk Score and Plan: 2 and Treatment may vary due to age or medical condition  Airway Management Planned: Natural Airway  Additional Equipment: None  Intra-op Plan:   Post-operative Plan:   Informed Consent: I have reviewed the patients History and Physical, chart, labs and discussed the procedure including the risks, benefits and alternatives for the proposed anesthesia with the patient or authorized representative who has indicated his/her understanding and acceptance.       Plan Discussed with: Anesthesiologist  Anesthesia Plan Comments: (Labs reviewed. Platelets acceptable, patient not taking any blood thinning medications. Per RN, FHR tracing reported to be stable enough for sitting procedure. Risks and benefits discussed with patient, including PDPH, backache, epidural hematoma, failed epidural, blood pressure changes, allergic reaction, and nerve  injury. Patient expressed understanding and wished to proceed.)        Anesthesia Quick Evaluation

## 2021-12-20 NOTE — Progress Notes (Signed)
Labor Progress Note Molly Rose is a 21 y.o. G1P0 at [redacted]w[redacted]d who presented for IOL due to cholestasis.   S: Resting comfortably, no concerns.   O:  BP 131/88   Temp 97.7 F (36.5 C) (Oral)   Resp 17   Ht 5' (1.524 m)   Wt 65 kg   LMP 03/29/2021   BMI 28.01 kg/m   EFM: Baseline 135 bpm, moderate variability, + accels, no decels  Toco: Every 1-2 minutes   CVE: Dilation: 2 Effacement (%): 50 Station: -3 Presentation: Vertex Exam by:: Mathis Fare, MD  A&P: 21 y.o. G1P0 [redacted]w[redacted]d   #Labor: Progressing well. Foley balloon discussed and patient verbally consented. Foley balloon placed without difficulty and filled with 60 cc. Mom and baby tolerated this well. Contracting frequently, every 1-2 minutes. Will hold on additional Cytotec for now. Plan to reassess in 4 hours, sooner as needed.  #Pain: PRN; coping well  #FWB: Cat 1  #GBS negative  Worthy Rancher, MD 7:10 AM

## 2021-12-20 NOTE — Progress Notes (Signed)
Patient Vitals for the past 4 hrs:  BP Temp Temp src Pulse Resp SpO2  12/20/21 2200 (!) 112/49 -- -- (!) 103 -- 100 %  12/20/21 2130 107/63 -- -- 78 -- 100 %  12/20/21 2125 118/80 -- -- 89 -- 99 %  12/20/21 2120 116/65 -- -- 90 -- 99 %  12/20/21 2115 108/65 -- -- 95 -- 99 %  12/20/21 2100 122/67 -- -- (!) 103 -- 99 %  12/20/21 2050 129/71 -- -- (!) 115 -- 99 %  12/20/21 2010 -- -- -- -- -- 98 %  12/20/21 2000 136/84 -- -- 94 -- 99 %  12/20/21 1952 -- -- -- -- -- 99 %  12/20/21 1950 -- -- -- -- 14 --  12/20/21 1930 122/71 -- -- 82 -- --  12/20/21 1906 (!) 123/59 98.8 F (37.1 C) Axillary 93 (!) 22 --  12/20/21 1901 125/67 98.6 F (37 C) Axillary 91 -- --  12/20/21 1856 125/63 (!) 97.5 F (36.4 C) Axillary 72 20 --  12/20/21 1851 139/68 -- -- 71 -- --  12/20/21 1847 (!) 152/74 -- -- 64 -- --  12/20/21 1845 140/79 -- -- 68 (!) 22 --   Comfortable w/epidural.  FHR Cat 1.  Ctx spaced out to q 5-7 minutes after starting MgSo4.  Cx 8/90/-1.  Had SROM at 2100, clear fluid.  Pitocin started to augment protracted active phase.

## 2021-12-21 ENCOUNTER — Encounter (HOSPITAL_COMMUNITY): Payer: Self-pay | Admitting: Obstetrics & Gynecology

## 2021-12-21 ENCOUNTER — Other Ambulatory Visit: Payer: Self-pay

## 2021-12-21 DIAGNOSIS — O2662 Liver and biliary tract disorders in childbirth: Secondary | ICD-10-CM

## 2021-12-21 DIAGNOSIS — O1414 Severe pre-eclampsia complicating childbirth: Secondary | ICD-10-CM

## 2021-12-21 DIAGNOSIS — Z3A38 38 weeks gestation of pregnancy: Secondary | ICD-10-CM

## 2021-12-21 LAB — CBC
HCT: 31.7 % — ABNORMAL LOW (ref 36.0–46.0)
Hemoglobin: 11.2 g/dL — ABNORMAL LOW (ref 12.0–15.0)
MCH: 32.2 pg (ref 26.0–34.0)
MCHC: 35.3 g/dL (ref 30.0–36.0)
MCV: 91.1 fL (ref 80.0–100.0)
Platelets: 122 10*3/uL — ABNORMAL LOW (ref 150–400)
RBC: 3.48 MIL/uL — ABNORMAL LOW (ref 3.87–5.11)
RDW: 13 % (ref 11.5–15.5)
WBC: 16.3 10*3/uL — ABNORMAL HIGH (ref 4.0–10.5)
nRBC: 0 % (ref 0.0–0.2)

## 2021-12-21 LAB — COMPREHENSIVE METABOLIC PANEL
ALT: 63 U/L — ABNORMAL HIGH (ref 0–44)
AST: 106 U/L — ABNORMAL HIGH (ref 15–41)
Albumin: 2.4 g/dL — ABNORMAL LOW (ref 3.5–5.0)
Alkaline Phosphatase: 178 U/L — ABNORMAL HIGH (ref 38–126)
Anion gap: 5 (ref 5–15)
BUN: 5 mg/dL — ABNORMAL LOW (ref 6–20)
CO2: 24 mmol/L (ref 22–32)
Calcium: 7.3 mg/dL — ABNORMAL LOW (ref 8.9–10.3)
Chloride: 107 mmol/L (ref 98–111)
Creatinine, Ser: 0.83 mg/dL (ref 0.44–1.00)
GFR, Estimated: >60 mL/min (ref >60)
Glucose, Bld: 96 mg/dL (ref 70–99)
Potassium: 4 mmol/L (ref 3.5–5.1)
Sodium: 136 mmol/L (ref 135–145)
Total Bilirubin: 1.2 mg/dL (ref 0.3–1.2)
Total Protein: 5 g/dL — ABNORMAL LOW (ref 6.5–8.1)

## 2021-12-21 LAB — CBC WITH DIFFERENTIAL/PLATELET
Abs Immature Granulocytes: 0.15 10*3/uL — ABNORMAL HIGH (ref 0.00–0.07)
Basophils Absolute: 0 10*3/uL (ref 0.0–0.1)
Basophils Relative: 0 %
Eosinophils Absolute: 0.2 10*3/uL (ref 0.0–0.5)
Eosinophils Relative: 1 %
HCT: 35.6 % — ABNORMAL LOW (ref 36.0–46.0)
Hemoglobin: 12.4 g/dL (ref 12.0–15.0)
Immature Granulocytes: 1 %
Lymphocytes Relative: 6 %
Lymphs Abs: 1.2 10*3/uL (ref 0.7–4.0)
MCH: 32 pg (ref 26.0–34.0)
MCHC: 34.8 g/dL (ref 30.0–36.0)
MCV: 91.8 fL (ref 80.0–100.0)
Monocytes Absolute: 1.1 10*3/uL — ABNORMAL HIGH (ref 0.1–1.0)
Monocytes Relative: 6 %
Neutro Abs: 16.9 10*3/uL — ABNORMAL HIGH (ref 1.7–7.7)
Neutrophils Relative %: 86 %
Platelets: 151 10*3/uL (ref 150–400)
RBC: 3.88 MIL/uL (ref 3.87–5.11)
RDW: 13 % (ref 11.5–15.5)
WBC: 19.6 10*3/uL — ABNORMAL HIGH (ref 4.0–10.5)
nRBC: 0 % (ref 0.0–0.2)

## 2021-12-21 MED ORDER — MEDROXYPROGESTERONE ACETATE 150 MG/ML IM SUSP: 150.0000 mg | INTRAMUSCULAR | Status: DC | PRN

## 2021-12-21 MED ORDER — DIPHENHYDRAMINE HCL 25 MG PO CAPS: 25.0000 mg | ORAL_CAPSULE | Freq: Four times a day (QID) | ORAL | Status: DC | PRN

## 2021-12-21 MED ORDER — ACETAMINOPHEN 325 MG PO TABS: 650.0000 mg | ORAL_TABLET | ORAL | Status: DC | PRN

## 2021-12-21 MED ORDER — BISACODYL 10 MG RE SUPP: 10.0000 mg | Freq: Every day | RECTAL | Status: DC | PRN

## 2021-12-21 MED ORDER — SIMETHICONE 80 MG PO CHEW: 80.0000 mg | CHEWABLE_TABLET | ORAL | Status: DC | PRN

## 2021-12-21 MED ORDER — COCONUT OIL OIL: 1.0000 | TOPICAL_OIL | Status: DC | PRN

## 2021-12-21 MED ORDER — ONDANSETRON HCL 4 MG PO TABS: 4.0000 mg | ORAL_TABLET | ORAL | Status: DC | PRN

## 2021-12-21 MED ORDER — METHYLERGONOVINE MALEATE 0.2 MG/ML IJ SOLN: 0.2000 mg | INTRAMUSCULAR | Status: DC | PRN

## 2021-12-21 MED ORDER — PRENATAL MULTIVITAMIN CH
1.0000 | ORAL_TABLET | Freq: Every day | ORAL | Status: DC
  Administered 2021-12-21 – 2021-12-23 (×3): 1 via ORAL
  Filled 2021-12-21 (×3): qty 1

## 2021-12-21 MED ORDER — METHYLERGONOVINE MALEATE 0.2 MG PO TABS: 0.2000 mg | ORAL_TABLET | ORAL | Status: DC | PRN

## 2021-12-21 MED ORDER — BENZOCAINE-MENTHOL 20-0.5 % EX AERO
1.0000 | INHALATION_SPRAY | CUTANEOUS | Status: DC | PRN
  Filled 2021-12-21: qty 56

## 2021-12-21 MED ORDER — ONDANSETRON HCL 4 MG/2ML IJ SOLN: 4.0000 mg | INTRAMUSCULAR | Status: DC | PRN

## 2021-12-21 MED ORDER — SENNOSIDES-DOCUSATE SODIUM 8.6-50 MG PO TABS
2.0000 | ORAL_TABLET | ORAL | Status: DC
  Administered 2021-12-21 – 2021-12-23 (×3): 2 via ORAL
  Filled 2021-12-21 (×3): qty 2

## 2021-12-21 MED ORDER — IBUPROFEN 600 MG PO TABS
600.0000 mg | ORAL_TABLET | Freq: Four times a day (QID) | ORAL | Status: DC
  Administered 2021-12-21 – 2021-12-23 (×9): 600 mg via ORAL
  Filled 2021-12-21 (×9): qty 1

## 2021-12-21 MED ORDER — FERROUS SULFATE 325 (65 FE) MG PO TABS
325.0000 mg | ORAL_TABLET | ORAL | Status: DC
  Administered 2021-12-21 – 2021-12-23 (×2): 325 mg via ORAL
  Filled 2021-12-21 (×2): qty 1

## 2021-12-21 MED ORDER — WITCH HAZEL-GLYCERIN EX PADS: 1.0000 | MEDICATED_PAD | CUTANEOUS | Status: DC | PRN

## 2021-12-21 MED ORDER — MAGNESIUM SULFATE 40 GM/1000ML IV SOLN
2.0000 g/h | INTRAVENOUS | Status: AC
  Administered 2021-12-21: 2 g/h via INTRAVENOUS
  Filled 2021-12-21: qty 1000

## 2021-12-21 MED ORDER — MEASLES, MUMPS & RUBELLA VAC IJ SOLR: 0.5000 mL | Freq: Once | INTRAMUSCULAR | Status: DC

## 2021-12-21 MED ORDER — DIBUCAINE (PERIANAL) 1 % EX OINT: 1.0000 | TOPICAL_OINTMENT | CUTANEOUS | Status: DC | PRN

## 2021-12-21 MED ORDER — TETANUS-DIPHTH-ACELL PERTUSSIS 5-2.5-18.5 LF-MCG/0.5 IM SUSY: 0.5000 mL | PREFILLED_SYRINGE | Freq: Once | INTRAMUSCULAR | Status: DC

## 2021-12-21 MED ORDER — FLEET ENEMA 7-19 GM/118ML RE ENEM: 1.0000 | ENEMA | Freq: Every day | RECTAL | Status: DC | PRN

## 2021-12-21 NOTE — Progress Notes (Signed)
Patient Vitals for the past 4 hrs:  BP Temp Pulse Resp SpO2  12/21/21 0200 108/64 -- 92 -- 100 %  12/21/21 0156 -- -- -- -- 100 %  12/21/21 0151 -- -- -- -- 100 %  12/21/21 0100 116/62 -- (!) 107 -- --  12/21/21 0031 123/73 -- 96 -- --  12/21/21 0012 123/70 -- 88 -- --  12/21/21 0007 -- 98 F (36.7 C) -- -- --  12/21/21 0000 -- -- -- 16 --  12/20/21 2300 113/74 -- 87 17 99 %  12/20/21 2230 120/68 -- 99 -- --   Cx noted to be C/C/ +1/+2 around 0100.  Ctx q 2-4 minutes, pushed for about 1.5 hours.  Baby coming down, but pt fatigued.  Will labor down for a little bit then resume pushing.  FHR 130s w/moderate variability, early/mild variable decels w/ctx. Pitocin at 10 mu/min.

## 2021-12-21 NOTE — Lactation Note (Signed)
This note was copied from a baby's chart. Lactation Consultation Note  Patient Name: Molly Rose [id number] Date: 12/21/2021 Reason for consult: Initial assessment;Primapara;1st time breastfeeding;Early term 37-38.6wks;Breastfeeding assistance Age:21 hours  P1, Early Term, Infant Female  LC entered the room and baby was asleep STS with the birthing parent. Per the birthing parent, baby has been breastfeeding and staying on for 5 min. She states that when she hand expresses, she is able to see drops, but was unsure if baby was able to get anything.   LC spoke with the birthing parent about milk production in the early days, feeding cues, and pumping frequency, cluster feeding, milk storage guidelines, and outpatient LC services.   The birthing parent states that she has no further questions or concerns.   The birthing parent will call RN/LC for assistance with latch if needed.   Current Feeding Plan:  Breast/ chest feed the baby 8+ times in 24 hours according to feeding cues.  Put baby to the breast before supplementing with expressed milk. Pump q3hrs for stimulation and feed expressed milk to baby via a bottle.  Call RN/LC for assistance with latch.    Maternal Data Has patient been taught Hand Expression?: Yes Does the patient have breastfeeding experience prior to this delivery?: No   Interventions Interventions: Breast feeding basics reviewed;Education;LC Services brochure  Discharge    Consult Status Consult Status: Follow-up Date: 12/22/21 Follow-up type: In-patient    [name] 12/21/2021, 1:37 PM

## 2021-12-21 NOTE — Lactation Note (Signed)
This note was copied from a baby's chart. Lactation Consultation Note  Patient Name: Molly Rose [id number] Date: 12/21/2021   Age:21 hours  LC attempted to visit with the birthing parent. RN states that the pediatrician is in the room. LC will follow-up later.   Maternal Data    Feeding    LATCH Score                    Lactation Tools Discussed/Used    Interventions    Discharge    Consult Status      [name] 12/21/2021, 11:20 AM

## 2021-12-21 NOTE — Lactation Note (Signed)
This note was copied from a baby's chart. Lactation Consultation Note  Patient Name: Molly Rose [id number] Date: 12/21/2021   Age:21 hours  LC attempted to visit with the birthing parent. The parent states that they would like for LC to come back later. Will follow up later.    Hartwell Vandiver P Justine Cossin 12/21/2021, 11:31 AM

## 2021-12-21 NOTE — Anesthesia Postprocedure Evaluation (Signed)
Anesthesia Post Note  Patient: Molly Rose  Procedure(s) Performed: AN AD HOC LABOR EPIDURAL     Patient location during evaluation: Mother Baby Anesthesia Type: Epidural Level of consciousness: awake and alert Pain management: pain level controlled Vital Signs Assessment: post-procedure vital signs reviewed and stable Respiratory status: spontaneous breathing, nonlabored ventilation and respiratory function stable Cardiovascular status: stable Postop Assessment: no headache, no backache and epidural receding Anesthetic complications: no   No notable events documented.  Last Vitals:  Vitals:   12/21/21 0930 12/21/21 1043  BP:  122/67  Pulse:  74  Resp: 17 16  Temp:    SpO2:  100%    Last Pain:  Vitals:   12/21/21 1043  TempSrc:   PainSc: 0-No pain   Pain Goal:                   Carvell Hoeffner

## 2021-12-22 MED ORDER — NIFEDIPINE ER OSMOTIC RELEASE 30 MG PO TB24
30.0000 mg | ORAL_TABLET | Freq: Every day | ORAL | Status: DC
Start: 2021-12-22 — End: 2021-12-23
  Administered 2021-12-22 – 2021-12-23 (×2): 30 mg via ORAL
  Filled 2021-12-22 (×2): qty 1

## 2021-12-22 NOTE — Progress Notes (Signed)
Daily Antepartum Note  Admission Date: 12/20/2021 Current Date: 12/22/2021 9:41 AM  Molly Rose is a 21 y.o. G1P1001 PPD#1 SVD/1st degree (repaired) @ [redacted]w[redacted]d, admitted for IOL for cholestasis. Patient diagnosed with severe pre-eclampsia (BPs) during labor  Pregnancy complicated by: Patient Active Problem List   Diagnosis Date Noted   Cholestasis during pregnancy in third trimester 12/20/2021   Preeclampsia, severe, third trimester 12/20/2021   Uterine size date discrepancy pregnancy, third trimester 12/17/2021   UTI in pregnancy, second trimester 07/25/2021   Carrier of spinal muscular atrophy 07/14/2021   Supervision of normal first pregnancy, antepartum 06/22/2021   Migraine without aura and without status migrainosus, not intractable 09/18/2015   Complicated migraine 09/18/2015   Episodic tension-type headache 09/18/2015    Overnight/24hr events:  Came off mg this morning Started on procardia xl; to start this morning  Subjective:  Pt doing well and meeting all PP goals.   Objective:     Current Vital Signs 24h Vital Sign Ranges  T 97.6 F (36.4 C) Temp  Avg: 97.7 F (36.5 C)  Min: 97.5 F (36.4 C)  Max: 98 F (36.7 C)  BP 123/63 BP  Min: 106/72  Max: 142/91  HR (!) 59 Pulse  Avg: 67.1  Min: 57  Max: 77  RR 14 Resp  Avg: 16.4  Min: 14  Max: 18  SaO2 99 %  (room air) SpO2  Avg: 99.7 %  Min: 99 %  Max: 100 %       24 Hour I/O Current Shift I/O  Time Ins Outs 07/28 0701 - 07/29 0700 In: 4960.2 [P.O.:1870; I.V.:3048] Out: 4350 [Urine:4350] No intake/output data recorded.   Patient Vitals for the past 24 hrs:  BP Temp Temp src Pulse Resp SpO2  12/22/21 0730 123/63 97.6 F (36.4 C) Oral (!) 59 14 99 %  12/22/21 0600 -- -- -- -- 16 --  12/22/21 0500 -- -- -- -- 16 --  12/22/21 0400 133/88 -- -- (!) 57 16 --  12/22/21 0300 -- -- -- -- 16 --  12/22/21 0200 -- -- -- -- 16 --  12/22/21 0100 -- -- -- -- 16 --  12/22/21 0001 106/72 -- -- 70 16 100 %  12/21/21  2300 -- -- -- -- 16 --  12/21/21 2200 -- -- -- -- 16 --  12/21/21 2100 -- -- -- -- 16 --  12/21/21 2003 131/83 98 F (36.7 C) Oral 66 16 --  12/21/21 1933 (!) 142/91 98 F (36.7 C) Oral 77 17 99 %  12/21/21 1923 -- -- -- -- 16 --  12/21/21 1800 -- -- -- -- 16 --  12/21/21 1700 -- -- -- -- 16 --  12/21/21 1600 -- -- -- -- 18 --  12/21/21 1528 131/84 (!) 97.5 F (36.4 C) Oral 65 18 100 %  12/21/21 1300 -- -- -- -- 18 --  12/21/21 1200 -- -- -- -- 18 --  12/21/21 1154 131/80 (!) 97.5 F (36.4 C) Other 69 18 100 %  12/21/21 1043 122/67 -- -- 74 16 100 %   Physical exam: General: Well nourished, well developed female in no acute distress. Abdomen: nttp Cardiovascular: S1, S2 normal, no murmur, rub or gallop, regular rate and rhythm Respiratory: CTAB Extremities: no clubbing, cyanosis or edema Skin: Warm and dry.   Medications: Current Facility-Administered Medications  Medication Dose Route Frequency Provider Last Rate Last Admin   benzocaine-Menthol (DERMOPLAST) 20-0.5 % topical spray 1 Application  1 Application Topical PRN   Cresenzo-Dishmon, Frances, CNM       bisacodyl (DULCOLAX) suppository 10 mg  10 mg Rectal Daily PRN Cresenzo-Dishmon, Frances, CNM       coconut oil  1 Application Topical PRN Cresenzo-Dishmon, Frances, CNM       witch hazel-glycerin (TUCKS) pad 1 Application  1 Application Topical PRN Cresenzo-Dishmon, Frances, CNM       And   dibucaine (NUPERCAINAL) 1 % rectal ointment 1 Application  1 Application Rectal PRN Cresenzo-Dishmon, Frances, CNM       diphenhydrAMINE (BENADRYL) capsule 25 mg  25 mg Oral Q6H PRN Cresenzo-Dishmon, Frances, CNM       ferrous sulfate tablet 325 mg  325 mg Oral QODAY Cresenzo-Dishmon, Frances, CNM   325 mg at 12/21/21 1036   ibuprofen (ADVIL) tablet 600 mg  600 mg Oral Q6H Cresenzo-Dishmon, Frances, CNM   600 mg at 12/22/21 0623   measles, mumps & rubella vaccine (MMR) injection 0.5 mL  0.5 mL Subcutaneous Once Cresenzo-Dishmon, Frances,  CNM       medroxyPROGESTERone (DEPO-PROVERA) injection 150 mg  150 mg Intramuscular Prior to discharge Cresenzo-Dishmon, Frances, CNM       methylergonovine (METHERGINE) tablet 0.2 mg  0.2 mg Oral Q4H PRN Cresenzo-Dishmon, Frances, CNM       Or   methylergonovine (METHERGINE) injection 0.2 mg  0.2 mg Intramuscular Q4H PRN Cresenzo-Dishmon, Frances, CNM       NIFEdipine (PROCARDIA-XL/NIFEDICAL-XL) 24 hr tablet 30 mg  30 mg Oral Daily Pickens, Charlie, MD       ondansetron (ZOFRAN) tablet 4 mg  4 mg Oral Q4H PRN Cresenzo-Dishmon, Frances, CNM       Or   ondansetron (ZOFRAN) injection 4 mg  4 mg Intravenous Q4H PRN Cresenzo-Dishmon, Frances, CNM       prenatal multivitamin tablet 1 tablet  1 tablet Oral Daily Cresenzo-Dishmon, Frances, CNM   1 tablet at 12/21/21 1036   senna-docusate (Senokot-S) tablet 2 tablet  2 tablet Oral Q24H Cresenzo-Dishmon, Frances, CNM   2 tablet at 12/22/21 0831   simethicone (MYLICON) chewable tablet 80 mg  80 mg Oral PRN Cresenzo-Dishmon, Frances, CNM       sodium phosphate (FLEET) 7-19 GM/118ML enema 1 enema  1 enema Rectal Daily PRN Cresenzo-Dishmon, Frances, CNM       Tdap (BOOSTRIX) injection 0.5 mL  0.5 mL Intramuscular Once Cresenzo-Dishmon, Frances, CNM        Labs:  Recent Labs  Lab 12/20/21 1957 12/21/21 0712 12/21/21 1448  WBC 15.0* 19.6* 16.3*  HGB 13.1 12.4 11.2*  HCT 37.5 35.6* 31.7*  PLT 136* 151 122*    Recent Labs  Lab 12/20/21 0121 12/20/21 1419 12/21/21 1448  NA 135 138 136  K 3.7 3.7 4.0  CL 107 109 107  CO2 21* 22 24  BUN 12 8 5*  CREATININE 0.80 0.79 0.83  CALCIUM 8.6* 8.8* 7.3*  PROT 5.7* 6.0* 5.0*  BILITOT 0.7 0.6 1.2  ALKPHOS 218* 231* 178*  ALT 47* 51* 63*  AST 41 39 106*  GLUCOSE 100* 84 96   Radiology:  No new imaging  Assessment & Plan:  Pt stable *PP: routine care. O POS. Girl. Breast. D/w pt re: BC tomorrow *Severe pre-eclampsia: follow BPs on meds now *Dispo: likely tomorrow. Needs 1wk bp check  Charlie  Pickens, Jr MD Attending [facility]) GYN Consult Phone: [phone] (M-F, [phone]) & [phone]  (Off hours, weekends, holidays)  

## 2021-12-22 NOTE — Lactation Note (Addendum)
This note was copied from a baby's chart. Lactation Consultation Note  Patient Name: Molly Rose [id number] Date: 12/22/2021 Reason for consult: Follow-up assessment;Mother's request;Difficult latch;Primapara;1st time breastfeeding;Early term 37-38.6wks;Infant weight loss;Breastfeeding assistance Age:21 hours LC reviewed latch positions with Birth parent to get more depth. We changed from cradle to cross cradle after giving 1 ml on spoon. Infant little sleepy after bath, did not sustain the latch.  Birth parent pumping with DEBP to bottle feed infant.   Plan 1. To feed based on cues 8-12x 24hr period. Mom to offer breasts and look for signs of milk transfer 2. Birth parent to supplement with EBM via pace bottle feeding 7-12 ml per feeding. BP aware to offer more if infant not latching.  3. Birth parent pump after latching for 15 mins.   Birth parent/ support parent aware DBM is an option for supplementation.  Findings above shared with RN, Aggie Hacker.  Mom has a Motif pump for home.   Maternal Data Has patient been taught Hand Expression?: Yes  Feeding Mother's Current Feeding Choice: Breast Milk  LATCH Score Latch: Repeated attempts needed to sustain latch, nipple held in mouth throughout feeding, stimulation needed to elicit sucking reflex.  Audible Swallowing: Spontaneous and intermittent  Type of Nipple: Everted at rest and after stimulation (short shafted but will evert with stimulation)  Comfort (Breast/Nipple): Soft / non-tender  Hold (Positioning): Assistance needed to correctly position infant at breast and maintain latch.  LATCH Score: 8   Lactation Tools Discussed/Used Tools: Pump;Flanges Flange Size: 21 Breast pump type: Double-Electric Breast Pump Pump Education: Setup, frequency, and cleaning;Milk Storage Reason for Pumping: increase stimulation Pumping frequency: post pump after feeding for 15 mins  Interventions Interventions: Breast feeding  basics reviewed;Assisted with latch;Skin to skin;Breast massage;Hand express;Pre-pump if needed;Breast compression;Adjust position;Support pillows;Position options;Expressed milk;Hand pump;DEBP;Education;Pace feeding;LC Psychologist, educational;Infant Driven Feeding Algorithm education  Discharge Pump: Personal;DEBP (Motiff pump at home) [facility] Program: No (Mom provided with WIC number to call on Monday to start cerftification process.)  Consult Status Consult Status: Follow-up Date: 12/23/21 Follow-up type: In-patient    Okey Zelek  Nicholson-Springer 12/22/2021, 1:31 PM

## 2021-12-23 DIAGNOSIS — R7401 Elevation of levels of liver transaminase levels: Secondary | ICD-10-CM | POA: Diagnosis present

## 2021-12-23 LAB — COMPREHENSIVE METABOLIC PANEL
ALT: 69 U/L — ABNORMAL HIGH (ref 0–44)
AST: 81 U/L — ABNORMAL HIGH (ref 15–41)
Albumin: 2.1 g/dL — ABNORMAL LOW (ref 3.5–5.0)
Alkaline Phosphatase: 144 U/L — ABNORMAL HIGH (ref 38–126)
Anion gap: 3 — ABNORMAL LOW (ref 5–15)
BUN: 9 mg/dL (ref 6–20)
CO2: 23 mmol/L (ref 22–32)
Calcium: 7.8 mg/dL — ABNORMAL LOW (ref 8.9–10.3)
Chloride: 111 mmol/L (ref 98–111)
Creatinine, Ser: 0.75 mg/dL (ref 0.44–1.00)
GFR, Estimated: >60 mL/min (ref >60)
Glucose, Bld: 74 mg/dL (ref 70–99)
Potassium: 3.8 mmol/L (ref 3.5–5.1)
Sodium: 137 mmol/L (ref 135–145)
Total Bilirubin: 0.5 mg/dL (ref 0.3–1.2)
Total Protein: 4.6 g/dL — ABNORMAL LOW (ref 6.5–8.1)

## 2021-12-23 MED ORDER — DOCUSATE SODIUM 100 MG PO CAPS: 100.0000 mg | ORAL_CAPSULE | Freq: Two times a day (BID) | ORAL | 1 refills | Status: DC | PRN

## 2021-12-23 MED ORDER — NIFEDIPINE ER 30 MG PO TB24: 30.0000 mg | ORAL_TABLET | Freq: Every day | ORAL | 0 refills | Status: DC

## 2021-12-23 MED ORDER — FERROUS SULFATE 325 (65 FE) MG PO TABS: 325.0000 mg | ORAL_TABLET | ORAL | 0 refills | Status: DC

## 2021-12-23 MED ORDER — IBUPROFEN 600 MG PO TABS: 600.0000 mg | ORAL_TABLET | Freq: Two times a day (BID) | ORAL | 0 refills | Status: DC | PRN

## 2021-12-23 NOTE — Discharge Instructions (Signed)
Continue to check your blood pressures twice a day Call the office for blood pressures that are consistently above 140 for the top number or 90 for the bottom number   Hypertension During Pregnancy Hypertension is also called high blood pressure. High blood pressure means that the force of your blood moving in your body is too strong. It can cause problems for you and your baby. Different types of high blood pressure can happen during pregnancy. The types are: High blood pressure before you got pregnant. This is called chronic hypertension.  This can continue during your pregnancy. Your doctor will want to keep checking your blood pressure. You may need medicine to keep your blood pressure under control while you are pregnant. You will need follow-up visits after you have your baby. High blood pressure that goes up during pregnancy when it was normal before. This is called gestational hypertension. It will usually get better after you have your baby, but your doctor will need to watch your blood pressure to make sure that it is getting better. Very high blood pressure during pregnancy. This is called preeclampsia. Very high blood pressure is an emergency that needs to be checked and treated right away. You may develop very high blood pressure after giving birth. This is called postpartum preeclampsia. This usually occurs within 48 hours after childbirth but may occur up to 6 weeks after giving birth. This is rare. How does this affect me? If you have high blood pressure during pregnancy, you have a higher chance of developing high blood pressure: As you get older. If you get pregnant again. In some cases, high blood pressure during pregnancy can cause: Stroke. Heart attack. Damage to the kidneys, lungs, or liver. Preeclampsia. Jerky movements you cannot control (convulsions or seizures). Problems with the placenta.  What can I do to lower my risk?  Keep a healthy weight. Eat a healthy  diet. Follow what your doctor tells you about treating any medical problems that you had before becoming pregnant. It is very important to go to all of your doctor visits. Your doctor will check your blood pressure and make sure that your pregnancy is progressing as it should. Treatment should start early if a problem is found.  Follow these instructions at home:  Take your blood pressure 1-2 times per day. Call the office if your blood pressure is 155 or higher for the top number or 105 or higher for the bottom number.    Eating and drinking  Drink enough fluid to keep your pee (urine) pale yellow. Avoid caffeine. Lifestyle Do not use any products that contain nicotine or tobacco, such as cigarettes, e-cigarettes, and chewing tobacco. If you need help quitting, ask your doctor. Do not use alcohol or drugs. Avoid stress. Rest and get plenty of sleep. Regular exercise can help. Ask your doctor what kinds of exercise are best for you. General instructions Take over-the-counter and prescription medicines only as told by your doctor. Keep all prenatal and follow-up visits as told by your doctor. This is important. Contact a doctor if: You have symptoms that your doctor told you to watch for, such as: Headaches. Nausea. Vomiting. Belly (abdominal) pain. Dizziness. Light-headedness. Get help right away if: You have: Very bad belly pain that does not get better with treatment. A very bad headache that does not get better. Vomiting that does not get better. Sudden, fast weight gain. Sudden swelling in your hands, ankles, or face. Blood in your pee. Blurry vision. Double vision.   Shortness of breath. Chest pain. Weakness on one side of your body. Trouble talking. Summary High blood pressure is also called hypertension. High blood pressure means that the force of your blood moving in your body is too strong. High blood pressure can cause problems for you and your baby. Keep all  follow-up visits as told by your doctor. This is important. This information is not intended to replace advice given to you by your health care provider. Make sure you discuss any questions you have with your health care provider. Document Released: 06/15/2010 Document Revised: 09/03/2018 Document Reviewed: 06/09/2018 Elsevier Patient Education  2020 Elsevier Inc. 

## 2021-12-23 NOTE — Progress Notes (Signed)
Pt sleeping. Will come back later to round. BPs look good on procardia. I forgot to order cmp for this morning so one ordered. Cornelia Copa MD Attending [facility] [employer] [facility] (Faculty Practice) 12/23/2021 Time: [phone]

## 2021-12-23 NOTE — Lactation Note (Addendum)
This note was copied from a baby's chart. Lactation Consultation Note  Patient Name: Molly Rose Date: 12/23/2021 Reason for consult: Follow-up assessment;Primapara;1st time breastfeeding;Early term 37-38.6wks;Infant weight loss;Breastfeeding assistance (7.04% WL) Age:21 hours  P1, Early Term, Infant Female, 7.04% WL  LC entered the room and the baby was in the bassinet.   Per the birthing parent, things are going well with breastfeeding. The birthing parent states that she has been putting baby to the breast for 5 min per breast and pumping afterwards. She states that she has finally started to produce more milk when pumping. Per the birthing parent, she pumped 50mL this morning and was able to feed that to her baby via a bottle.   LC praised the birthing parent for her efforts and let her know that the milk volume should continue to increase with stimulation. LC encouraged the birthing parent to continue to put the baby and continue to pump to establish her supply. The birthing parent has instructions on how to decrease pumping sessions if needed to avoid an oversupply.   The birthing person states that she is unsure if she is going home today. LC encouraged the parent to call the [facility] if needed for assistance with latch or to review lactation discharge information.   The parent states that she would like to go over discharge information at a later time because she is eating.   Maternal Data    Feeding Nipple Type: Slow - flow  LATCH Score                    Lactation Tools Discussed/Used    Interventions Interventions: Breast feeding basics reviewed;Education  Discharge    Consult Status Consult Status: Follow-up Date: 12/24/21 Follow-up type: In-patient    Molly Rose 12/23/2021, 11:21 AM

## 2021-12-25 ENCOUNTER — Encounter: Payer: Medicaid Other | Admitting: Obstetrics and Gynecology

## 2021-12-31 ENCOUNTER — Ambulatory Visit (INDEPENDENT_AMBULATORY_CARE_PROVIDER_SITE_OTHER): Payer: Medicaid Other

## 2021-12-31 VITALS — BP 106/72 | HR 73 | Wt 120.5 lb

## 2021-12-31 DIAGNOSIS — Z0130 Encounter for examination of blood pressure without abnormal findings: Secondary | ICD-10-CM

## 2021-12-31 NOTE — Progress Notes (Signed)
..  Subjective:  Molly Rose is a 21 y.o. female here for BP check.   Hypertension ROS: taking medications as instructed, no medication side effects noted, no TIA's, no chest pain on exertion, no dyspnea on exertion, and no swelling of ankles.    Objective:  BP 106/72   Pulse 73   Wt 120 lb 8 oz (54.7 kg)   LMP 03/29/2021   BMI 23.53 kg/m   Appearance alert, well appearing, and in no distress. General exam BP noted to be well controlled today in office.    Assessment:   Blood Pressure well controlled.   Plan:  Current treatment plan is effective, no change in therapy. PP visit os scheduled for 02/06/22.

## 2022-02-06 ENCOUNTER — Ambulatory Visit (INDEPENDENT_AMBULATORY_CARE_PROVIDER_SITE_OTHER): Payer: Medicaid Other | Admitting: Obstetrics and Gynecology

## 2022-02-06 ENCOUNTER — Encounter: Payer: Self-pay | Admitting: Obstetrics and Gynecology

## 2022-02-06 DIAGNOSIS — Z30017 Encounter for initial prescription of implantable subdermal contraceptive: Secondary | ICD-10-CM

## 2022-02-06 DIAGNOSIS — Z8719 Personal history of other diseases of the digestive system: Secondary | ICD-10-CM

## 2022-02-06 DIAGNOSIS — Z8759 Personal history of other complications of pregnancy, childbirth and the puerperium: Secondary | ICD-10-CM

## 2022-02-06 LAB — POCT URINE PREGNANCY: Preg Test, Ur: NEGATIVE

## 2022-02-06 MED ORDER — ETONOGESTREL 68 MG ~~LOC~~ IMPL
68.0000 mg | DRUG_IMPLANT | Freq: Once | SUBCUTANEOUS | Status: AC
  Administered 2022-02-06: 68 mg via SUBCUTANEOUS

## 2022-02-06 NOTE — Progress Notes (Signed)
Post Partum Visit Note  Molly Rose is a 21 y.o. G75P1001 female who presents for a postpartum visit. She is 6 weeks postpartum following a normal spontaneous vaginal delivery.  I have fully reviewed the prenatal and intrapartum course. The delivery was at 38 gestational weeks.  Anesthesia: epidural. Postpartum course has been good. Baby is doing well. Baby is feeding by breast. Bleeding no bleeding. Bowel function is normal. Bladder function is normal. Patient is not sexually active. Contraception method is Nexplanon. Postpartum depression screening: negative.   Upstream - 02/06/22 1618       Pregnancy Intention Screening   Does the patient want to become pregnant in the next year? No    Does the patient's partner want to become pregnant in the next year? No    Would the patient like to discuss contraceptive options today? Yes            The pregnancy intention screening data noted above was reviewed. Potential methods of contraception were discussed. The patient elected to proceed with No data recorded.   Edinburgh Postnatal Depression Scale - 02/06/22 1616       Edinburgh Postnatal Depression Scale:  In the Past 7 Days   I have been able to laugh and see the funny side of things. 0    I have looked forward with enjoyment to things. 0    I have blamed myself unnecessarily when things went wrong. 0    I have been anxious or worried for no good reason. 1    I have felt scared or panicky for no good reason. 0    Things have been getting on top of me. 0    I have been so unhappy that I have had difficulty sleeping. 0    I have felt sad or miserable. 0    I have been so unhappy that I have been crying. 0    The thought of harming myself has occurred to me. 0    Edinburgh Postnatal Depression Scale Total 1             Health Maintenance Due  Topic Date Due   HPV VACCINES (1 - 2-dose series) Never done   PAP-Cervical Cytology Screening  Never done   PAP  SMEAR-Modifier  Never done   INFLUENZA VACCINE  12/25/2021    The following portions of the patient's history were reviewed and updated as appropriate: allergies, current medications, past family history, past medical history, past social history, past surgical history, and problem list.  Review of Systems Pertinent items are noted in HPI.  Objective:  BP 106/70   Pulse 66   Ht 5' (1.524 m)   Wt 121 lb 9.6 oz (55.2 kg)   LMP 03/29/2021   Breastfeeding Yes   BMI 23.75 kg/m    General:  alert, cooperative, and no distress   Breasts:  not indicated  Lungs: clear to auscultation bilaterally  Heart:  regular rate and rhythm  Abdomen: soft, non-tender; bowel sounds normal; no masses,  no organomegaly   Wound N/a  GU exam:  not indicated       Assessment:    encounter for postpartum exam  normal postpartum exam.   Plan:   Essential components of care per ACOG recommendations:  1.  Mood and well being: Patient with negative depression screening today. Reviewed local resources for support.  - Patient tobacco use? No.   - hx of drug use? No.    2.  Infant care and feeding:  -Patient currently breastmilk feeding? Yes. Reviewed importance of draining breast regularly to support lactation.  -Social determinants of health (SDOH) reviewed in EPIC. No concerns.  3. Sexuality, contraception and birth spacing - Patient does not want a pregnancy in the next year.  Desired family size is 3 children.  - Reviewed reproductive life planning. Reviewed contraceptive methods based on pt preferences and effectiveness.  Patient desired Hormonal Implant today.  Device will be placed by Dr. Jaynie Collins - Discussed birth spacing of 18 months  4. Sleep and fatigue -Encouraged family/partner/community support of 4 hrs of uninterrupted sleep to help with mood and fatigue  5. Physical Recovery  - Discussed patients delivery and complications. She describes her labor as good. - Patient had a  Vaginal, no problems at delivery. Patient had a 1st degree laceration. Perineal healing reviewed. Patient expressed understanding - Patient has urinary incontinence? No. - Patient is safe to resume physical and sexual activity  6.  Health Maintenance - HM due items addressed Yes - Last pap smear No results found for: "DIAGPAP" Pap smear not done at today's visit. Pt informed of need for pap.  She requested it be done in 1-2 months. -Breast Cancer screening indicated? No.   7. Chronic Disease/Pregnancy Condition follow up:  cholestasis of pregnancy Will recheck CMP today to eval liver function  - PCP follow up  Warden Fillers, MD [facility] for [facility], [facility]

## 2022-02-06 NOTE — Progress Notes (Signed)
    PROCEDURE NOTE  Molly Rose is a 21 y.o. G1P1001 here for Nexplanon insertion.    Nexplanon Insertion Procedure Patient identified, informed consent performed, consent signed.   Patient does understand that irregular bleeding is a very common side effect of this medication. She was advised to have backup contraception for one week after placement. Pregnancy test in clinic today was negative.  Appropriate time out taken.  Patient's left arm was prepped and draped in the usual sterile fashion. The ruler used to measure and mark insertion area.  Patient was prepped with alcohol swab and then injected with 3 ml of 1% lidocaine.  She was prepped with betadine, Nexplanon removed from packaging,  Device confirmed in needle, then inserted full length of needle and withdrawn per handbook instructions. Nexplanon was able to palpated in the patient's arm; patient palpated the insert herself. There was minimal blood loss.  Patient insertion site covered with guaze and a pressure bandage to reduce any bruising.  The patient tolerated the procedure well and was given post procedure instructions.    Jaynie Collins, MD, FACOG Obstetrician & Gynecologist, [facility] for [employer], [facility]

## 2022-02-06 NOTE — Patient Instructions (Signed)
Nexplanon Instructions After Insertion  Keep bandage clean and dry for 24 hours  May use ice/Tylenol/Ibuprofen for soreness or pain  If you develop fever, drainage or increased warmth from incision site-contact office immediately   

## 2022-02-07 LAB — COMPREHENSIVE METABOLIC PANEL
ALT: 20 IU/L (ref 0–32)
AST: 22 IU/L (ref 0–40)
Albumin/Globulin Ratio: 2.1 (ref 1.2–2.2)
Albumin: 4.7 g/dL (ref 4.0–5.0)
Alkaline Phosphatase: 92 IU/L (ref 44–121)
BUN/Creatinine Ratio: 13 (ref 9–23)
BUN: 13 mg/dL (ref 6–20)
Bilirubin Total: 0.4 mg/dL (ref 0.0–1.2)
CO2: 22 mmol/L (ref 20–29)
Calcium: 9.2 mg/dL (ref 8.7–10.2)
Chloride: 104 mmol/L (ref 96–106)
Creatinine, Ser: 1 mg/dL (ref 0.57–1.00)
Globulin, Total: 2.2 g/dL (ref 1.5–4.5)
Glucose: 76 mg/dL (ref 70–99)
Potassium: 4.3 mmol/L (ref 3.5–5.2)
Sodium: 143 mmol/L (ref 134–144)
Total Protein: 6.9 g/dL (ref 6.0–8.5)
eGFR: 82 mL/min/{1.73_m2} (ref >59)

## 2022-09-11 ENCOUNTER — Other Ambulatory Visit (HOSPITAL_COMMUNITY)
Admission: RE | Admit: 2022-09-11 | Discharge: 2022-09-11 | Disposition: A | Discharge status: Home / Self Care | Payer: Medicaid Other | Source: Ambulatory Visit | Attending: Obstetrics & Gynecology | Admitting: Obstetrics & Gynecology

## 2022-09-11 ENCOUNTER — Ambulatory Visit (INDEPENDENT_AMBULATORY_CARE_PROVIDER_SITE_OTHER): Payer: Medicaid Other | Admitting: Obstetrics & Gynecology

## 2022-09-11 VITALS — BP 104/68 | HR 65 | Wt 128.0 lb

## 2022-09-11 DIAGNOSIS — R102 Pelvic and perineal pain: Secondary | ICD-10-CM | POA: Diagnosis present

## 2022-09-11 LAB — POCT URINALYSIS DIPSTICK
Bilirubin, UA: NEGATIVE
Blood, UA: NEGATIVE
Glucose, UA: NEGATIVE
Ketones, UA: NEGATIVE
Nitrite, UA: NEGATIVE
Protein, UA: NEGATIVE
Spec Grav, UA: 1.015 (ref 1.010–1.025)
Urobilinogen, UA: 0.2 E.U./dL
pH, UA: 6 (ref 5.0–8.0)

## 2022-09-11 NOTE — Progress Notes (Signed)
Patient ID: Molly Rose, female   DOB: 2000-11-10, 22 y.o.   MRN: [id number]  Chief Complaint  Patient presents with   Gynecologic Exam    HPI Molly Rose is a 22 y.o. female.  G1P1001 No LMP recorded. She has nexplanon in place. 3 weeks of pelvic pain and dyspareunia that has been improving some. She would like her urine tested and STD testing.  HPI  Past Medical History:  Diagnosis Date   Cholestasis during pregnancy    Migraine    UTI (urinary tract infection)     Past Surgical History:  Procedure Laterality Date   WISDOM TOOTH EXTRACTION      Family History  Problem Relation Age of Onset   Healthy Mother    Healthy Father     Social History Social History   Tobacco Use   Smoking status: Never   Smokeless tobacco: Never  Vaping Use   Vaping Use: Former  Substance Use Topics   Alcohol use: No    Alcohol/week: 0.0 standard drinks of alcohol   Drug use: No    Allergies  Allergen Reactions   Rocephin [Ceftriaxone] Anaphylaxis    See MAU note from 07/25/2021. Hives on face, anaphylaxis reaction with drop in O2 saturation.    Other     Seasonal Allergies   Shellfish Allergy     No current outpatient medications on file.   No current facility-administered medications for this visit.    Review of Systems Review of Systems  Constitutional: Negative.   Respiratory: Negative.    Cardiovascular: Negative.   Gastrointestinal: Negative.   Genitourinary:  Positive for dyspareunia and pelvic pain. Negative for vaginal bleeding and vaginal discharge.    Blood pressure 104/68, pulse 65, weight 128 lb (58.1 kg), currently breastfeeding.  Physical Exam Physical Exam Vitals and nursing note reviewed. Exam conducted with a chaperone present.  Constitutional:      Appearance: Normal appearance.  Cardiovascular:     Rate and Rhythm: Normal rate.  Pulmonary:     Effort: Pulmonary effort is normal.  Abdominal:     General: Abdomen is flat.      Palpations: Abdomen is soft.  Neurological:     Mental Status: She is alert.  Psychiatric:        Mood and Affect: Mood normal.        Behavior: Behavior normal.     Data Reviewed   Assessment Pelvic pain for 3 weeks with benign exam and sx improving  Plan RTC for routine f/u  and report if her sx do not improve Orders Placed This Encounter  Procedures   Urine Culture   POCT urinalysis dipstick       Scheryl Darter 09/11/2022, 2:25 PM

## 2022-09-11 NOTE — Progress Notes (Signed)
Pt is having some lower left pelvic pain x 3 weeks.  Pt complains of pain with urination and with sexual intercourse.  Pt denies constipation.  Pt currently has Nexplanon, states she will have some pain with it occasionally.  Pt may want to come back for AEX/pap.

## 2022-09-12 LAB — CERVICOVAGINAL ANCILLARY ONLY
Bacterial Vaginitis (gardnerella): POSITIVE — AB
Candida Glabrata: NEGATIVE
Candida Vaginitis: POSITIVE — AB
Chlamydia: NEGATIVE
Comment: NEGATIVE
Comment: NEGATIVE
Comment: NEGATIVE
Comment: NEGATIVE
Comment: NEGATIVE
Comment: NORMAL
Neisseria Gonorrhea: NEGATIVE
Trichomonas: NEGATIVE

## 2022-09-13 LAB — URINE CULTURE: Organism ID, Bacteria: NO GROWTH

## 2022-09-14 MED ORDER — FLUCONAZOLE 150 MG PO TABS: 150.0000 mg | ORAL_TABLET | Freq: Once | ORAL | 0 refills | Status: AC

## 2022-09-14 MED ORDER — METRONIDAZOLE 500 MG PO TABS: 500.0000 mg | ORAL_TABLET | Freq: Two times a day (BID) | ORAL | 0 refills | Status: DC

## 2023-02-12 ENCOUNTER — Encounter: Payer: Self-pay | Admitting: Obstetrics and Gynecology

## 2023-02-12 ENCOUNTER — Ambulatory Visit (INDEPENDENT_AMBULATORY_CARE_PROVIDER_SITE_OTHER): Payer: Medicaid Other | Admitting: Obstetrics and Gynecology

## 2023-02-12 VITALS — BP 113/59 | HR 74 | Ht 60.0 in | Wt 136.8 lb

## 2023-02-12 DIAGNOSIS — Z3046 Encounter for surveillance of implantable subdermal contraceptive: Secondary | ICD-10-CM

## 2023-02-12 DIAGNOSIS — N921 Excessive and frequent menstruation with irregular cycle: Secondary | ICD-10-CM | POA: Diagnosis not present

## 2023-02-12 DIAGNOSIS — Z3009 Encounter for other general counseling and advice on contraception: Secondary | ICD-10-CM

## 2023-02-12 MED ORDER — SLYND 4 MG PO TABS: ORAL_TABLET | ORAL | 4 refills | Status: DC

## 2023-02-12 NOTE — Progress Notes (Addendum)
     GYNECOLOGY OFFICE PROCEDURE NOTE  Molly Rose is a 22 y.o. G1P1001 here for Nexplanon removal.  Last pap smear was on n/a has not had one.No other gynecologic concerns.   Nexplanon Removal Patient identified, informed consent performed, consent signed.   Appropriate time out taken.   Nexplanon site identified.  Area prepped in usual sterile fashon. One ml of 1% lidocaine was used to anesthetize the area at the distal end of the implant. A small stab incision was made right beside the implant on the distal portion.  The Nexplanon rod was grasped using hemostats and removed without difficulty.  There was minimal blood loss. There were no complications.  3 ml of 1% lidocaine was injected around the incision for post-procedure analgesia.  Steri-strips were applied over the small incision.  A pressure bandage was applied to reduce any bruising.    The patient tolerated the procedure well and was given post procedure instructions.  Patient is planning to use pills for birth control. Slynd samples given. Discussed starting pills, back up method, missed pill window  Return in 1-3 months for annual well woman exam with pap  Albertine Grates, FNP [facility] [employer], [facility]

## 2023-02-12 NOTE — Progress Notes (Signed)
Pt presents for Nexplanon removal. Nexplanon placed 1 year ago. Pt c/o pain at site and irregular periods. Pt interested in pills.

## 2023-05-12 ENCOUNTER — Inpatient Hospital Stay (HOSPITAL_COMMUNITY)
Admission: AD | Admit: 2023-05-12 | Discharge: 2023-05-12 | Disposition: A | Discharge status: Home / Self Care | Payer: Medicaid Other | Attending: Obstetrics & Gynecology | Admitting: Obstetrics & Gynecology

## 2023-05-12 DIAGNOSIS — Z3202 Encounter for pregnancy test, result negative: Secondary | ICD-10-CM | POA: Insufficient documentation

## 2023-05-12 DIAGNOSIS — N3000 Acute cystitis without hematuria: Secondary | ICD-10-CM

## 2023-05-12 DIAGNOSIS — N390 Urinary tract infection, site not specified: Secondary | ICD-10-CM | POA: Insufficient documentation

## 2023-05-12 DIAGNOSIS — R109 Unspecified abdominal pain: Secondary | ICD-10-CM

## 2023-05-12 DIAGNOSIS — R300 Dysuria: Secondary | ICD-10-CM

## 2023-05-12 LAB — HCG, QUANTITATIVE, PREGNANCY: hCG, Beta Chain, Quant, S: 1 m[IU]/mL (ref <5)

## 2023-05-12 LAB — POCT PREGNANCY, URINE: Preg Test, Ur: NEGATIVE

## 2023-05-12 MED ORDER — NITROFURANTOIN MONOHYD MACRO 100 MG PO CAPS: 100.0000 mg | ORAL_CAPSULE | Freq: Two times a day (BID) | ORAL | 0 refills | Status: DC

## 2023-05-12 NOTE — MAU Provider Note (Signed)
None     S [name] is a 22 y.o. G1P1001 patient who presents to MAU today with complaint of R flank pain and positive pregnancy test at home.   Reports one positive pregnancy test at home. Does not desire to be pregnant. Has also been having intermittent discomfort with urination and R flank pain. During her last pregnancy she had similar symptoms and had a UTI that required antibiotics. No fevers.   O BP (!) 102/54   Pulse 66   Temp 98.3 F (36.8 C) (Oral)   Resp 16   Ht 5' (1.524 m)   Wt 64.5 kg   SpO2 100%   BMI 27.77 kg/m  Physical Exam Vitals reviewed.  Constitutional:      General: She is not in acute distress.    Appearance: She is well-developed. She is not diaphoretic.  Eyes:     General: No scleral icterus. Pulmonary:     Effort: Pulmonary effort is normal. No respiratory distress.  Skin:    General: Skin is warm and dry.  Neurological:     Mental Status: She is alert.     Coordination: Coordination normal.    Results for orders placed or performed during the hospital encounter of 05/12/23 (from the past 24 hours)  Pregnancy, urine POC     Status: None   Collection Time: 05/12/23  2:57 PM  Result Value Ref Range   Preg Test, Ur NEGATIVE NEGATIVE  hCG, quantitative, pregnancy     Status: None   Collection Time: 05/12/23  3:13 PM  Result Value Ref Range   hCG, Beta Chain, Quant, S 1 <5 mIU/mL     A Medical screening exam complete Not pregnant Simple UTI  P  Discussed we can only treat pregnant patients, labs have resulted with hcg 1. Symptoms c/w UTI, will treat empirically and send urine for culture. She has appt with OB office next week to follow up. Offered transfer to ED, declined.   Molly Maples, MD 05/12/2023 5:27 PM

## 2023-05-12 NOTE — MAU Note (Signed)
..  Molly Rose is a 22 y.o. at Unknown here in MAU reporting: right sided back/flank pain that started 2 weeks ago and has been worse last week. Patient took a pregnancy test at home on Saturday that was positive. Pt also reports headaches and increased bloating over the last couple of weeks.  LMP: 04/16/23 Pain score: 7/10 Vitals:   05/12/23 1504  BP: (!) 109/51  Pulse: 62  Resp: 16  Temp: 98.3 F (36.8 C)  SpO2: 100%     FHT: Lab orders placed from triage:  UPT, HCG

## 2023-05-19 ENCOUNTER — Ambulatory Visit: Payer: Medicaid Other | Admitting: Obstetrics and Gynecology

## 2023-10-28 ENCOUNTER — Encounter

## 2023-10-28 ENCOUNTER — Ambulatory Visit: Admitting: Obstetrics and Gynecology

## 2023-11-12 ENCOUNTER — Ambulatory Visit: Admitting: Obstetrics

## 2023-11-27 ENCOUNTER — Ambulatory Visit: Admitting: Physician Assistant

## 2023-11-27 ENCOUNTER — Encounter: Payer: Self-pay | Admitting: Physician Assistant

## 2023-11-27 VITALS — BP 113/76 | HR 86 | Ht 60.0 in | Wt 154.6 lb

## 2023-11-27 DIAGNOSIS — T148XXA Other injury of unspecified body region, initial encounter: Secondary | ICD-10-CM

## 2023-11-27 DIAGNOSIS — S301XXA Contusion of abdominal wall, initial encounter: Secondary | ICD-10-CM | POA: Diagnosis not present

## 2023-11-27 DIAGNOSIS — Z124 Encounter for screening for malignant neoplasm of cervix: Secondary | ICD-10-CM

## 2023-11-27 NOTE — Progress Notes (Signed)
 GYNECOLOGY  VISIT   HPI: Molly Rose is a 23 y.o.  significant other female G1P1001 here for bruising in R lower abdomenopelvic region that she noticed at beginning of last week when she was about to go shower. Patient did have pulsating pain there with palpation. Now the bruising has mostly faded. She denies known trauma to the area. No known family history of coagulopathies, bleeding disorders though her periods are heavy. She denies extended bleeding time if she gets a cut in her skin. She denies itching, burning, and irritation of the groin area, dysuria, abnormal discharge, malodor.    GYNECOLOGIC HISTORY: Patient's last menstrual period was 10/30/2023 (exact date). 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 14 capsule 0   No current facility-administered medications for this visit.     ALLERGIES: Rocephin  [ceftriaxone ], Other, and Shellfish allergy  Family History  Problem Relation Age of Onset   Healthy Mother    Healthy Father     Social History    Socioeconomic History   Marital status: Significant Other    Spouse name: Not on file   Number of children: Not on file   Years of education: Not on file   Highest education level: Not on file  Occupational History   Not on file  Tobacco Use   Smoking status: Never   Smokeless tobacco: Never  Vaping Use   Vaping status: Former  Substance and Sexual Activity   Alcohol use: No    Alcohol/week: 0.0 standard drinks of alcohol   Drug use: No   Sexual activity: Yes    Birth control/protection: None  Other Topics Concern   Not on file  Social History Narrative   Not on file   Social Drivers of Health   Financial Resource Strain: Not on file  Food Insecurity: Not on file  Transportation Needs: Not on file  Physical Activity: Not on file  Stress: Not on file  Social Connections: Unknown (09/24/2021)   Received from [facility]   Social Network    Social Network: Not on file  Intimate Partner Violence: Unknown (08/31/2021)   Received from [facility]   HITS    Physically Hurt: Not on file    Insult or Talk Down To: Not on file  Threaten Physical Harm: Not on file    Scream or Curse: Not on file    Review of Systems  PHYSICAL EXAMINATION:    BP 113/76   Pulse 86   Ht 5' (1.524 m)   Wt 154 lb 9.6 oz (70.1 kg)   LMP 10/30/2023 (Exact Date)   Breastfeeding Yes   BMI 30.19 kg/m     General appearance: alert, cooperative and appears stated age Head: Normocephalic, without obvious abnormality, atraumatic Neck: no adenopathy, supple, symmetrical, trachea midline and thyroid normal to inspection and palpation Lungs: clear to auscultation bilaterally Heart: regular rate and rhythm Abdomen: soft, non-tender, no masses,  no organomegaly Extremities: extremities normal, atraumatic, no cyanosis or edema Skin: +1 cm bruise of R glute/hip. Skin color, texture, turgor otherwise normal.  Lymph nodes: No abnormal inguinal nodes palpated Neurologic: Grossly normal  Pelvic:  External genitalia:  no lesions              Urethra:  normal appearing urethra with no masses, tenderness or lesions              Bartholins and Skenes: normal                 Vagina: normal appearing vagina with normal color and discharge, no lesions              Cervix: no lesions                Chaperone was present for exam  ASSESSMENT & PLAN   1. Bruise (Primary) Patient with bruising in abdominopelvic area x1 week. No visible lesions on exam with exception of 1 cm bruise of R glute/hip. Patient did show me a photo on her phone of .5 cm bruises x 4 in the R abdominopelvic area, now cleared and soreness improved. Likely related to trauma to the skin. Low concern for coagulopathy. No further intervention necessary at this time, but patient instructed to return if worsening.   2. Cervical cancer screening - Cytology - PAP( [place])   An After Visit Summary was printed and given to the patient.   Joanie Duprey E Quintus Premo, PA-C 7/3/20254:43 PM

## 2023-11-27 NOTE — Progress Notes (Signed)
 Pt c/o bruising to upper groin area for about 1 week. No injury to area. States that it has gradually improved since onset. Pt has not tried any treatment modalities at home. She would like to discuss how to wean from breast feeding.

## 2023-11-27 NOTE — Patient Instructions (Signed)
 What is weaning?  This is when you stop breastfeeding your baby. After babies are weaned, they no longer drink breast milk.  How long should I breastfeed before weaning?  Most doctors recommend breastfeeding your baby for at least 1 year (12 months). But you can continue to breastfeed for longer if you and your baby want to.  For the first 6 months of life, breast milk is the only food babies need. Most babies start eating other foods (in addition to breast milk) when they are 4 to 20 months old. These foods include infant cereal and mashed up vegetables, fruits, and meats.  Doctors recommend waiting until your baby is at least 43 year old before switching from breast milk or formula to cow's milk or another type of milk. If needed, you might be able to switch your baby to cow's milk after they are 8 or 31 months old. If you do this, give them plenty of solid foods that are high in iron, like meats and iron-fortified baby cereals.  When should I wean?  People choose to stop breastfeeding at different times and for different reasons. Usually, you can choose when to wean. But sometimes, weaning happens because a baby no longer wants to breastfeed.  Some babies wean quickly. Others can take months to wean.  What if I want to stop breastfeeding but keep giving my baby breast milk?  You can use a breast pump. This lets you remove milk from your breasts so your baby can drink it from a bottle.  You might choose to start pumping if you return to work after having your baby. You might also pump if you need to be away from your baby for another reason, or just so another person can help with feedings. Pumping is a great way to keep giving your baby breast milk if you are not yet ready to wean.  How do I wean?  When you decide to wean, do not stop breastfeeding all at once. Instead, try to reduce your breastfeeding slowly.  To do this, you can:  ?Drop 1 breastfeeding session every 2 to 5  days.  ?Shorten each breastfeeding session.  ?Increase the time between breastfeeding sessions.  Some people start to wean by stopping the daytime feedings first. They might still breastfeed at night or before bedtime. The night or bedtime feedings are usually the last feedings to be stopped.  Should I give my baby a bottle or cup when I wean?  You can. Most babies younger than 62 months old are weaned to a bottle. Most babies older than 1 year are weaned to a cup. Babies between 6 months and 72 year old can be weaned to a bottle or a cup.  To help your baby's first bottle or cup feedings go well, you can:  ?Have someone else give your baby the bottle or cup.  ?Give the bottle or cup before your baby gets too hungry.  ?Put breast milk in the bottle or cup.  ?Use a cup with 2 handles and a snap-on lid (if you use a cup).  What breast problems can happen with weaning?  Different breast problems can happen with weaning. These include:  ?Engorgement, which is when the breasts become too full of milk - This can cause the breasts to be swollen, hard, warm, and painful.  ?Blocked milk duct - This can cause a red and painful breast lump (picture 1).  ?Breast infection - This can cause a fever and a  hard, red, and swollen area of the breast.  These problems are especially likely to happen if you stop breastfeeding all at once. If you need to wean all at once, there are things you can do to prevent these problems. For example, you can use a breast pump or your hand to release some milk from your breasts (figure 1). You can do this a few times a day for a few days until your breasts stop hurting. If you do use a pump, use it for just a few minutes at a time. If you pump for longer, your body will continue to make more milk.  Breast problems from weaning are treated in different ways, depending on the problem. If you have any of the above symptoms or problems, tell your doctor or nurse.  How  might my breasts change after weaning?  Your breasts might feel emptier and get smaller after weaning. You might also get stretch marks on your breasts. But they usually fade over time.  After you stop breastfeeding, your breasts will stop making milk. But it can be normal to still have some milk in your breasts for months or years after weaning.  What if I feel sad or guilty when I wean?  It is normal to feel sad or guilty when you wean. Try to be gentle with yourself. No matter when you wean, remember you have given your baby many important benefits through breastfeeding.  Weaning can also be hard for babies. It can help to give your baby extra love and attention during this time.

## 2023-12-01 ENCOUNTER — Other Ambulatory Visit

## 2023-12-02 ENCOUNTER — Emergency Department (HOSPITAL_COMMUNITY)

## 2023-12-02 ENCOUNTER — Encounter (HOSPITAL_COMMUNITY): Payer: Self-pay

## 2023-12-02 ENCOUNTER — Emergency Department (HOSPITAL_COMMUNITY)
Admission: EM | Admit: 2023-12-02 | Discharge: 2023-12-03 | Disposition: A | Discharge status: Home / Self Care | Attending: Emergency Medicine | Admitting: Emergency Medicine

## 2023-12-02 ENCOUNTER — Other Ambulatory Visit: Payer: Self-pay

## 2023-12-02 DIAGNOSIS — R110 Nausea: Secondary | ICD-10-CM | POA: Diagnosis not present

## 2023-12-02 DIAGNOSIS — R079 Chest pain, unspecified: Secondary | ICD-10-CM

## 2023-12-02 DIAGNOSIS — R0789 Other chest pain: Secondary | ICD-10-CM | POA: Insufficient documentation

## 2023-12-02 LAB — BASIC METABOLIC PANEL WITH GFR
Anion gap: 13 (ref 5–15)
BUN: 14 mg/dL (ref 6–20)
CO2: 25 mmol/L (ref 22–32)
Calcium: 10 mg/dL (ref 8.9–10.3)
Chloride: 104 mmol/L (ref 98–111)
Creatinine, Ser: 0.48 mg/dL (ref 0.44–1.00)
GFR, Estimated: >60 mL/min (ref >60)
Glucose, Bld: 102 mg/dL — ABNORMAL HIGH (ref 70–99)
Potassium: 4 mmol/L (ref 3.5–5.1)
Sodium: 142 mmol/L (ref 135–145)

## 2023-12-02 LAB — HCG, SERUM, QUALITATIVE: Preg, Serum: NEGATIVE

## 2023-12-02 LAB — HEPATIC FUNCTION PANEL
ALT: 16 U/L (ref 0–44)
AST: 21 U/L (ref 15–41)
Albumin: 4.6 g/dL (ref 3.5–5.0)
Alkaline Phosphatase: 60 U/L (ref 38–126)
Bilirubin, Direct: <0.1 mg/dL (ref 0.0–0.2)
Total Bilirubin: 0.6 mg/dL (ref 0.0–1.2)
Total Protein: 7.7 g/dL (ref 6.5–8.1)

## 2023-12-02 LAB — CBC
HCT: 42.2 % (ref 36.0–46.0)
Hemoglobin: 13.6 g/dL (ref 12.0–15.0)
MCH: 30.5 pg (ref 26.0–34.0)
MCHC: 32.2 g/dL (ref 30.0–36.0)
MCV: 94.6 fL (ref 80.0–100.0)
Platelets: 300 K/uL (ref 150–400)
RBC: 4.46 MIL/uL (ref 3.87–5.11)
RDW: 12.3 % (ref 11.5–15.5)
WBC: 8.2 K/uL (ref 4.0–10.5)
nRBC: 0 % (ref 0.0–0.2)

## 2023-12-02 LAB — TROPONIN I (HIGH SENSITIVITY): Troponin I (High Sensitivity): <2 ng/L (ref <18)

## 2023-12-02 LAB — LIPASE, BLOOD: Lipase: 30 U/L (ref 11–51)

## 2023-12-02 MED ORDER — ALUM & MAG HYDROXIDE-SIMETH 200-200-20 MG/5ML PO SUSP
30.0000 mL | Freq: Once | ORAL | Status: AC
  Administered 2023-12-02: 30 mL via ORAL
  Filled 2023-12-02: qty 30

## 2023-12-02 NOTE — ED Triage Notes (Signed)
 Pt complaining of chest pain that started 2 weeks ago in the center of the chest. Sharp in nature and it reoccurs after eating.

## 2023-12-02 NOTE — Discharge Instructions (Signed)
 Please start Protonix .  Alternate Tums for breakthrough symptoms.  Try and avoid triggers.  Follow-up with primary care

## 2023-12-02 NOTE — ED Provider Notes (Signed)
 [facility] AT [facility] Provider Note   CSN: [id number] Arrival date & time: 12/02/23  1931     Patient presents with: Chest Pain   Molly Rose is a 23 y.o. female.  Patient with past medical history of complicated migraines and cholestasis during pregnancy presents to emergency room with complaint of chest pain.  Patient reports that chest pain has been intermittent for the past 2 weeks.  This occurs after eating and resolves approximately 1 to 2 hours after.  She describes as sharp and burning rating up her chest.  He has nausea but no vomiting or diarrhea.  Denies abdominal pain or fever.  She is tolerating oral intake.  Has not tried anything for symptoms.  No recent travel no swelling in calfs or pain.   {Add pertinent medical, surgical, social history, OB history to HPI:32947}  Chest Pain      Prior to Admission medications   Medication Sig Start Date End Date Taking? Authorizing Provider  nitrofurantoin , macrocrystal-monohydrate, (MACROBID ) 100 MG capsule Take 1 capsule (100 mg total) by mouth 2 (two) times daily. 05/12/23   Lola Donnice HERO, MD    Allergies: Rocephin  [ceftriaxone ], Other, and Shellfish allergy    Review of Systems  Cardiovascular:  Positive for chest pain.    Updated Vital Signs BP 119/65 (BP Location: Left Arm)   Pulse 63   Temp 98.5 F (36.9 C)   Resp 17   Ht 5' (1.524 m)   Wt 70.8 kg   LMP 10/30/2023 (Exact Date)   SpO2 100%   BMI 30.47 kg/m   Physical Exam Vitals and nursing note reviewed.  Constitutional:      General: She is not in acute distress.    Appearance: She is not toxic-appearing.  HENT:     Head: Normocephalic and atraumatic.  Eyes:     General: No scleral icterus.    Conjunctiva/sclera: Conjunctivae normal.  Cardiovascular:     Rate and Rhythm: Normal rate and regular rhythm.     Pulses: Normal pulses.     Heart sounds: Normal heart sounds.  Pulmonary:     Effort: Pulmonary  effort is normal. No respiratory distress.     Breath sounds: Normal breath sounds.  Chest:     Chest wall: Tenderness present.  Abdominal:     General: Abdomen is flat. Bowel sounds are normal.     Palpations: Abdomen is soft.     Tenderness: There is no abdominal tenderness.  Musculoskeletal:     Right lower leg: No edema.     Left lower leg: No edema.  Skin:    General: Skin is warm and dry.     Findings: No lesion.  Neurological:     General: No focal deficit present.     Mental Status: She is alert and oriented to person, place, and time. Mental status is at baseline.     (all labs ordered are listed, but only abnormal results are displayed) Labs Reviewed  BASIC METABOLIC PANEL WITH GFR - Abnormal; Notable for the following components:      Result Value   Glucose, Bld 102 (*)    All other components within normal limits  CBC  HCG, SERUM, QUALITATIVE  HEPATIC FUNCTION PANEL  LIPASE, BLOOD  TROPONIN I (HIGH SENSITIVITY)    EKG: None  Radiology: DG Chest 2 View Result Date: 12/02/2023 CLINICAL DATA:  Chest pain starting 2 weeks ago. Central sharp chest pain. Pain recurs after eating. EXAM:  CHEST - 2 VIEW COMPARISON:  None Available. FINDINGS: Shallow inspiration. Heart size and pulmonary vascularity are normal. Lungs are clear. No pleural effusion or pneumothorax. Mediastinal contours appear intact. IMPRESSION: No active cardiopulmonary disease. Electronically Signed   By: Elsie Gravely M.D.   On: 12/02/2023 20:07    {Document cardiac monitor, telemetry assessment procedure when appropriate:32947} Procedures   Medications Ordered in the ED  alum & mag hydroxide-simeth (MAALOX/MYLANTA) 200-200-20 MG/5ML suspension 30 mL (30 mLs Oral Given 12/02/23 2246)      {Click here for ABCD2, HEART and other calculators REFRESH Note before signing:1}                              Medical Decision Making Amount and/or Complexity of Data Reviewed Labs: ordered. Radiology:  ordered.  Risk OTC drugs.   This patient presents to the ED for concern of chest pain, this involves an extensive number of treatment options, and is a complaint that carries with it a high risk of complications and morbidity.  The differential diagnosis includes ACS, PE, pneumothorax, pneumonia, CHF   Co morbidities that complicate the patient evaluation  Stroke complex migraine   Additional history obtained:  Additional history obtained from ***   Lab Tests:  I personally interpreted labs.  The pertinent results include:  ***   Imaging Studies ordered:  I ordered imaging studies including chest x-ray I independently visualized and interpreted imaging which showed no acute findings I agree with the radiologist interpretation   Cardiac Monitoring: / EKG:  The patient was maintained on a cardiac monitor.  I personally viewed and interpreted the cardiac monitored which showed an underlying rhythm of: Sinus    Problem List / ED Course / Critical interventions / Medication management  *** I ordered medication including GI cocktail Reevaluation of the patient after these medicines showed that the patient improved I have reviewed the patients home medicines and have made adjustments as needed   Plan  F/u w/ PCP in 2-3d to ensure resolution of sx.  Patient was given return precautions. Patient stable for discharge at this time.  Patient educated on sx/dx and verbalized understanding of plan. Return to ER w/ new or worsening sx.    {Document critical care time when appropriate  Document review of labs and clinical decision tools ie CHADS2VASC2, etc  Document your independent review of radiology images and any outside records  Document your discussion with family members, caretakers and with consultants  Document social determinants of health affecting pt's care  Document your decision making why or why not admission, treatments were needed:32947:::1}   Final  diagnoses:  None    ED Discharge Orders     None

## 2023-12-03 ENCOUNTER — Other Ambulatory Visit (HOSPITAL_COMMUNITY): Payer: Self-pay

## 2023-12-03 MED ORDER — PANTOPRAZOLE SODIUM 20 MG PO TBEC
20.0000 mg | DELAYED_RELEASE_TABLET | Freq: Every day | ORAL | 1 refills | Status: DC
  Filled 2023-12-03 – 2023-12-05 (×2): qty 30, 30d supply, fill #0

## 2023-12-05 ENCOUNTER — Other Ambulatory Visit (HOSPITAL_COMMUNITY): Payer: Self-pay

## 2023-12-09 LAB — CYTOLOGY - PAP: Adequacy: ABNORMAL

## 2023-12-17 ENCOUNTER — Ambulatory Visit: Payer: Self-pay | Admitting: *Deleted

## 2023-12-18 ENCOUNTER — Inpatient Hospital Stay (HOSPITAL_COMMUNITY)

## 2023-12-18 ENCOUNTER — Inpatient Hospital Stay (HOSPITAL_COMMUNITY)
Admission: AD | Admit: 2023-12-18 | Discharge: 2023-12-18 | Disposition: A | Discharge status: Home / Self Care | Attending: Obstetrics & Gynecology | Admitting: Obstetrics & Gynecology

## 2023-12-18 ENCOUNTER — Encounter (HOSPITAL_COMMUNITY): Payer: Self-pay | Admitting: Obstetrics & Gynecology

## 2023-12-18 ENCOUNTER — Other Ambulatory Visit: Payer: Self-pay

## 2023-12-18 DIAGNOSIS — O23591 Infection of other part of genital tract in pregnancy, first trimester: Secondary | ICD-10-CM | POA: Diagnosis not present

## 2023-12-18 DIAGNOSIS — B9689 Other specified bacterial agents as the cause of diseases classified elsewhere: Secondary | ICD-10-CM | POA: Diagnosis not present

## 2023-12-18 DIAGNOSIS — R109 Unspecified abdominal pain: Secondary | ICD-10-CM | POA: Diagnosis not present

## 2023-12-18 DIAGNOSIS — O3680X0 Pregnancy with inconclusive fetal viability, not applicable or unspecified: Secondary | ICD-10-CM | POA: Diagnosis not present

## 2023-12-18 DIAGNOSIS — Z3A01 Less than 8 weeks gestation of pregnancy: Secondary | ICD-10-CM

## 2023-12-18 DIAGNOSIS — O26891 Other specified pregnancy related conditions, first trimester: Secondary | ICD-10-CM

## 2023-12-18 LAB — COMPREHENSIVE METABOLIC PANEL WITH GFR
ALT: 24 U/L (ref 0–44)
AST: 22 U/L (ref 15–41)
Albumin: 4 g/dL (ref 3.5–5.0)
Alkaline Phosphatase: 50 U/L (ref 38–126)
Anion gap: 8 (ref 5–15)
BUN: 9 mg/dL (ref 6–20)
CO2: 21 mmol/L — ABNORMAL LOW (ref 22–32)
Calcium: 8.6 mg/dL — ABNORMAL LOW (ref 8.9–10.3)
Chloride: 105 mmol/L (ref 98–111)
Creatinine, Ser: 0.71 mg/dL (ref 0.44–1.00)
GFR, Estimated: >60 mL/min (ref >60)
Glucose, Bld: 92 mg/dL (ref 70–99)
Potassium: 3.9 mmol/L (ref 3.5–5.1)
Sodium: 134 mmol/L — ABNORMAL LOW (ref 135–145)
Total Bilirubin: 0.8 mg/dL (ref 0.0–1.2)
Total Protein: 6.7 g/dL (ref 6.5–8.1)

## 2023-12-18 LAB — URINALYSIS, ROUTINE W REFLEX MICROSCOPIC
Bilirubin Urine: NEGATIVE
Glucose, UA: NEGATIVE mg/dL
Hgb urine dipstick: NEGATIVE
Ketones, ur: NEGATIVE mg/dL
Nitrite: NEGATIVE
Protein, ur: NEGATIVE mg/dL
Specific Gravity, Urine: 1.02 (ref 1.005–1.030)
pH: 8 (ref 5.0–8.0)

## 2023-12-18 LAB — CBC WITH DIFFERENTIAL/PLATELET
Abs Immature Granulocytes: 0.02 K/uL (ref 0.00–0.07)
Basophils Absolute: 0.1 K/uL (ref 0.0–0.1)
Basophils Relative: 1 %
Eosinophils Absolute: 0.1 K/uL (ref 0.0–0.5)
Eosinophils Relative: 1 %
HCT: 36.4 % (ref 36.0–46.0)
Hemoglobin: 12.3 g/dL (ref 12.0–15.0)
Immature Granulocytes: 0 %
Lymphocytes Relative: 26 %
Lymphs Abs: 2.4 K/uL (ref 0.7–4.0)
MCH: 31 pg (ref 26.0–34.0)
MCHC: 33.8 g/dL (ref 30.0–36.0)
MCV: 91.7 fL (ref 80.0–100.0)
Monocytes Absolute: 0.7 K/uL (ref 0.1–1.0)
Monocytes Relative: 8 %
Neutro Abs: 6 K/uL (ref 1.7–7.7)
Neutrophils Relative %: 64 %
Platelets: 262 K/uL (ref 150–400)
RBC: 3.97 MIL/uL (ref 3.87–5.11)
RDW: 12.2 % (ref 11.5–15.5)
WBC: 9.3 K/uL (ref 4.0–10.5)
nRBC: 0 % (ref 0.0–0.2)

## 2023-12-18 LAB — POCT PREGNANCY, URINE: Preg Test, Ur: POSITIVE — AB

## 2023-12-18 LAB — WET PREP, GENITAL
Sperm: NONE SEEN
Trich, Wet Prep: NONE SEEN
WBC, Wet Prep HPF POC: <10 (ref <10)
Yeast Wet Prep HPF POC: NONE SEEN

## 2023-12-18 LAB — HCG, QUANTITATIVE, PREGNANCY: hCG, Beta Chain, Quant, S: 123 m[IU]/mL — ABNORMAL HIGH (ref <5)

## 2023-12-18 LAB — HIV ANTIBODY (ROUTINE TESTING W REFLEX): HIV Screen 4th Generation wRfx: NONREACTIVE

## 2023-12-18 MED ORDER — METRONIDAZOLE 500 MG PO TABS
500.0000 mg | ORAL_TABLET | Freq: Two times a day (BID) | ORAL | 0 refills | Status: DC
  Filled 2023-12-18: qty 14, 7d supply, fill #0

## 2023-12-18 NOTE — MAU Note (Signed)
 Molly Rose is a 23 y.o. at Unknown here in MAU reporting: she had a +HPT 2-3 days ago, but today began having abdominal cramping and spotting.    LMP: 11/17/2023 Onset of complaint: today Pain score: 6 Vitals:   12/18/23 1758  BP: (!) 110/57  Pulse: 65  Resp: 18  Temp: 98.2 F (36.8 C)  SpO2: 99%     FHT: NA  Lab orders placed from triage: UPT

## 2023-12-18 NOTE — MAU Provider Note (Signed)
 History     CSN: [id number]  Arrival date and time: 12/18/23 1619   None     Chief Complaint  Patient presents with   Abdominal Pain   Vaginal Bleeding   HPI  [name] is a 23 y.o. female G2P1001 @ [redacted]w[redacted]d here in MAU with complaints of lower abdominal pain and vaginal bleeding. Symptoms started this morning. She reports vaginal bleeding that was similar to a period and then stopped. She has no bleeding right now. She reports off and on abdominal pain.   OB History     Gravida  2   Para  1   Term  1   Preterm      AB      Living  1      SAB      IAB      Ectopic      Multiple  0   Live Births  1           Past Medical History:  Diagnosis Date   Cholestasis during pregnancy    Migraine    UTI (urinary tract infection)     Past Surgical History:  Procedure Laterality Date   WISDOM TOOTH EXTRACTION      Family History  Problem Relation Age of Onset   Healthy Mother    Healthy Father     Social History   Tobacco Use   Smoking status: Never   Smokeless tobacco: Never  Vaping Use   Vaping status: Former  Substance Use Topics   Alcohol use: No    Alcohol/week: 0.0 standard drinks of alcohol   Drug use: No    Allergies:  Allergies  Allergen Reactions   Rocephin  [Ceftriaxone ] Anaphylaxis    See MAU note from 07/25/2021. Hives on face, anaphylaxis reaction with drop in O2 saturation.    Other     Seasonal Allergies   Shellfish Allergy     Medications Prior to Admission  Medication Sig Dispense Refill Last Dose/Taking   nitrofurantoin , macrocrystal-monohydrate, (MACROBID ) 100 MG capsule Take 1 capsule (100 mg total) by mouth 2 (two) times daily. 14 capsule 0    pantoprazole  (PROTONIX ) 20 MG tablet Take 1 tablet (20 mg total) by mouth daily. 30 tablet 1    Results for orders placed or performed during the hospital encounter of 12/18/23 (from the past 48 hours)  Pregnancy, urine POC     Status: Abnormal   Collection Time:  12/18/23  6:05 PM  Result Value Ref Range   Preg Test, Ur POSITIVE (A) NEGATIVE    Comment:        THE SENSITIVITY OF THIS METHODOLOGY IS >20 mIU/mL.   Urinalysis, Routine w reflex microscopic -Urine, Clean Catch     Status: Abnormal   Collection Time: 12/18/23  6:55 PM  Result Value Ref Range   Color, Urine YELLOW YELLOW   APPearance HAZY (A) CLEAR   Specific Gravity, Urine 1.020 1.005 - 1.030   pH 8.0 5.0 - 8.0   Glucose, UA NEGATIVE NEGATIVE mg/dL   Hgb urine dipstick NEGATIVE NEGATIVE   Bilirubin Urine NEGATIVE NEGATIVE   Ketones, ur NEGATIVE NEGATIVE mg/dL   Protein, ur NEGATIVE NEGATIVE mg/dL   Nitrite NEGATIVE NEGATIVE   Leukocytes,Ua TRACE (A) NEGATIVE   RBC / HPF 0-5 0 - 5 RBC/hpf   WBC, UA 0-5 0 - 5 WBC/hpf   Bacteria, UA RARE (A) NONE SEEN   Squamous Epithelial / HPF 11-20 0 - 5 /HPF  Mucus PRESENT     Comment: Performed at [facility], [address], [place], [place] [address]  CBC with Differential/Platelet     Status: None   Collection Time: 12/18/23  8:18 PM  Result Value Ref Range   WBC 9.3 4.0 - 10.5 K/uL   RBC 3.97 3.87 - 5.11 MIL/uL   Hemoglobin 12.3 12.0 - 15.0 g/dL   HCT 63.5 63.9 - 53.9 %   MCV 91.7 80.0 - 100.0 fL   MCH 31.0 26.0 - 34.0 pg   MCHC 33.8 30.0 - 36.0 g/dL   RDW 87.7 88.4 - 84.4 %   Platelets 262 150 - 400 K/uL   nRBC 0.0 0.0 - 0.2 %   Neutrophils Relative % 64 %   Neutro Abs 6.0 1.7 - 7.7 K/uL   Lymphocytes Relative 26 %   Lymphs Abs 2.4 0.7 - 4.0 K/uL   Monocytes Relative 8 %   Monocytes Absolute 0.7 0.1 - 1.0 K/uL   Eosinophils Relative 1 %   Eosinophils Absolute 0.1 0.0 - 0.5 K/uL   Basophils Relative 1 %   Basophils Absolute 0.1 0.0 - 0.1 K/uL   Immature Granulocytes 0 %   Abs Immature Granulocytes 0.02 0.00 - 0.07 K/uL    Comment: Performed at [facility], [address] [place], [place] [address]  Comprehensive metabolic panel with GFR     Status: Abnormal   Collection Time: 12/18/23  8:18 PM  Result  Value Ref Range   Sodium 134 (L) 135 - 145 mmol/L   Potassium 3.9 3.5 - 5.1 mmol/L   Chloride 105 98 - 111 mmol/L   CO2 21 (L) 22 - 32 mmol/L   Glucose, Bld 92 70 - 99 mg/dL    Comment: Glucose reference range applies only to samples taken after fasting for at least 8 hours.   BUN 9 6 - 20 mg/dL   Creatinine, Ser 9.28 0.44 - 1.00 mg/dL   Calcium  8.6 (L) 8.9 - 10.3 mg/dL   Total Protein 6.7 6.5 - 8.1 g/dL   Albumin 4.0 3.5 - 5.0 g/dL   AST 22 15 - 41 U/L   ALT 24 0 - 44 U/L   Alkaline Phosphatase 50 38 - 126 U/L   Total Bilirubin 0.8 0.0 - 1.2 mg/dL   GFR, Estimated >39 >39 mL/min    Comment: (NOTE) Calculated using the CKD-EPI Creatinine Equation (2021)    Anion gap 8 5 - 15    Comment: Performed at [facility], [address] [place], [place] [address]  hCG, quantitative, pregnancy     Status: Abnormal   Collection Time: 12/18/23  8:18 PM  Result Value Ref Range   hCG, Beta Chain, Quant, S 123 (H) <5 mIU/mL    Comment:          GEST. AGE      CONC.  (mIU/mL)   <=1 WEEK        5 - 50     2 WEEKS       50 - 500     3 WEEKS       100 - 10,000     4 WEEKS     1,000 - 30,000     5 WEEKS     3,500 - 115,000   6-8 WEEKS     12,000 - 270,000    12 WEEKS     15,000 - 220,000        FEMALE  AND NON-PREGNANT FEMALE:     LESS THAN 5 mIU/mL Performed at [facility], [address], [place], [place] [address]   HIV Antibody (routine testing w rflx)     Status: None   Collection Time: 12/18/23  8:18 PM  Result Value Ref Range   HIV Screen 4th Generation wRfx Non Reactive Non Reactive    Comment: Performed at [facility], [address], [place], [place] [address]  Wet prep, genital     Status: Abnormal   Collection Time: 12/18/23  8:22 PM   Specimen: PATH Cytology Cervicovaginal Ancillary Only  Result Value Ref Range   Yeast Wet Prep HPF POC NONE SEEN NONE SEEN   Trich, Wet Prep NONE SEEN NONE SEEN   Clue Cells Wet Prep HPF POC PRESENT (A) NONE SEEN   WBC, Wet  Prep HPF POC <10 <10   Sperm NONE SEEN     Comment: Performed at [facility], [address] [place], [place] [address]    Review of Systems  Constitutional:  Negative for fever.  Gastrointestinal:  Positive for abdominal pain.  Genitourinary:  Negative for vaginal bleeding and vaginal discharge.   Physical Exam   Blood pressure (!) 110/57, pulse 65, temperature 98.2 F (36.8 C), temperature source Oral, resp. rate 18, height 5' (1.524 m), weight 70.3 kg, last menstrual period 11/17/2023, SpO2 99%, currently breastfeeding.  Physical Exam Constitutional:      General: She is not in acute distress.    Appearance: She is well-developed. She is not ill-appearing, toxic-appearing or diaphoretic.  Abdominal:     Palpations: Abdomen is soft.  Skin:    General: Skin is warm.  Neurological:     Mental Status: She is alert and oriented to person, place, and time.  Psychiatric:        Behavior: Behavior normal.     MAU Course  Procedures None  MDM  Wet prep & GC HIV, CBC, Hcg, ABO US  OB transvaginal    Assessment and Plan   A:  1. Pregnancy of unknown anatomic location   2. Abdominal pain during pregnancy in first trimester   3. [redacted] weeks gestation of pregnancy   4. Bacterial vaginosis      P:  Dc home Return to MAU on Sunday morning for stat hcg level.  Rx: Flagyl  Ectopic precautions Pelvic rest  Esme Durkin, Delon FERNS, NP 12/18/2023 10:27 PM

## 2023-12-19 ENCOUNTER — Other Ambulatory Visit (HOSPITAL_COMMUNITY): Payer: Self-pay

## 2023-12-19 LAB — GC/CHLAMYDIA PROBE AMP (~~LOC~~) NOT AT ARMC
Chlamydia: NEGATIVE
Comment: NEGATIVE
Comment: NORMAL
Neisseria Gonorrhea: NEGATIVE

## 2023-12-23 ENCOUNTER — Ambulatory Visit: Admitting: Obstetrics & Gynecology

## 2023-12-23 ENCOUNTER — Other Ambulatory Visit (HOSPITAL_COMMUNITY)
Admission: RE | Admit: 2023-12-23 | Discharge: 2023-12-23 | Disposition: A | Discharge status: Home / Self Care | Source: Ambulatory Visit | Attending: Obstetrics & Gynecology | Admitting: Obstetrics & Gynecology

## 2023-12-23 VITALS — BP 109/70 | HR 66 | Wt 162.0 lb

## 2023-12-23 DIAGNOSIS — R87615 Unsatisfactory cytologic smear of cervix: Secondary | ICD-10-CM

## 2023-12-23 DIAGNOSIS — B9689 Other specified bacterial agents as the cause of diseases classified elsewhere: Secondary | ICD-10-CM | POA: Diagnosis not present

## 2023-12-23 DIAGNOSIS — N760 Acute vaginitis: Secondary | ICD-10-CM

## 2023-12-23 DIAGNOSIS — O3680X0 Pregnancy with inconclusive fetal viability, not applicable or unspecified: Secondary | ICD-10-CM

## 2023-12-23 MED ORDER — METRONIDAZOLE 500 MG PO TABS: 500.0000 mg | ORAL_TABLET | Freq: Two times a day (BID) | ORAL | 0 refills | Status: AC

## 2023-12-23 NOTE — Progress Notes (Signed)
 Patient ID: Molly Rose, female   DOB: 10-Sep-2000, 23 y.o.   MRN: [id number]   SUBJECTIVE HPI:  Molly Rose is a 23 y.o. G2P1001 at [redacted]w[redacted]d by LMP who presents to the [facility] for followup ultrasound results. The patient denies abdominal pain or vaginal bleeding.  Upon review of the patient's records, patient was first seen in MAU on 7/24 for pain and bleeding.   BHCG on that day was 123.  Ultrasound showed no IUP.  Pap 7/3 was not diagnostic and needs repeat   Past Medical History:  Diagnosis Date   Cholestasis during pregnancy    Migraine    UTI (urinary tract infection)    Past Surgical History:  Procedure Laterality Date   WISDOM TOOTH EXTRACTION     Social History   Socioeconomic History   Marital status: Significant Other    Spouse name: Not on file   Number of children: Not on file   Years of education: Not on file   Highest education level: Not on file  Occupational History   Not on file  Tobacco Use   Smoking status: Never   Smokeless tobacco: Never  Vaping Use   Vaping status: Former  Substance and Sexual Activity   Alcohol use: No    Alcohol/week: 0.0 standard drinks of alcohol   Drug use: No   Sexual activity: Yes    Birth control/protection: None  Other Topics Concern   Not on file  Social History Narrative   Not on file   Social Drivers of Health   Financial Resource Strain: Not on file  Food Insecurity: Not on file  Transportation Needs: Not on file  Physical Activity: Not on file  Stress: Not on file  Social Connections: Unknown (09/24/2021)   Received from [facility]   Social Network    Social Network: Not on file  Intimate Partner Violence: Unknown (08/31/2021)   Received from [facility]   HITS    Physically Hurt: Not on file    Insult or Talk Down To: Not on file    Threaten Physical Harm: Not on file    Scream or Curse: Not on file   No current outpatient medications on file prior to visit.   No  current facility-administered medications on file prior to visit.   Allergies  Allergen Reactions   Rocephin  [Ceftriaxone ] Anaphylaxis    See MAU note from 07/25/2021. Hives on face, anaphylaxis reaction with drop in O2 saturation.    Other     Seasonal Allergies   Shellfish Allergy     I have reviewed patient's Past Medical Hx, Surgical Hx, Family Hx, Social Hx, medications and allergies.   Review of Systems Review of Systems  Constitutional: Negative for fever and chills.  Gastrointestinal: Negative for nausea, vomiting, abdominal pain, diarrhea and constipation.  Genitourinary: Negative for dysuria.  Musculoskeletal: Negative for back pain.  Neurological: Negative for dizziness and weakness.    Physical Exam  BP 109/70   Pulse 66   Wt 162 lb (73.5 kg)   LMP 11/17/2023 (Approximate)   BMI 31.64 kg/m   GENERAL: Well-developed, well-nourished female in no acute distress.  HEENT: Normocephalic, atraumatic.   LUNGS: Effort normal ABDOMEN: soft, non-tender HEART: Regular rate  SKIN: Warm, dry and without erythema PSYCH: Normal mood and affect NEURO: Alert and oriented x 4 Pelvic exam: VULVA: normal appearing vulva with no masses, tenderness or lesions, VAGINA: normal appearing vagina with normal color and discharge, no lesions,  CERVIX: normal appearing cervix without discharge or lesions, UTERUS: uterus is normal size, shape, consistency and nontender, ADNEXA: normal adnexa in size, nontender and no masses.  LAB RESULTS No results found for this or any previous visit (from the past 24 hours).  IMAGING US  OB LESS THAN 14 WEEKS WITH OB TRANSVAGINAL Result Date: 12/18/2023 EXAM: ULTRASOUND FIRST TRIMESTER CLINICAL HISTORY: Vaginal bleeding affecting early pregnancy. TECHNIQUE: Transabdominal and transvaginal first trimester obstetric pelvic duplex ultrasound was performed with real-time imaging. COMPARISON: None provided. FINDINGS: UTERUS: No intrauterine pregnancy is visualized.  GESTATIONAL SAC(S): No gestational sac is visualized. RIGHT OVARY: Unremarkable. LEFT OVARY: Unremarkable. FREE FLUID: Small volume simple free fluid. IMPRESSION: 1. No IUP is visualized. 2. By definition, this reflects a pregnancy of unknown location. Differential considerations include early (normal) iup, abnormal iup/missed abortion, and nonvisualized ectopic pregnancy. Correlate with beta hcg and consider follow-up pelvic US  in 10-14 days. Electronically signed by: Pinkie Pebbles MD 12/18/2023 09:12 PM EDT RP Workstation: [id number]   DG Chest 2 View Result Date: 12/02/2023 CLINICAL DATA:  Chest pain starting 2 weeks ago. Central sharp chest pain. Pain recurs after eating. EXAM: CHEST - 2 VIEW COMPARISON:  None Available. FINDINGS: Shallow inspiration. Heart size and pulmonary vascularity are normal. Lungs are clear. No pleural effusion or pneumothorax. Mediastinal contours appear intact. IMPRESSION: No active cardiopulmonary disease. Electronically Signed   By: Elsie Gravely M.D.   On: 12/02/2023 20:07    ASSESSMENT 1. Pregnancy of unknown anatomic location   2. Pap smear of cervix unsatisfactory   3. BV (bacterial vaginosis)     PLAN Discharge home in stable condition Patient advised to start/continue taking prenatal vitamins Orders Placed This Encounter  Procedures   US  MFM OB TRANSVAGINAL    Standing Status:   Future    Expected Date:   12/30/2023    Expiration Date:   12/22/2024    Reason for Exam (SYMPTOM  OR DIAGNOSIS REQUIRED):   unknown location    Preferred imaging location?:   [facility] Ul[facility]ound   Beta hCG quant (ref lab)    Eveline Lynwood MATSU, MD  12/23/2023  4:25 PM

## 2023-12-23 NOTE — Progress Notes (Signed)
 Pt is in the office for repeat pap and follow up hcg Pt states that she had watery spotting on Saturday but none since and intermittent pelvic pain/cramping.

## 2023-12-24 LAB — BETA HCG QUANT (REF LAB): hCG Quant: 1266 m[IU]/mL

## 2023-12-28 LAB — CYTOLOGY - PAP: Diagnosis: NEGATIVE

## 2024-01-01 ENCOUNTER — Ambulatory Visit: Admitting: Physician Assistant

## 2024-01-06 ENCOUNTER — Ambulatory Visit: Admitting: Advanced Practice Midwife

## 2024-01-14 ENCOUNTER — Ambulatory Visit: Admitting: *Deleted

## 2024-01-14 ENCOUNTER — Other Ambulatory Visit (INDEPENDENT_AMBULATORY_CARE_PROVIDER_SITE_OTHER): Payer: Self-pay

## 2024-01-14 VITALS — BP 109/73 | HR 65 | Wt 152.3 lb

## 2024-01-14 DIAGNOSIS — Z3A01 Less than 8 weeks gestation of pregnancy: Secondary | ICD-10-CM

## 2024-01-14 DIAGNOSIS — O3680X0 Pregnancy with inconclusive fetal viability, not applicable or unspecified: Secondary | ICD-10-CM

## 2024-01-14 DIAGNOSIS — Z3480 Encounter for supervision of other normal pregnancy, unspecified trimester: Secondary | ICD-10-CM | POA: Insufficient documentation

## 2024-01-14 DIAGNOSIS — Z3481 Encounter for supervision of other normal pregnancy, first trimester: Secondary | ICD-10-CM

## 2024-01-14 DIAGNOSIS — Z3A08 8 weeks gestation of pregnancy: Secondary | ICD-10-CM

## 2024-01-14 MED ORDER — BLOOD PRESSURE KIT DEVI: 1.0000 | 0 refills | Status: AC

## 2024-01-14 MED ORDER — PREPLUS 27-1 MG PO TABS: 1.0000 | ORAL_TABLET | Freq: Every day | ORAL | 13 refills | Status: AC

## 2024-01-14 MED ORDER — METRONIDAZOLE 0.75 % VA GEL: 1.0000 | Freq: Every day | VAGINAL | 1 refills | Status: DC

## 2024-01-14 NOTE — Progress Notes (Signed)
 New OB Intake  I connected with Molly Rose  on 01/14/24 at 10:15 AM EDT by In Person Visit and verified that I am speaking with the correct person using two identifiers. Nurse is located at [facility] and pt is located at [place].  I discussed the limitations, risks, security and privacy concerns of performing an evaluation and management service by telephone and the availability of in person appointments. I also discussed with the patient that there may be a patient responsible charge related to this service. The patient expressed understanding and agreed to proceed.  I explained I am completing New OB Intake today. We discussed EDD of 08/23/24 based on LMP of 11/17/23. Pt is G2P1001. I reviewed her allergies, medications and Medical/Surgical/OB history.    Patient Active Problem List   Diagnosis Date Noted   Supervision of other normal pregnancy, antepartum 01/14/2024   History of cholestasis during pregnancy 02/06/2022   Carrier of spinal muscular atrophy 07/14/2021   Complicated migraine 09/18/2015   Episodic tension-type headache 09/18/2015     Concerns addressed today  Delivery Plans Plans to deliver at [facility]. Discussed the nature of our practice with multiple providers including residents and students as well as female and female providers. Due to the size of the practice, the delivering provider may not be the same as those providing prenatal care.   Patient is interested in water birth.  MyChart/Babyscripts MyChart access verified. I explained pt will have some visits in office and some virtually. Babyscripts instructions given and order placed. Patient verifies receipt of registration text/e-mail. Account successfully created and app downloaded. If patient is a candidate for Optimized scheduling, add to sticky note.   Blood Pressure Cuff/Weight Scale Blood pressure cuff ordered for patient to pick-up from [employer]. Explained after first prenatal appt pt will check  weekly and document in Babyscripts. Patient does not have weight scale; patient may purchase if they desire to track weight weekly in Babyscripts.  Anatomy US  Explained first scheduled US  will be around 19 weeks. Anatomy US  scheduled for TBD at TBD.  Is patient a candidate for Babyscripts Optimization? No, due to Risk Factors   First visit review I reviewed new OB appt with patient. Explained pt will be seen by Molly Moats, PA at first visit. Discussed Molly Rose genetic screening with patient. Requests Panorama. Routine prenatal labs OB Urine only collected at today's visit. Initial OB labs deferred to New OB appt.   Last Pap Diagnosis  Date Value Ref Range Status  12/23/2023   Final   - Negative for intraepithelial lesion or malignancy (NILM)    [name], RN 01/14/2024  11:03 AM

## 2024-01-14 NOTE — Patient Instructions (Signed)

## 2024-01-16 LAB — URINE CULTURE, OB REFLEX

## 2024-01-16 LAB — CULTURE, OB URINE

## 2024-02-02 ENCOUNTER — Telehealth: Payer: Self-pay

## 2024-02-02 NOTE — Telephone Encounter (Signed)
 Returned call, pt stated that she has been having bad cramping since Thursday without bleeding. Pt states that Thursday cramping was 10/10 but she took Tylenol  today and it is at 4/10. Pt states that she has not been drinking a lot of water, advised to rest and increase fluids, if symptoms do not improve or bleeding occurs, report to hospital, pt agreed.

## 2024-02-10 ENCOUNTER — Other Ambulatory Visit: Payer: Self-pay | Admitting: *Deleted

## 2024-02-10 MED ORDER — PROMETHAZINE HCL 25 MG PO TABS: 25.0000 mg | ORAL_TABLET | Freq: Four times a day (QID) | ORAL | 0 refills | Status: DC | PRN

## 2024-02-10 NOTE — Progress Notes (Signed)
 Pt called to office for nausea Rx.  Phenergan  sent in per protocol.  Advised to discuss at upcoming appt if no relief with Rx.

## 2024-02-12 ENCOUNTER — Ambulatory Visit (INDEPENDENT_AMBULATORY_CARE_PROVIDER_SITE_OTHER): Admitting: Physician Assistant

## 2024-02-12 ENCOUNTER — Encounter: Payer: Self-pay | Admitting: Physician Assistant

## 2024-02-12 VITALS — BP 109/63 | HR 73 | Wt 149.0 lb

## 2024-02-12 DIAGNOSIS — Z3A12 12 weeks gestation of pregnancy: Secondary | ICD-10-CM | POA: Diagnosis not present

## 2024-02-12 DIAGNOSIS — Z148 Genetic carrier of other disease: Secondary | ICD-10-CM

## 2024-02-12 DIAGNOSIS — Z3481 Encounter for supervision of other normal pregnancy, first trimester: Secondary | ICD-10-CM

## 2024-02-12 DIAGNOSIS — Z3480 Encounter for supervision of other normal pregnancy, unspecified trimester: Secondary | ICD-10-CM

## 2024-02-12 DIAGNOSIS — O219 Vomiting of pregnancy, unspecified: Secondary | ICD-10-CM

## 2024-02-12 MED ORDER — ONDANSETRON 4 MG PO TBDP: 4.0000 mg | ORAL_TABLET | Freq: Four times a day (QID) | ORAL | 0 refills | Status: DC | PRN

## 2024-02-12 MED ORDER — ASPIRIN 81 MG PO TBEC: 81.0000 mg | DELAYED_RELEASE_TABLET | Freq: Every day | ORAL | 2 refills | Status: DC

## 2024-02-12 NOTE — Patient Instructions (Signed)
 Safe Over-The-Counter Medications in Pregnancy   Acne:  Benzoyl Peroxide  Salicylic Acid   Backache/Headache:  Tylenol : 2 regular strength every 4 hours OR               2 Extra strength every 6 hours   Colds/Coughs/Allergies:  Benadryl  (alcohol free) 25 mg every 6 hours as needed  Breath right strips  Claritin  Cepacol throat lozenges  Chloraseptic throat spray  Cold-Eeze- up to three times per day  Cough drops, alcohol free  Flonase (by prescription only)  Guaifenesin  Mucinex  Robitussin DM (plain only, alcohol free)  Saline nasal spray/drops  Sudafed (pseudoephedrine) & Actifed * use only after [redacted] weeks gestation and if you do not have high blood pressure  Tylenol   Vicks Vaporub  Zinc lozenges  Zyrtec   Constipation:  Colace  Ducolax suppositories  Fleet enema  Glycerin  suppositories  Metamucil  Milk of magnesia  Miralax  Senokot  Smooth move tea   Diarrhea:  Kaopectate  Imodium A-D   *NO pepto Bismol*  Hemorrhoids:  Anusol  Anusol HC  Preparation H  Tucks   Indigestion:  Tums  Maalox  Mylanta  Zantac  Pepcid   Insomnia:  Benadryl  (alcohol free) 25mg  every 6 hours as needed  Tylenol  PM  Unisom, no Gelcaps   Leg Cramps:  Tums  MagGel   Nausea/Vomiting:  Bonine  Dramamine  Emetrol  Ginger extract  Sea bands  Meclizine  Nausea medication to take during pregnancy:  Unisom (doxylamine succinate 25 mg tablets) Take one tablet daily at bedtime. If symptoms are not adequately controlled, the dose can be increased to a maximum recommended dose of two tablets daily (1/2 tablet in the morning, 1/2 tablet mid-afternoon and one at bedtime).  Vitamin B6 100mg  tablets. Take one tablet twice a day (up to 200 mg per day).   Skin Rashes:  Aveeno products  Benadryl  cream or 25mg  every 6 hours as needed  Calamine Lotion  1% cortisone cream   Yeast infection:  Gyne-lotrimin 7  Monistat 7   **If taking multiple medications, please check labels to  avoid duplicating the same active ingredients  **take medication as directed on the label  ** Do not exceed 4000 mg of tylenol  in 24 hours  **Do not take medications that contain aspirin or ibuprofen      While you are pregnant, there are some foods you should not eat or eat only in small amounts:  Certain types of cooked fish--While you're pregnant, do not eat bigeye tuna, king mackerel, marlin, orange roughy, shark, swordfish, or tilefish. Limit white (albacore) tuna to only 6 ounces a week. These fish may have high levels of mercury, which can be harmful during pregnancy. All other types of cooked fish are safe and healthy for you and your pregnancy. Try to eat at least two servings of fish or shellfish per week.  Caffeine --Caffeine  is found in coffee, tea, chocolate, energy drinks, and soft drinks. It's a good idea to limit your daily intake of caffeine  to less than 200 milligrams, which is the amount in one 12-ounce cup of coffee. Limiting caffeine  can help with nausea and sleep problems.  Sushi--Raw fish may be harmful during pregnancy. Cooked sushi is fine.  Unpasteurized milk and cheese--These foods can cause a disease called listeriosis. Avoid cheeses that are made with raw milk (such as some feta, queso fresco, and blue cheeses). Hot dogs and lunch meats can also cause this disease, although it's rare. To be on  the safe side, only eat hot dogs and lunch meats that have been heated until steaming hot.

## 2024-02-12 NOTE — Progress Notes (Signed)
 Pt presents for NOB visit. Pt c/o lower abd and back pain. Requesting Rx for zofran 

## 2024-02-12 NOTE — Progress Notes (Signed)
 PRENATAL VISIT NOTE  Subjective:  Molly Rose is a 23 y.o. G2P1001 at [redacted]w[redacted]d being seen today for her first prenatal visit for this pregnancy.  She is currently monitored for the following issues for this low-risk pregnancy and has Complicated migraine; Episodic tension-type headache; Carrier of spinal muscular atrophy; History of cholestasis during pregnancy; and Supervision of other normal pregnancy, antepartum on their problem list.  Patient reports nausea, hip and back pain in pregnancy.  Contractions: Not present. Vag. Bleeding: None.  Movement: Absent. Denies leaking of fluid.   The following portions of the patient's history were reviewed and updated as appropriate: allergies, current medications, past family history, past medical history, past social history, past surgical history and problem list.   Objective:   Vitals:   02/12/24 1339  BP: 109/63  Pulse: 73  Weight: 149 lb (67.6 kg)    Fetal Status: Fetal Heart Rate (bpm): 152 Fundal Height: 11 cm Movement: Absent     General:  Alert, oriented and cooperative. Patient is in no acute distress.  Skin: Skin is warm and dry. No rash noted.   Cardiovascular: Normal heart rate and rhythm noted  Respiratory: Normal respiratory effort, no problems with respiration noted. Clear to auscultation.   Abdomen: Soft, gravid, appropriate for gestational age. Normal bowel sounds. Non-tender. Pain/Pressure: Present     Pelvic: Cervical exam deferred       Normal cervical contour, no lesions, no bleeding following pap, normal discharge  Extremities: Normal range of motion.  Edema: None  Mental Status: Normal mood and affect. Normal behavior. Normal judgment and thought content.    Indications for ASA therapy (per uptodate) One of the following: Previous pregnancy with preeclampsia, especially early onset and with an adverse outcome Yes Multifetal gestation No Chronic hypertension No Type 1 or 2 diabetes mellitus No Chronic  kidney disease No Autoimmune disease (antiphospholipid syndrome, systemic lupus erythematosus) No  Two or more of the following: Nulliparity No Obesity (body mass index >30 kg/m2) Yes Family history of preeclampsia in mother or sister No Age >=35 years No Sociodemographic characteristics (African American race, low socioeconomic level) No Personal risk factors (eg, previous pregnancy with low birth weight or small for gestational age infant, previous adverse pregnancy outcome [eg, stillbirth], interval >10 years between pregnancies) No   Assessment and Plan:  Pregnancy: G2P1001 at [redacted]w[redacted]d  1. Supervision of other normal pregnancy, antepartum (Primary) Initial labs drawn. Continue prenatal vitamins. Genetic Screening discussed: NIPS, carrier screening and AFP  Ultrasound discussed; fetal anatomic survey: Scheduled 03/31/24 Problem list reviewed and updated. Reviewed Brx optimized schedule, patient agreeable The nature of [facility] with multiple MDs and other Advanced Practice Providers was explained to patient; also emphasized that residents, students are part of our team. Routine obstetric precautions reviewed.  - CBC/D/Plt+RPR+Rh+ABO+RubIgG... - Hemoglobin A1c - Comprehensive metabolic panel with GFR - Protein / creatinine ratio, urine - Ambulatory referral to Physical Therapy - PANORAMA PRENATAL TEST  2. [redacted] weeks gestation of pregnancy Anticipatory guidance about next visits/weeks of pregnancy given.   3. Carrier of spinal muscular atrophy  4. Nausea/vomiting in pregnancy - ondansetron  (ZOFRAN -ODT) 4 MG disintegrating tablet; Take 1 tablet (4 mg total) by mouth every 6 (six) hours as needed for nausea.  Dispense: 20 tablet; Refill: 0   Preterm labor/first trimester warning symptoms and general obstetric precautions including but not limited to vaginal bleeding, contractions, leaking of fluid and fetal movement were reviewed in detail with  the patient.  Please refer to After Visit Summary for other counseling recommendations.   No follow-ups on file.  Future Appointments  Date Time Provider Department Center  03/31/2024  2:00 PM [facility] PROVIDER 1 [facility]  03/31/2024  2:30 PM [facility] US2 [facility]    Jorene FORBES Moats, PA-C

## 2024-02-13 LAB — CBC/D/PLT+RPR+RH+ABO+RUBIGG...
Antibody Screen: NEGATIVE
Basophils Absolute: 0 x10E3/uL (ref 0.0–0.2)
Basos: 1 %
EOS (ABSOLUTE): 0.1 x10E3/uL (ref 0.0–0.4)
Eos: 1 %
HCV Ab: NONREACTIVE
HIV Screen 4th Generation wRfx: NONREACTIVE
Hematocrit: 41.3 % (ref 34.0–46.6)
Hemoglobin: 13.7 g/dL (ref 11.1–15.9)
Hepatitis B Surface Ag: NEGATIVE
Immature Grans (Abs): 0 x10E3/uL (ref 0.0–0.1)
Immature Granulocytes: 0 %
Lymphocytes Absolute: 1.5 x10E3/uL (ref 0.7–3.1)
Lymphs: 18 %
MCH: 31.2 pg (ref 26.6–33.0)
MCHC: 33.2 g/dL (ref 31.5–35.7)
MCV: 94 fL (ref 79–97)
Monocytes Absolute: 0.6 x10E3/uL (ref 0.1–0.9)
Monocytes: 7 %
Neutrophils Absolute: 6.1 x10E3/uL (ref 1.4–7.0)
Neutrophils: 73 %
Platelets: 275 x10E3/uL (ref 150–450)
RBC: 4.39 x10E6/uL (ref 3.77–5.28)
RDW: 12.2 % (ref 11.7–15.4)
RPR Ser Ql: NONREACTIVE
Rh Factor: POSITIVE
Rubella Antibodies, IGG: 1.15 {index} (ref >0.99)
WBC: 8.4 x10E3/uL (ref 3.4–10.8)

## 2024-02-13 LAB — COMPREHENSIVE METABOLIC PANEL WITH GFR
ALT: 16 IU/L (ref 0–32)
AST: 15 IU/L (ref 0–40)
Albumin: 4.3 g/dL (ref 4.0–5.0)
Alkaline Phosphatase: 50 IU/L (ref 41–116)
BUN/Creatinine Ratio: 15 (ref 9–23)
BUN: 9 mg/dL (ref 6–20)
Bilirubin Total: 0.4 mg/dL (ref 0.0–1.2)
CO2: 21 mmol/L (ref 20–29)
Calcium: 9.4 mg/dL (ref 8.7–10.2)
Chloride: 104 mmol/L (ref 96–106)
Creatinine, Ser: 0.6 mg/dL (ref 0.57–1.00)
Globulin, Total: 2.2 g/dL (ref 1.5–4.5)
Glucose: 81 mg/dL (ref 70–99)
Potassium: 4.5 mmol/L (ref 3.5–5.2)
Sodium: 139 mmol/L (ref 134–144)
Total Protein: 6.5 g/dL (ref 6.0–8.5)
eGFR: 129 mL/min/1.73 (ref >59)

## 2024-02-13 LAB — PROTEIN / CREATININE RATIO, URINE
Creatinine, Urine: 127.6 mg/dL
Protein, Ur: 15 mg/dL
Protein/Creat Ratio: 118 mg/g{creat} (ref 0–200)

## 2024-02-13 LAB — HEMOGLOBIN A1C
Est. average glucose Bld gHb Est-mCnc: 100 mg/dL
Hgb A1c MFr Bld: 5.1 % (ref 4.8–5.6)

## 2024-02-13 LAB — HCV INTERPRETATION

## 2024-02-15 ENCOUNTER — Ambulatory Visit: Payer: Self-pay | Admitting: Physician Assistant

## 2024-02-18 LAB — PANORAMA PRENATAL TEST FULL PANEL:PANORAMA TEST PLUS 5 ADDITIONAL MICRODELETIONS: FETAL FRACTION: 7.1

## 2024-02-23 NOTE — Addendum Note (Signed)
 Addended by: TERESA MARGY CROME on: 02/23/2024 03:14 PM   Modules accepted: Orders

## 2024-02-27 ENCOUNTER — Other Ambulatory Visit: Payer: Self-pay

## 2024-02-27 ENCOUNTER — Inpatient Hospital Stay (HOSPITAL_COMMUNITY)
Admission: AD | Admit: 2024-02-27 | Discharge: 2024-02-27 | Disposition: A | Discharge status: Home / Self Care | Attending: Obstetrics & Gynecology | Admitting: Obstetrics & Gynecology

## 2024-02-27 DIAGNOSIS — R102 Pelvic and perineal pain unspecified side: Secondary | ICD-10-CM | POA: Insufficient documentation

## 2024-02-27 DIAGNOSIS — M549 Dorsalgia, unspecified: Secondary | ICD-10-CM | POA: Diagnosis not present

## 2024-02-27 DIAGNOSIS — Z3A14 14 weeks gestation of pregnancy: Secondary | ICD-10-CM | POA: Diagnosis not present

## 2024-02-27 DIAGNOSIS — O26892 Other specified pregnancy related conditions, second trimester: Secondary | ICD-10-CM

## 2024-02-27 DIAGNOSIS — R109 Unspecified abdominal pain: Secondary | ICD-10-CM | POA: Diagnosis present

## 2024-02-27 LAB — URINALYSIS, ROUTINE W REFLEX MICROSCOPIC
Bilirubin Urine: NEGATIVE
Glucose, UA: NEGATIVE mg/dL
Hgb urine dipstick: NEGATIVE
Ketones, ur: NEGATIVE mg/dL
Nitrite: NEGATIVE
Protein, ur: NEGATIVE mg/dL
Specific Gravity, Urine: 1.017 (ref 1.005–1.030)
pH: 7 (ref 5.0–8.0)

## 2024-02-27 LAB — WET PREP, GENITAL
Clue Cells Wet Prep HPF POC: NONE SEEN
Sperm: NONE SEEN
Trich, Wet Prep: NONE SEEN
WBC, Wet Prep HPF POC: <10 (ref <10)
Yeast Wet Prep HPF POC: NONE SEEN

## 2024-02-27 NOTE — MAU Note (Signed)
 Heat packs to back

## 2024-02-27 NOTE — MAU Provider Note (Signed)
 History     CSN: [id number]  Arrival date and time: 02/27/24 1548   Event Date/Time   First Provider Initiated Contact with Patient 02/27/24 2245      Chief Complaint  Patient presents with   Abdominal Pain   Hip Pain    Molly Rose is a 23 y.o. G2P1001 at [redacted]w[redacted]d who receives care at [facility].  She presents today for abdominal and back pain. She reports pain started Monday and is sharp in abdomen and back.  She states the pain is constant and has no relieving factors.  She states she has tried massaging back and stomach with no relief. She rates her pain a 7/10.  She denies vaginal discharge of concern or bleeding.  No issues with urination, but reports increased pain while urinating.  No issues with constipation or diarrhea. No recent sexual activity.   OB History     Gravida  2   Para  1   Term  1   Preterm  0   AB  0   Living  1      SAB  0   IAB  0   Ectopic  0   Multiple  0   Live Births  1           Past Medical History:  Diagnosis Date   Cholestasis during pregnancy    Migraine    UTI (urinary tract infection)     Past Surgical History:  Procedure Laterality Date   WISDOM TOOTH EXTRACTION      Family History  Problem Relation Age of Onset   Healthy Mother    Healthy Father     Social History   Tobacco Use   Smoking status: Never   Smokeless tobacco: Never  Vaping Use   Vaping status: Former  Substance Use Topics   Alcohol use: No    Alcohol/week: 0.0 standard drinks of alcohol   Drug use: No    Allergies:  Allergies  Allergen Reactions   Rocephin  [Ceftriaxone ] Anaphylaxis    See MAU note from 07/25/2021. Hives on face, anaphylaxis reaction with drop in O2 saturation.    Other     Seasonal Allergies   Shellfish Allergy     Medications Prior to Admission  Medication Sig Dispense Refill Last Dose/Taking   aspirin  EC 81 MG tablet Take 1 tablet (81 mg total) by mouth at bedtime. Start taking when you are [redacted] weeks  pregnant for rest of pregnancy for prevention of preeclampsia 300 tablet 2    Blood Pressure Monitoring (BLOOD PRESSURE KIT) DEVI 1 Device by Does not apply route once a week. 1 each 0    metroNIDAZOLE  (METROGEL ) 0.75 % vaginal gel Place 1 Applicatorful vaginally at bedtime. Apply one applicatorful to vagina at bedtime for 5 days (Patient not taking: Reported on 02/12/2024) 70 g 1    ondansetron  (ZOFRAN -ODT) 4 MG disintegrating tablet Take 1 tablet (4 mg total) by mouth every 6 (six) hours as needed for nausea. 20 tablet 0    Prenatal Vit-Fe Fumarate-FA (PREPLUS) 27-1 MG TABS Take 1 tablet by mouth daily. 30 tablet 13    promethazine  (PHENERGAN ) 25 MG tablet Take 1 tablet (25 mg total) by mouth every 6 (six) hours as needed for nausea. (Patient not taking: Reported on 02/12/2024) 30 tablet 0     Review of Systems  Gastrointestinal:  Positive for abdominal pain, constipation (Feels that pressure is not consistent with output.) and nausea. Negative for diarrhea and vomiting.  Genitourinary:  Negative for difficulty urinating, dysuria, vaginal bleeding and vaginal discharge.  Musculoskeletal:  Positive for back pain.   Physical Exam   Blood pressure 113/63, pulse 81, temperature 98.2 F (36.8 C), temperature source Oral, resp. rate 15, height 5' (1.524 m), weight 67.8 kg, last menstrual period 11/17/2023, SpO2 97%, currently breastfeeding.  Physical Exam Vitals and nursing note reviewed. Exam conducted with a chaperone present Rae, RN).  Constitutional:      Appearance: She is well-developed.  Eyes:     Conjunctiva/sclera: Conjunctivae normal.  Cardiovascular:     Rate and Rhythm: Normal rate.     Heart sounds: Normal heart sounds.  Pulmonary:     Effort: Pulmonary effort is normal. No respiratory distress.     Breath sounds: Normal breath sounds.  Abdominal:     General: Bowel sounds are normal.  Musculoskeletal:        General: Normal range of motion.     Cervical back: Normal range  of motion.  Skin:    General: Skin is warm and dry.  Neurological:     Mental Status: She is alert and oriented to person, place, and time.  Psychiatric:        Mood and Affect: Mood normal.        Behavior: Behavior normal.     MAU Course  Procedures Results for orders placed or performed during the hospital encounter of 02/27/24 (from the past 24 hours)  Urinalysis, Routine w reflex microscopic -Urine, Clean Catch     Status: Abnormal   Collection Time: 02/27/24  5:10 PM  Result Value Ref Range   Color, Urine YELLOW YELLOW   APPearance CLOUDY (A) CLEAR   Specific Gravity, Urine 1.017 1.005 - 1.030   pH 7.0 5.0 - 8.0   Glucose, UA NEGATIVE NEGATIVE mg/dL   Hgb urine dipstick NEGATIVE NEGATIVE   Bilirubin Urine NEGATIVE NEGATIVE   Ketones, ur NEGATIVE NEGATIVE mg/dL   Protein, ur NEGATIVE NEGATIVE mg/dL   Nitrite NEGATIVE NEGATIVE   Leukocytes,Ua TRACE (A) NEGATIVE   RBC / HPF 0-5 0 - 5 RBC/hpf   WBC, UA 0-5 0 - 5 WBC/hpf   Bacteria, UA FEW (A) NONE SEEN   Squamous Epithelial / HPF 0-5 0 - 5 /HPF   Mucus PRESENT    Amorphous Crystal PRESENT   Wet prep, genital     Status: None   Collection Time: 02/27/24  5:10 PM  Result Value Ref Range   Yeast Wet Prep HPF POC NONE SEEN NONE SEEN   Trich, Wet Prep NONE SEEN NONE SEEN   Clue Cells Wet Prep HPF POC NONE SEEN NONE SEEN   WBC, Wet Prep HPF POC <10 <10   Sperm NONE SEEN    Patient informed that the ultrasound is considered a limited OB ultrasound and is not intended to be a complete ultrasound exam.  Patient also informed that the ultrasound is not being completed with the intent of assessing for fetal or placental anomalies or any pelvic abnormalities.  Explained that the purpose of today's ultrasound is to assess for  reassurance and viability.  Patient acknowledges the purpose of the exam and the limitations of the study.   SIUP with good movement noted. FHR 130s.  MDM Physical Exam Cultures: Wet Prep and GC/CT Labs:  UA, UC BSUS Assessment and Plan  23 year old, G2P1001  SIUP at 14.4 weeks Pelvic Pressure Back Pain  -Reviewed POC with patient. -Exam performed.  -Reassured that pelvic pressure and  back pain is normal finding in pregnancy.  -Discussed usage of comfort measures including maternity belt, compression stockings, hot/cold compresses. Information placed in AVS.  -Patient offered and declines pain medication. -Discussed applying heat to area and patient agreeable. Heating pads given.  -BSUS performed and as above.  -Informed of UA showing bacteria, but nothing highly suggestive of UTI.  However will send for culture and treat accordingly.  -Informed of negative wet prep.  -Bleeding precautions reviewed. -Encouraged to call primary office or return to MAU if symptoms worsen or with the onset of new symptoms. -Discharged to home in stable condition.  Harlene LITTIE Duncans MSN, CNM Advanced Practice Provider, Center for [facility] 02/27/2024, 10:45 PM

## 2024-02-27 NOTE — Discharge Instructions (Signed)
Safe Medications in Pregnancy   Acne: Benzoyl Peroxide Salicylic Acid  Backache/Headache: Tylenol: 2 regular strength every 4 hours OR              2 Extra strength every 6 hours  Colds/Coughs/Allergies: Benadryl (alcohol free) 25 mg every 6 hours as needed Breath right strips Claritin Cepacol throat lozenges Chloraseptic throat spray Cold-Eeze- up to three times per day Cough drops, alcohol free Flonase (by prescription only) Guaifenesin Mucinex Robitussin DM (plain only, alcohol free) Saline nasal spray/drops Sudafed (pseudoephedrine) & Actifed ** use only after [redacted] weeks gestation and if you do not have high blood pressure Tylenol Vicks Vaporub Zinc lozenges Zyrtec   Constipation: Colace Ducolax suppositories Fleet enema Glycerin suppositories Metamucil Milk of magnesia Miralax Senokot Smooth move tea  Diarrhea: Kaopectate Imodium A-D  *NO pepto Bismol  Hemorrhoids: Anusol Anusol HC Preparation H Tucks  Indigestion: Tums Maalox Mylanta Zantac  Pepcid  Insomnia: Benadryl (alcohol free) 25mg  every 6 hours as needed Tylenol PM Unisom, no Gelcaps  Leg Cramps: Tums MagGel  Nausea/Vomiting:  Bonine Dramamine Emetrol Ginger extract Sea bands Meclizine  Nausea medication to take during pregnancy:  Unisom (doxylamine succinate 25 mg tablets) Take one tablet daily at bedtime. If symptoms are not adequately controlled, the dose can be increased to a maximum recommended dose of two tablets daily (1/2 tablet in the morning, 1/2 tablet mid-afternoon and one at bedtime). Vitamin B6 100mg  tablets. Take one tablet twice a day (up to 200 mg per day).  Skin Rashes: Aveeno products Benadryl cream or 25mg  every 6 hours as needed Calamine Lotion 1% cortisone cream  Yeast infection: Gyne-lotrimin 7 Monistat 7  Gum/tooth pain: Anbesol  **If taking multiple medications, please check labels to avoid duplicating the same active ingredients **take  medication as directed on the label ** Do not exceed 4000 mg of tylenol in 24 hours **Do not take medications that contain aspirin or ibuprofen         PREGNANCY SUPPORT BELT: You are not alone, Seventy-five percent of women have some sort of abdominal or back pain at some point in their pregnancy. Your baby is growing at a fast pace, which means that your whole body is rapidly trying to adjust to the changes. As your uterus grows, your back may start feeling a bit under stress and this can result in back or abdominal pain that can go from mild, and therefore bearable, to severe pains that will not allow you to sit or lay down comfortably, When it comes to dealing with pregnancy-related pains and cramps, some pregnant women usually prefer natural remedies, which the market is filled with nowadays. For example, wearing a pregnancy support belt can help ease and lessen your discomfort and pain. WHAT ARE THE BENEFITS OF WEARING A PREGNANCY SUPPORT BELT? A pregnancy support belt provides support to the lower portion of the belly taking some of the weight of the growing uterus and distributing to the other parts of your body. It is designed make you comfortable and gives you extra support. Over the years, the pregnancy apparel market has been studying the needs and wants of pregnant women and they have come up with the most comfortable pregnancy support belts that woman could ever ask for. In fact, you will no longer have to wear a stretched-out or bulky pregnancy belt that is visible underneath your clothes and makes you feel even more uncomfortable. Nowadays, a pregnancy support belt is made of comfortable and stretchy materials that will not  irritate your skin but will actually make you feel at ease and you will not even notice you are wearing it. They are easy to put on and adjust during the day and can be worn at night for additional support.  BENEFITS: Relives Back pain Relieves Abdominal Muscle and  Leg Pain Stabilizes the Pelvic Ring Offers a Cushioned Abdominal Lift Pad Relieves pressure on the Sciatic Nerve Within Minutes    Locations that Carry Maternity/Pregnancy Support Belts  You can find belts on [employer].com starting at $10-$15.  2.  The [facility] carries belts for $10-$15.        Address/Phone: [address], [address], [place], [place] [address], [phone]  3.  You may be able to file your insurance with [email or web address] and have a belt mailed to you.  If you have any problems getting the belt, let your [facility] for [facility] office know.

## 2024-02-27 NOTE — MAU Note (Signed)
 Annmargaret J Lazo-Rios is a 23 y.o. at [redacted]w[redacted]d here in MAU reporting: increase in pelvic pressure that is a constant pain. It gets worse with urination, BM or passing gas. Denies any upper abdominal pain. Feels all lower pain in hips and lower back. States she cannot get comfortable at night, lying on back makes it hurt worse. Pain is worse with quick movements. Denies any VB. Increase in clear vaginal discharge since the beginning of her pregnancy. Denies any recent sexual intercourse. Patient denies any pain with urination or urinary frequency. Patient denies any unusual vaginal discharge, vaginal odor, itching or pain. Has not taken any medication for pain.  Next OB appointment in 2 weeks.  Onset of complaint: 1 week Pain score: 7 back  Vitals:   02/27/24 1706  BP: 113/63  Pulse: 81  Resp: 15  Temp: 98.2 F (36.8 C)  SpO2: 97%     FHT:157 Lab orders placed from triage:  UA, vag swabs

## 2024-03-01 LAB — GC/CHLAMYDIA PROBE AMP (~~LOC~~) NOT AT ARMC
Chlamydia: NEGATIVE
Comment: NEGATIVE
Comment: NORMAL
Neisseria Gonorrhea: NEGATIVE

## 2024-03-01 LAB — CULTURE, OB URINE: Culture: >=100000 — AB

## 2024-03-02 ENCOUNTER — Telehealth: Payer: Self-pay | Admitting: Family Medicine

## 2024-03-02 ENCOUNTER — Other Ambulatory Visit: Payer: Self-pay | Admitting: Family Medicine

## 2024-03-02 DIAGNOSIS — O2342 Unspecified infection of urinary tract in pregnancy, second trimester: Secondary | ICD-10-CM | POA: Insufficient documentation

## 2024-03-02 MED ORDER — NITROFURANTOIN MONOHYD MACRO 100 MG PO CAPS: 100.0000 mg | ORAL_CAPSULE | Freq: Two times a day (BID) | ORAL | 1 refills | Status: DC

## 2024-03-02 NOTE — Telephone Encounter (Signed)
 Confirmed patient identity with 2 modifiers Spoke with patient informed about UTI Reviewed and confirmed pharmacy Sent in Macrobid  Reviewed plan of care for treatment and repeat urine culture at next [facility] visit Patient voiced understanding. Had a question about if macrobid  was related to rocephin  and reassured it is not. No further question  Suzen Maryan Masters, MD, MPH, ABFM, [facility] Attending Physician Center for [facility]

## 2024-03-02 NOTE — Progress Notes (Signed)
 Reviewed results from UCx on 10/3 which showed +UTI with Staph Haemolyticus that is pan sensitive. Given sx and positive culture.  Sent in medication- Macrobid  100mg  BID

## 2024-03-09 IMAGING — US US RENAL
1 series · 15 of 25 positions shown · non-contrast
Comparison: CT 09/24/2013

CLINICAL DATA: Right flank pain

EXAM:
RENAL / URINARY TRACT ULTRASOUND COMPLETE

[Series 1: us renal · 15 of 32 slices shown]
[im 1/32]
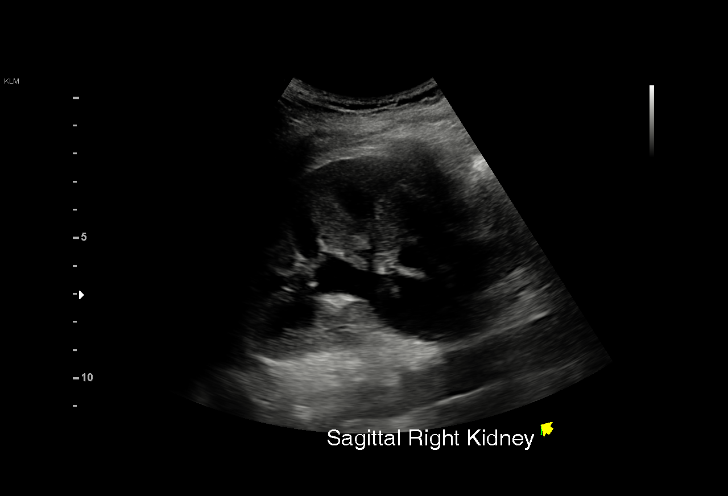
[im 3/32]
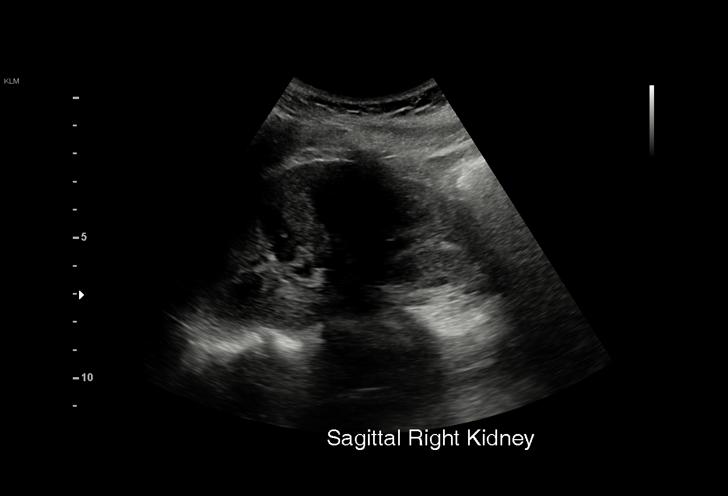
[im 6/32]
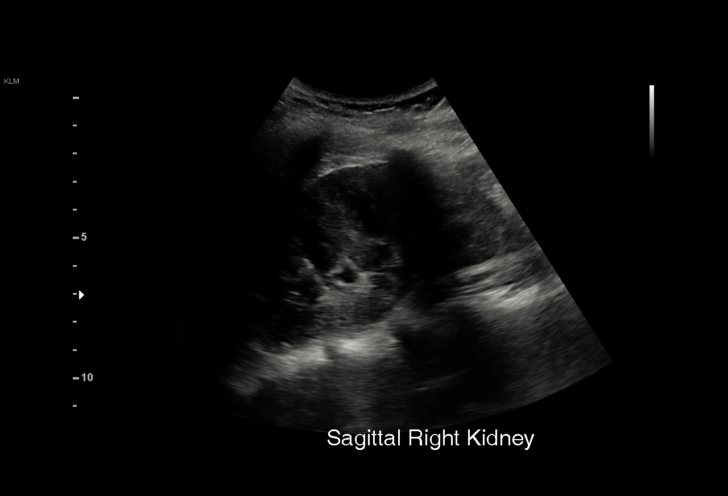
[im 7/32]
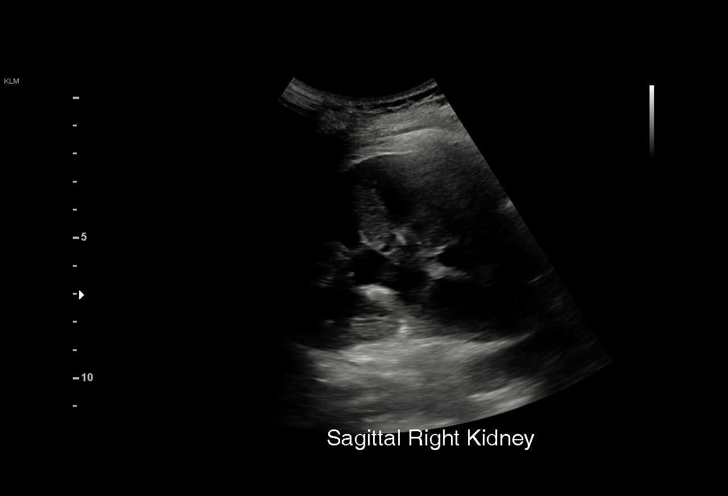
[im 10/32]
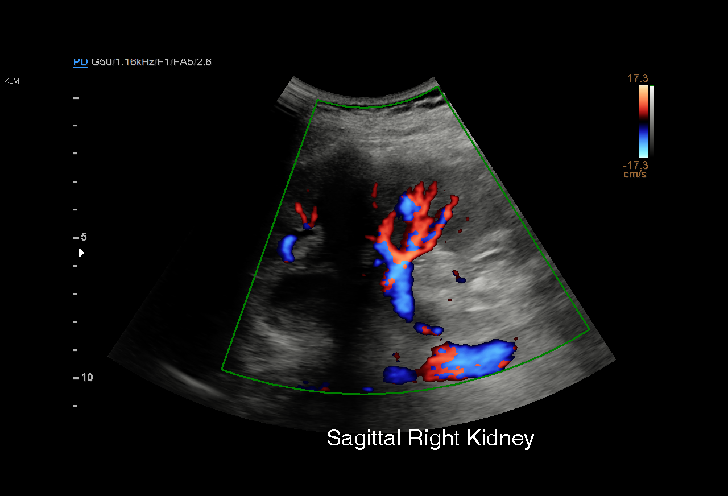
[im 12/32]
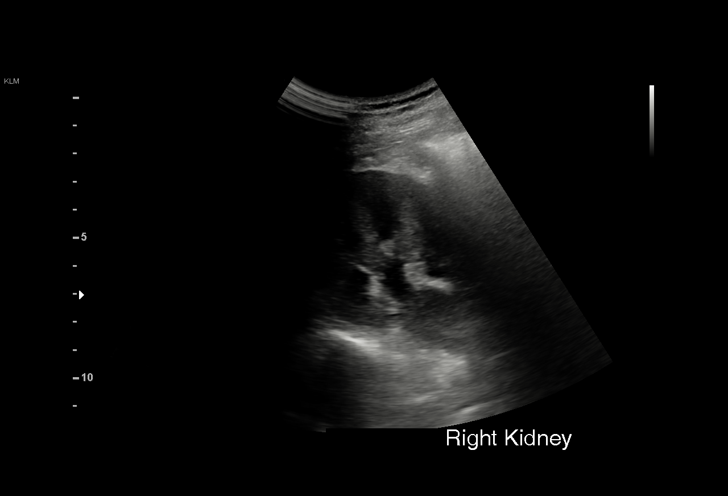
[im 13/32]
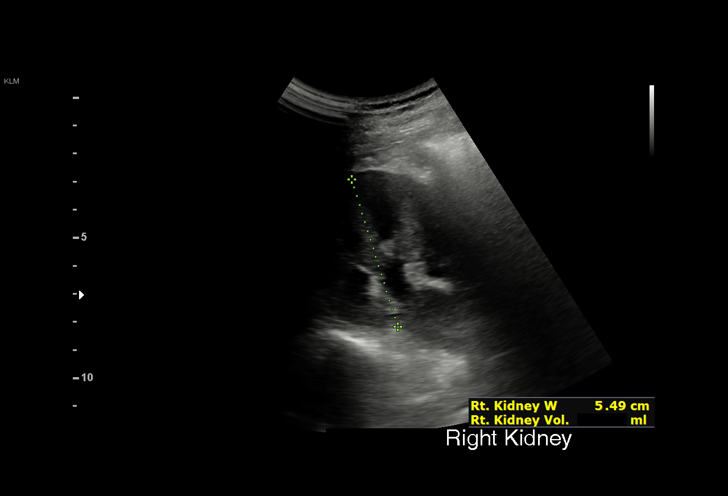
[im 16/32]
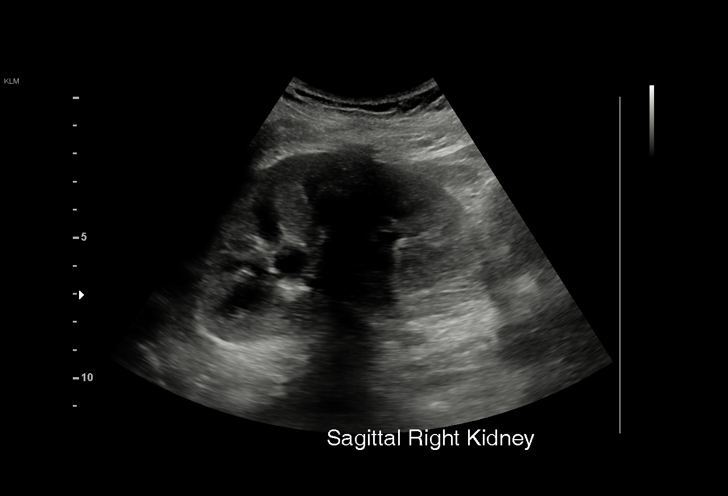
[im 19/32]
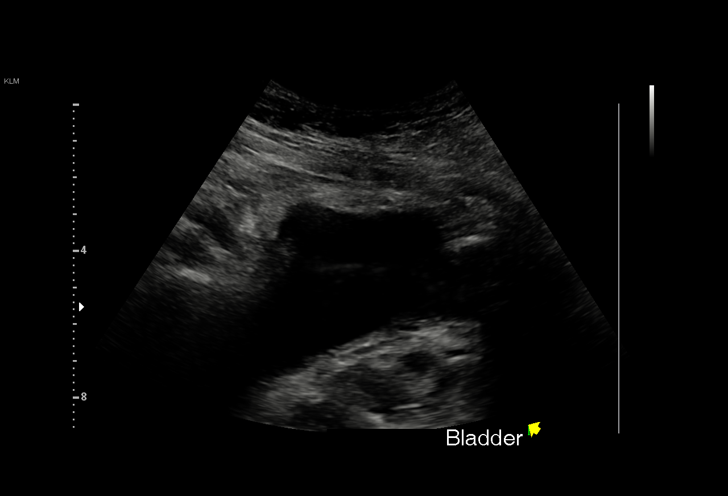
[im 20/32]
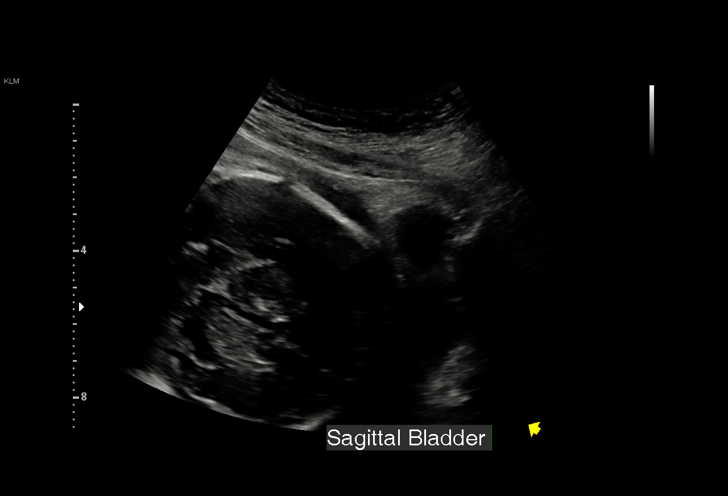
[im 22/32]
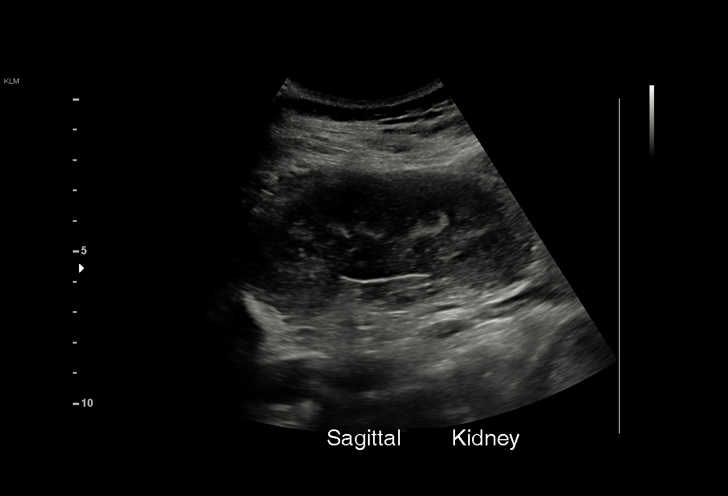
[im 25/32]
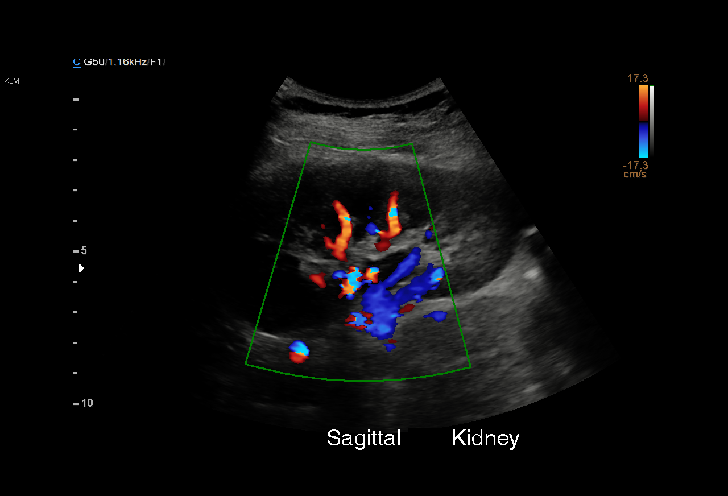
[im 26/32]
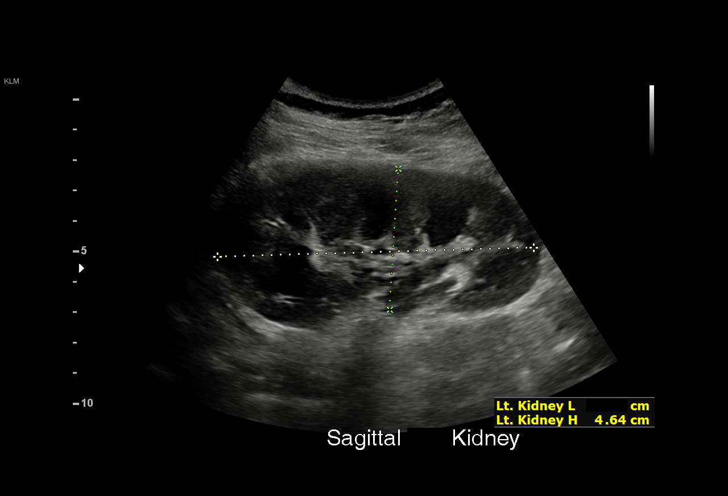
[im 29/32]
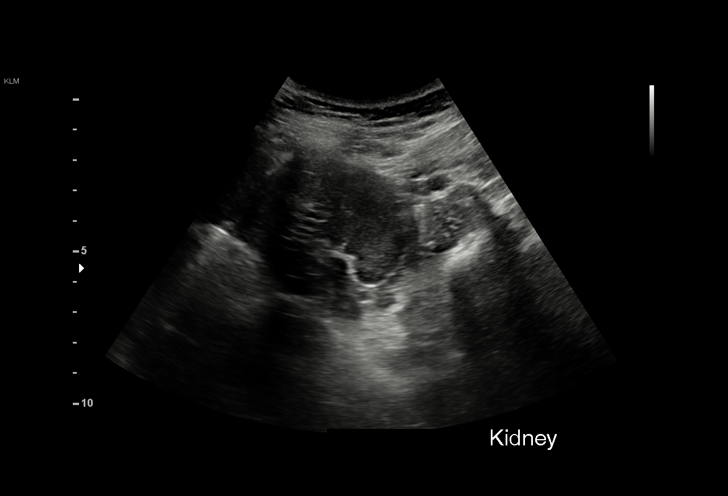
[im 32/32]
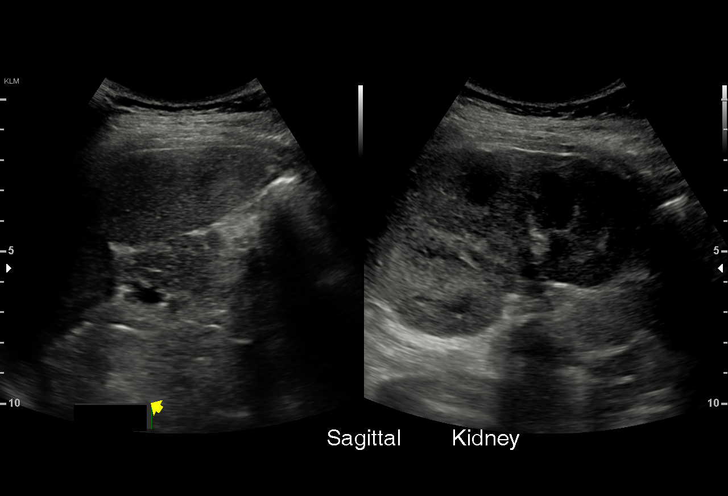

[15 of 25 positions shown; findings below may reference images not displayed]

FINDINGS: Right Kidney:

Renal measurements: 10.6 x 5.6 x 5.5 cm = volume: 170 mL. Mild right
hydronephrosis. Normal echotexture. No mass.

Left Kidney:

Renal measurements: 10.4 x 4.6 x 5.3 cm = volume: 134 mL.
Echogenicity within normal limits. No mass or hydronephrosis
visualized.

Bladder:

Appears normal for degree of bladder distention.

Other:

None.
IMPRESSION: Mild right hydronephrosis.

## 2024-03-10 ENCOUNTER — Ambulatory Visit (INDEPENDENT_AMBULATORY_CARE_PROVIDER_SITE_OTHER): Admitting: Physician Assistant

## 2024-03-10 VITALS — BP 99/60 | HR 74 | Wt 150.8 lb

## 2024-03-10 DIAGNOSIS — Z3A16 16 weeks gestation of pregnancy: Secondary | ICD-10-CM

## 2024-03-10 DIAGNOSIS — Z3480 Encounter for supervision of other normal pregnancy, unspecified trimester: Secondary | ICD-10-CM | POA: Diagnosis not present

## 2024-03-10 DIAGNOSIS — Z8759 Personal history of other complications of pregnancy, childbirth and the puerperium: Secondary | ICD-10-CM | POA: Diagnosis not present

## 2024-03-10 NOTE — Progress Notes (Signed)
   PRENATAL VISIT NOTE  Subjective:  Molly Rose is a 23 y.o. G2P1001 at [redacted]w[redacted]d being seen today for ongoing prenatal care.  She is currently monitored for the following issues for this high-risk pregnancy and has Complicated migraine; Episodic tension-type headache; Carrier of spinal muscular atrophy; History of cholestasis during pregnancy; Supervision of other normal pregnancy, antepartum; UTI in pregnancy, antepartum, second trimester; and History of severe pre-eclampsia on their problem list.  Patient reports continued nausea, taking phenergan . Having headaches.  Contractions: Irritability. Vag. Bleeding: None.  Movement: Absent. Denies leaking of fluid.   The following portions of the patient's history were reviewed and updated as appropriate: allergies, current medications, past family history, past medical history, past social history, past surgical history and problem list.   Objective:    Vitals:   03/10/24 1622  BP: 99/60  Pulse: 74  Weight: 150 lb 12.8 oz (68.4 kg)    Fetal Status:  Fetal Heart Rate (bpm): 145 Fundal Height: 15 cm Movement: Absent    General: Alert, oriented and cooperative. Patient is in no acute distress.  Skin: Skin is warm and dry. No rash noted.   Cardiovascular: Normal heart rate noted  Respiratory: Normal respiratory effort, no problems with respiration noted  Abdomen: Soft, gravid, appropriate for gestational age.  Pain/Pressure: Present     Pelvic: Cervical exam deferred        Extremities: Normal range of motion.  Edema: None  Mental Status: Normal mood and affect. Normal behavior. Normal judgment and thought content.   Assessment and Plan:  Pregnancy: G2P1001 at [redacted]w[redacted]d  1. Supervision of other normal pregnancy, antepartum (Primary) Patient doing well BP, FHR, FH appropriate   2. [redacted] weeks gestation of pregnancy Anticipatory guidance about next visits/weeks of pregnancy given.  Anatomy scan 03/31/24  3. History of severe  pre-eclampsia Normotensive on no medications Baseline labs normal Continue ASA  Preterm labor symptoms and general obstetric precautions including but not limited to vaginal bleeding, contractions, leaking of fluid and fetal movement were reviewed in detail with the patient.  Please refer to After Visit Summary for other counseling recommendations.   Return in about 4 weeks (around 04/07/2024) for [facility].  Future Appointments  Date Time Provider Department Center  03/18/2024  4:30 PM Janit Formica, [place] [facility]  03/31/2024  2:00 PM [facility] PROVIDER 1 [facility]  03/31/2024  2:30 PM [facility] US2 [facility]  04/08/2024  4:10 PM Taegen Delker E, PA-C [facility] None    Argel Pablo E Camden Mazzaferro, PA-C

## 2024-03-10 NOTE — Patient Instructions (Signed)
 Take up to 3,000 mg of Tylenol  or Excedrin Tension for headaches

## 2024-03-12 LAB — AFP, SERUM, OPEN SPINA BIFIDA
AFP MoM: 1.32
AFP Value: 44.8 ng/mL
Gest. Age on Collection Date: 16.3 wk
Maternal Age At EDD: 23.9 a
OSBR Risk 1 IN: 4529
Test Results:: NEGATIVE
Weight: 150 [lb_av]

## 2024-03-13 ENCOUNTER — Ambulatory Visit: Payer: Self-pay | Admitting: Physician Assistant

## 2024-03-17 NOTE — Therapy (Signed)
 OUTPATIENT PHYSICAL THERAPY THORACOLUMBAR EVALUATION   Patient Name: Molly Rose MRN: [id number] DOB:Mar 30, 2001, 23 y.o., female Today's Date: 03/18/2024  END OF SESSION:  PT End of Session - 03/18/24 1635     Visit Number 1    Date for Recertification  06/10/24    Authorization Type [place] [employer]    PT Start Time 1635    PT Stop Time 1710    PT Time Calculation (min) 35 min          Past Medical History:  Diagnosis Date   Cholestasis during pregnancy    Migraine    UTI (urinary tract infection)    Past Surgical History:  Procedure Laterality Date   WISDOM TOOTH EXTRACTION     Patient Active Problem List   Diagnosis Date Noted   History of severe pre-eclampsia 03/10/2024   UTI in pregnancy, antepartum, second trimester 03/02/2024   Supervision of other normal pregnancy, antepartum 01/14/2024   History of cholestasis during pregnancy 02/06/2022   Carrier of spinal muscular atrophy 07/14/2021   Complicated migraine 09/18/2015   Episodic tension-type headache 09/18/2015    PCP: Olam Kirby-Leftwich  REFERRING PROVIDER: Jorene Moats  REFERRING DIAG:  Z34.80 (ICD-10-CM) - Supervision of other normal pregnancy, antepartum    Rationale for Evaluation and Treatment: Rehabilitation  THERAPY DIAG:  No diagnosis found.  ONSET DATE: 02/12/24  SUBJECTIVE:                                                                                                                                                                                           SUBJECTIVE STATEMENT: Good, I am having some pain in the back. The doctor told me it was going to get worse as the baby grows. This is my second baby, I did not have issues the first time around.    PERTINENT HISTORY:  Second trimester of pregnancy- 16 weeks   PAIN:  Are you having pain? Yes: NPRS scale: 9/10 Pain location: across low back Pain description: constant, ache  Aggravating factors: I feel it more when I am laying  down and trying to relax,  Relieving factors: nothing  PRECAUTIONS: Other: pregnancy  RED FLAGS: None   WEIGHT BEARING RESTRICTIONS: No  FALLS:  Has patient fallen in last 6 months? No  LIVING ENVIRONMENT: Lives with: lives with their family Lives in: House/apartment  OCCUPATION: Work at [employer], mostly sitting at desk   PLOF: Independent  PATIENT GOALS: find ways to help with my back pain   NEXT MD VISIT: 04/08/24  OBJECTIVE:  Note: Objective measures were completed at Evaluation unless otherwise noted.  DIAGNOSTIC FINDINGS:  PATIENT SURVEYS:  Modified Oswestry 17/50  COGNITION: Overall cognitive status: Within functional limits for tasks assessed     SENSATION: WFL  POSTURE: increased lumbar lordosis and this is due to pregnancy   LUMBAR ROM:   AROM eval  Flexion 75% with pain  Extension 75% with pain  Right lateral flexion 75% with pain  Left lateral flexion WFL  Right rotation WFL  Left rotation WFL   (Blank rows = not tested)  LOWER EXTREMITY ROM:   grossly WFL    LOWER EXTREMITY MMT:  grossly 5/5   FUNCTIONAL TESTS:  5 times sit to stand: 12.30s    TREATMENT DATE: 03/18/24- EVAL                                                                                                                                PATIENT EDUCATION:  Education details: POC, HEP, education on SI belt and safe exercises Person educated: Patient Education method: Explanation Education comprehension: verbalized understanding  HOME EXERCISE PROGRAM: Access Code: [email or web address] URL: [email or web address] Date: 03/18/2024 Prepared by: Almetta Fam  Exercises - Seated Pelvic Tilt  - 1 x daily - 7 x weekly - 2 sets - 10 reps - Cat Cow  - 1 x daily - 7 x weekly - 2 sets - 10 reps - Squat with Chair Touch  - 1 x daily - 7 x weekly - 2 sets - 10 reps - Hip Abduction with Resistance Loop  - 1 x daily - 7 x weekly - 2 sets - 10 reps - Hip Extension with  Resistance Loop  - 1 x daily - 7 x weekly - 2 sets - 10 reps  ASSESSMENT:  CLINICAL IMPRESSION: Patient is a 23 y.o. female who was seen today for physical therapy evaluation and treatment for back pain in pregnancy. She is in her second trimester and currently at 17 weeks. We discussed looking into SIJ/pregnancy belts to help with pressure offloading. Also went over some exercises she can do safely to work on core and LE strengthening to help support her low back. She has a physioball at home but needs to pump it up. Patient is sitting most of the day at work. She reports when she was pregnant with her daughter she did not have these issues but she was also more active and worked two jobs that required more walking and standing. She does have some limitations with ROM in her low back. We are aware that as the baby grows, her back pain will mostly likely increase but we will work on safe exercises to reduce the pain and allow the remainder of her pregnancy to progress with ease.  OBJECTIVE IMPAIRMENTS: decreased ROM and pain.   ACTIVITY LIMITATIONS: lifting, bending, and locomotion level  PERSONAL FACTORS: pregnancy are also affecting patient's functional outcome.   REHAB POTENTIAL: Good  CLINICAL DECISION MAKING: Stable/uncomplicated  EVALUATION COMPLEXITY: Low   GOALS: Goals reviewed with patient? Yes  SHORT TERM GOALS: Target date: 04/29/24  Patient will be independent with initial HEP.  Baseline:  Goal status: INITIAL  2.  Patient will get SI belt and be able to don and doff with some relief to back pain.  Baseline:  Goal status: INITIAL   LONG TERM GOALS: Target date: 06/10/24  Patient will be independent with advanced/ongoing HEP to improve outcomes and carryover.  Baseline:  Goal status: INITIAL  2.  Patient will report 50-75% improvement in low back pain to improve QOL.  Baseline: 9/10 Goal status: INITIAL  3.  Patient will demonstrate full pain free lumbar ROM to  perform ADLs.   Baseline:  Goal status: INITIAL  4.  Patient will be able to demonstrate core and pelvic strengthening exercises seated, on ball, and standing Baseline:  Goal status: INITIAL  PLAN:  PT FREQUENCY: 1x/week  PT DURATION: 12 weeks  PLANNED INTERVENTIONS: 97110-Therapeutic exercises, 97530- Therapeutic activity, [id number]- Neuromuscular re-education, 97535- Self Care, [id number]- Manual therapy, and Patient/Family education.  PLAN FOR NEXT SESSION: avoid exercises flat on back, core and hip strengthening    Almetta Fam, PT 03/18/2024, 5:13 PM

## 2024-03-18 ENCOUNTER — Ambulatory Visit: Attending: Physician Assistant

## 2024-03-18 DIAGNOSIS — M545 Low back pain, unspecified: Secondary | ICD-10-CM | POA: Insufficient documentation

## 2024-03-18 DIAGNOSIS — O26892 Other specified pregnancy related conditions, second trimester: Secondary | ICD-10-CM | POA: Insufficient documentation

## 2024-03-18 DIAGNOSIS — Z3A16 16 weeks gestation of pregnancy: Secondary | ICD-10-CM | POA: Diagnosis not present

## 2024-03-18 DIAGNOSIS — M5459 Other low back pain: Secondary | ICD-10-CM | POA: Insufficient documentation

## 2024-03-18 DIAGNOSIS — Z3480 Encounter for supervision of other normal pregnancy, unspecified trimester: Secondary | ICD-10-CM | POA: Insufficient documentation

## 2024-03-29 ENCOUNTER — Other Ambulatory Visit: Payer: Self-pay

## 2024-03-29 DIAGNOSIS — O219 Vomiting of pregnancy, unspecified: Secondary | ICD-10-CM

## 2024-03-29 MED ORDER — ONDANSETRON 4 MG PO TBDP: 4.0000 mg | ORAL_TABLET | Freq: Four times a day (QID) | ORAL | 0 refills | Status: DC | PRN

## 2024-03-30 ENCOUNTER — Ambulatory Visit: Attending: Advanced Practice Midwife

## 2024-03-30 ENCOUNTER — Other Ambulatory Visit: Payer: Self-pay | Admitting: Obstetrics and Gynecology

## 2024-03-30 NOTE — Therapy (Incomplete)
 OUTPATIENT PHYSICAL THERAPY THORACOLUMBAR EVALUATION   Patient Name: Molly Rose MRN: [id number] DOB:06-Jun-2000, 23 y.o., female Today's Date: 03/30/2024  END OF SESSION:    Past Medical History:  Diagnosis Date   Cholestasis during pregnancy    Complicated migraine 09/18/2015   Migraine    UTI (urinary tract infection)    Past Surgical History:  Procedure Laterality Date   WISDOM TOOTH EXTRACTION     Patient Active Problem List   Diagnosis Date Noted   History of severe pre-eclampsia 03/10/2024   UTI in pregnancy, antepartum, second trimester 03/02/2024   Supervision of other normal pregnancy, antepartum 01/14/2024   History of cholestasis during pregnancy 02/06/2022   Carrier of spinal muscular atrophy 07/14/2021   Episodic tension-type headache 09/18/2015    PCP: Olam Kirby-Leftwich  REFERRING PROVIDER: Jorene Moats  REFERRING DIAG:  Z34.80 (ICD-10-CM) - Supervision of other normal pregnancy, antepartum    Rationale for Evaluation and Treatment: Rehabilitation  THERAPY DIAG:  No diagnosis found.  ONSET DATE: 02/12/24  SUBJECTIVE:                                                                                                                                                                                           SUBJECTIVE STATEMENT: Good, I am having some pain in the back. The doctor told me it was going to get worse as the baby grows. This is my second baby, I did not have issues the first time around.    PERTINENT HISTORY:  Second trimester of pregnancy- 16 weeks   PAIN:  Are you having pain? Yes: NPRS scale: 9/10 Pain location: across low back Pain description: constant, ache  Aggravating factors: I feel it more when I am laying down and trying to relax,  Relieving factors: nothing  PRECAUTIONS: Other: pregnancy  RED FLAGS: None   WEIGHT BEARING RESTRICTIONS: No  FALLS:  Has patient fallen in last 6 months? No  LIVING  ENVIRONMENT: Lives with: lives with their family Lives in: House/apartment  OCCUPATION: Work at [employer], mostly sitting at desk   PLOF: Independent  PATIENT GOALS: find ways to help with my back pain   NEXT MD VISIT: 04/08/24  OBJECTIVE:  Note: Objective measures were completed at Evaluation unless otherwise noted.  DIAGNOSTIC FINDINGS:    PATIENT SURVEYS:  Modified Oswestry 17/50  COGNITION: Overall cognitive status: Within functional limits for tasks assessed     SENSATION: WFL  POSTURE: increased lumbar lordosis and this is due to pregnancy   LUMBAR ROM:   AROM eval  Flexion 75% with pain  Extension 75% with pain  Right lateral flexion 75% with pain  Left lateral flexion WFL  Right rotation WFL  Left rotation WFL   (Blank rows = not tested)  LOWER EXTREMITY ROM:   grossly WFL    LOWER EXTREMITY MMT:  grossly 5/5   FUNCTIONAL TESTS:  5 times sit to stand: 12.30s    TREATMENT DATE: 03/30/24 NuStep Shoulder ext 5# AR press 5#  Seated row blackTB ext blackTB flexion On dyna disc 4 way movements  Marching on dyna disc   03/18/24- EVAL                                                                                                                                PATIENT EDUCATION:  Education details: POC, HEP, education on SI belt and safe exercises Person educated: Patient Education method: Explanation Education comprehension: verbalized understanding  HOME EXERCISE PROGRAM: Access Code: [email or web address] URL: [email or web address] Date: 03/18/2024 Prepared by: Almetta Fam  Exercises - Seated Pelvic Tilt  - 1 x daily - 7 x weekly - 2 sets - 10 reps - Cat Cow  - 1 x daily - 7 x weekly - 2 sets - 10 reps - Squat with Chair Touch  - 1 x daily - 7 x weekly - 2 sets - 10 reps - Hip Abduction with Resistance Loop  - 1 x daily - 7 x weekly - 2 sets - 10 reps - Hip Extension with Resistance Loop  - 1 x daily - 7 x weekly - 2 sets - 10  reps  ASSESSMENT:  CLINICAL IMPRESSION: Patient is a 23 y.o. female who was seen today for physical therapy evaluation and treatment for back pain in pregnancy. She is in her second trimester and currently at 17 weeks. We discussed looking into SIJ/pregnancy belts to help with pressure offloading. Also went over some exercises she can do safely to work on core and LE strengthening to help support her low back. She has a physioball at home but needs to pump it up. Patient is sitting most of the day at work. She reports when she was pregnant with her daughter she did not have these issues but she was also more active and worked two jobs that required more walking and standing. She does have some limitations with ROM in her low back. We are aware that as the baby grows, her back pain will mostly likely increase but we will work on safe exercises to reduce the pain and allow the remainder of her pregnancy to progress with ease.  OBJECTIVE IMPAIRMENTS: decreased ROM and pain.   ACTIVITY LIMITATIONS: lifting, bending, and locomotion level  PERSONAL FACTORS: pregnancy are also affecting patient's functional outcome.   REHAB POTENTIAL: Good  CLINICAL DECISION MAKING: Stable/uncomplicated  EVALUATION COMPLEXITY: Low   GOALS: Goals reviewed with patient? Yes  SHORT TERM GOALS: Target date: 04/29/24  Patient will be independent with initial HEP.  Baseline:  Goal status: INITIAL  2.  Patient will get SI belt and be able  to don and doff with some relief to back pain.  Baseline:  Goal status: INITIAL   LONG TERM GOALS: Target date: 06/10/24  Patient will be independent with advanced/ongoing HEP to improve outcomes and carryover.  Baseline:  Goal status: INITIAL  2.  Patient will report 50-75% improvement in low back pain to improve QOL.  Baseline: 9/10 Goal status: INITIAL  3.  Patient will demonstrate full pain free lumbar ROM to perform ADLs.   Baseline:  Goal status: INITIAL  4.   Patient will be able to demonstrate core and pelvic strengthening exercises seated, on ball, and standing Baseline:  Goal status: INITIAL  PLAN:  PT FREQUENCY: 1x/week  PT DURATION: 12 weeks  PLANNED INTERVENTIONS: 97110-Therapeutic exercises, 97530- Therapeutic activity, [id number]- Neuromuscular re-education, 97535- Self Care, 02859- Manual therapy, and Patient/Family education.  PLAN FOR NEXT SESSION: avoid exercises flat on back, core and hip strengthening    Almetta Fam, PT 03/30/2024, 12:01 PM

## 2024-03-31 ENCOUNTER — Ambulatory Visit: Attending: Obstetrics and Gynecology | Admitting: Maternal & Fetal Medicine

## 2024-03-31 ENCOUNTER — Other Ambulatory Visit: Payer: Self-pay | Admitting: *Deleted

## 2024-03-31 ENCOUNTER — Ambulatory Visit (HOSPITAL_BASED_OUTPATIENT_CLINIC_OR_DEPARTMENT_OTHER)

## 2024-03-31 VITALS — BP 115/52 | HR 75

## 2024-03-31 DIAGNOSIS — O09292 Supervision of pregnancy with other poor reproductive or obstetric history, second trimester: Secondary | ICD-10-CM | POA: Diagnosis present

## 2024-03-31 DIAGNOSIS — Z148 Genetic carrier of other disease: Secondary | ICD-10-CM | POA: Insufficient documentation

## 2024-03-31 DIAGNOSIS — Z8719 Personal history of other diseases of the digestive system: Secondary | ICD-10-CM

## 2024-03-31 DIAGNOSIS — Z3480 Encounter for supervision of other normal pregnancy, unspecified trimester: Secondary | ICD-10-CM | POA: Diagnosis not present

## 2024-03-31 DIAGNOSIS — Z363 Encounter for antenatal screening for malformations: Secondary | ICD-10-CM | POA: Insufficient documentation

## 2024-03-31 DIAGNOSIS — Z3A18 18 weeks gestation of pregnancy: Secondary | ICD-10-CM | POA: Insufficient documentation

## 2024-03-31 DIAGNOSIS — Z8759 Personal history of other complications of pregnancy, childbirth and the puerperium: Secondary | ICD-10-CM

## 2024-03-31 DIAGNOSIS — Z3A19 19 weeks gestation of pregnancy: Secondary | ICD-10-CM

## 2024-03-31 NOTE — Progress Notes (Signed)
 Patient information  Patient Name: Molly Rose  Patient MRN:   [id number]  Referring practice: [facility]FM Referring Provider: [place] [facility]  Problem List   Patient Active Problem List   Diagnosis Date Noted   History of severe pre-eclampsia 03/10/2024   UTI in pregnancy, antepartum, second trimester 03/02/2024   Supervision of other normal pregnancy, antepartum 01/14/2024   History of cholestasis during pregnancy 02/06/2022   Carrier of spinal muscular atrophy 07/14/2021   Episodic tension-type headache 09/18/2015   Maternal Fetal Medicine Consult Molly Rose is a 23 y.o. G2P1001 at [redacted]w[redacted]d here for ultrasound and consultation. She had low risk aneuploidy screening of a female fetus. Carrier screening was elevated risk to be a carrier. Maternal serum AFP was negative. She has no acute concerns.   Today we focused on the following:   History of preE: Patient was diagnosed with preeclampsia around 37 weeks in her previous pregnancy.  I discussed the importance of taking 81 mg of aspirin  for preeclampsia risk reduction.  The patient has not started this yet but will.  If she has not had baseline labs that should also be done  History of cholestasis: Patient was delivered early for this reason around 37 weeks.  I discussed the likelihood of occurrence which is about 60 to 70% she will continue to monitor her symptoms and a growth ultrasound will be done at 28 weeks.  History of circumvallate placenta: This is typically isolated and there is no evidence of this on today's ultrasound.  The patient is an increased risk to be a 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:  I discussed that this is a serious medical condition but that the patient is unlikely to be a true carrier and that the father of the baby should be tested if they want to know the risk to the fetus based on their carrier screening results.  Alternatively I discussed the role of amniocentesis which the patient declined both at this  time.  Counseling I discussed the potential obstetric, fetal and neonatal complications specific to this patients pregnancy.  I discussed the role of antenatal testing, serial growth ultrasounds and the timing of delivery based on the clinical course. I reassured the patient that we expect a favorable pregnancy outcome but due to her pregnancy complications she will need a higher level of monitoring for her pregnancy compared to a pregnancy without complications. The patient had time to ask questions that were answered to her satisfaction. She verbalized understanding of our discussion and agreed to proceed with the plan outlined in the recommendations.   Recommendations - EDD is 08/27/24 based on the best early CRL due to an LMP that was approximate.  - Aneuploidy screening was completed at the [facility] office - Anatomy ultrasound was done today with the above findings (see report). - Aspirin  81-162 mg continued throughout the pregnancy for preeclampsia prophylaxis. - Baseline labs: CMP, CBC, urine protein creatinine ratio if not previously completed. Repeat with any concerns for preeclampsia. - Blood pressure goal of < 140 systolic and < 90 diastolic. Antihypertensive medication should be added/adjusted until BP goal is achieved.  - 28 week growth US   Review of Systems: A review of systems was performed and was negative except per HPI   Past Obstetrical History:  OB History  Gravida Para Term Preterm AB Living  2 1 1  0 0 1  SAB IAB Ectopic Multiple Live Births  0 0 0 0 1    # Outcome Date GA Lbr Len/2nd  Weight Sex Type Anes PTL Lv  2 Current           1 Term 12/21/21 [redacted]w[redacted]d 10:44 / 04:14 6 lb 6.7 oz (2.91 kg) F Vag-Spont EPI  LIV     Past Medical History:  Past Medical History:  Diagnosis Date   Cholestasis during pregnancy    Complicated migraine 09/18/2015   Migraine    UTI (urinary tract infection)      Past Surgical History:    Past Surgical History:  Procedure Laterality Date    WISDOM TOOTH EXTRACTION       Home Medications:   Current Outpatient Medications on File Prior to Visit  Medication Sig Dispense Refill   ondansetron  (ZOFRAN -ODT) 4 MG disintegrating tablet Take 1 tablet (4 mg total) by mouth every 6 (six) hours as needed for nausea. 20 tablet 0   Prenatal Vit-Fe Fumarate-FA (PREPLUS) 27-1 MG TABS Take 1 tablet by mouth daily. 30 tablet 13   aspirin  EC 81 MG tablet Take 1 tablet (81 mg total) by mouth at bedtime. Start taking when you are [redacted] weeks pregnant for rest of pregnancy for prevention of preeclampsia (Patient not taking: Reported on 03/10/2024) 300 tablet 2   Blood Pressure Monitoring (BLOOD PRESSURE KIT) DEVI 1 Device by Does not apply route once a week. 1 each 0   metroNIDAZOLE  (METROGEL ) 0.75 % vaginal gel Place 1 Applicatorful vaginally at bedtime. Apply one applicatorful to vagina at bedtime for 5 days (Patient not taking: Reported on 03/10/2024) 70 g 1   nitrofurantoin , macrocrystal-monohydrate, (MACROBID ) 100 MG capsule Take 1 capsule (100 mg total) by mouth 2 (two) times daily. (Patient not taking: Reported on 03/31/2024) 14 capsule 1   promethazine  (PHENERGAN ) 25 MG tablet Take 1 tablet (25 mg total) by mouth every 6 (six) hours as needed for nausea. (Patient not taking: Reported on 02/12/2024) 30 tablet 0   No current facility-administered medications on file prior to visit.      Allergies:   Allergies  Allergen Reactions   Rocephin  [Ceftriaxone ] Anaphylaxis    See MAU note from 07/25/2021. Hives on face, anaphylaxis reaction with drop in O2 saturation.    Other     Seasonal Allergies   Shellfish Allergy      Physical Exam:   Vitals:   03/31/24 1416  BP: (!) 115/52  Pulse: 75   Sitting comfortably on the sonogram table Nonlabored breathing Normal rate and rhythm Abdomen is nontender  Thank you for the opportunity to be involved with this patient's care. Please let us  know if we can be of any further assistance.   45 minutes of  time was spent reviewing the patient's chart including labs, imaging and documentation.  At least 50% of this time was spent with direct patient care discussing the diagnosis, management and prognosis of her care.  Delora Smaller MFM, [facility]   03/31/2024  3:04 PM

## 2024-04-01 ENCOUNTER — Ambulatory Visit: Payer: Self-pay | Admitting: Obstetrics and Gynecology

## 2024-04-05 NOTE — Therapy (Incomplete)
 OUTPATIENT PHYSICAL THERAPY THORACOLUMBAR EVALUATION   Patient Name: Molly Rose MRN: [id number] DOB:Sep 24, 2000, 23 y.o., female Today's Date: 04/05/2024  END OF SESSION:    Past Medical History:  Diagnosis Date   Cholestasis during pregnancy    Complicated migraine 09/18/2015   Migraine    UTI (urinary tract infection)    Past Surgical History:  Procedure Laterality Date   WISDOM TOOTH EXTRACTION     Patient Active Problem List   Diagnosis Date Noted   History of severe pre-eclampsia 03/10/2024   UTI in pregnancy, antepartum, second trimester 03/02/2024   Supervision of other normal pregnancy, antepartum 01/14/2024   History of cholestasis during pregnancy 02/06/2022   Carrier of spinal muscular atrophy 07/14/2021   Episodic tension-type headache 09/18/2015    PCP: Olam Kirby-Leftwich  REFERRING PROVIDER: Jorene Moats  REFERRING DIAG:  Z34.80 (ICD-10-CM) - Supervision of other normal pregnancy, antepartum    Rationale for Evaluation and Treatment: Rehabilitation  THERAPY DIAG:  No diagnosis found.  ONSET DATE: 02/12/24  SUBJECTIVE:                                                                                                                                                                                           SUBJECTIVE STATEMENT: Good, I am having some pain in the back. The doctor told me it was going to get worse as the baby grows. This is my second baby, I did not have issues the first time around.    PERTINENT HISTORY:  Second trimester of pregnancy- 16 weeks   PAIN:  Are you having pain? Yes: NPRS scale: 9/10 Pain location: across low back Pain description: constant, ache  Aggravating factors: I feel it more when I am laying down and trying to relax,  Relieving factors: nothing  PRECAUTIONS: Other: pregnancy  RED FLAGS: None   WEIGHT BEARING RESTRICTIONS: No  FALLS:  Has patient fallen in last 6 months? No  LIVING  ENVIRONMENT: Lives with: lives with their family Lives in: House/apartment  OCCUPATION: Work at [employer], mostly sitting at desk   PLOF: Independent  PATIENT GOALS: find ways to help with my back pain   NEXT MD VISIT: 04/08/24  OBJECTIVE:  Note: Objective measures were completed at Evaluation unless otherwise noted.  DIAGNOSTIC FINDINGS:    PATIENT SURVEYS:  Modified Oswestry 17/50  COGNITION: Overall cognitive status: Within functional limits for tasks assessed     SENSATION: WFL  POSTURE: increased lumbar lordosis and this is due to pregnancy   LUMBAR ROM:   AROM eval  Flexion 75% with pain  Extension 75% with pain  Right lateral flexion 75% with pain  Left lateral flexion WFL  Right rotation WFL  Left rotation WFL   (Blank rows = not tested)  LOWER EXTREMITY ROM:   grossly WFL    LOWER EXTREMITY MMT:  grossly 5/5   FUNCTIONAL TESTS:  5 times sit to stand: 12.30s    TREATMENT DATE: 04/06/24 NuStep Shoulder ext 5# AR press 5#  Seated row blackTB ext blackTB flexion On dyna disc 4 way movements  Marching on dyna disc   03/18/24- EVAL                                                                                                                                PATIENT EDUCATION:  Education details: POC, HEP, education on SI belt and safe exercises Person educated: Patient Education method: Explanation Education comprehension: verbalized understanding  HOME EXERCISE PROGRAM: Access Code: [email or web address] URL: [email or web address] Date: 03/18/2024 Prepared by: Almetta Fam  Exercises - Seated Pelvic Tilt  - 1 x daily - 7 x weekly - 2 sets - 10 reps - Cat Cow  - 1 x daily - 7 x weekly - 2 sets - 10 reps - Squat with Chair Touch  - 1 x daily - 7 x weekly - 2 sets - 10 reps - Hip Abduction with Resistance Loop  - 1 x daily - 7 x weekly - 2 sets - 10 reps - Hip Extension with Resistance Loop  - 1 x daily - 7 x weekly - 2 sets - 10  reps  ASSESSMENT:  CLINICAL IMPRESSION: Patient is a 23 y.o. female who was seen today for physical therapy evaluation and treatment for back pain in pregnancy. She is in her second trimester and currently at 17 weeks. We discussed looking into SIJ/pregnancy belts to help with pressure offloading. Also went over some exercises she can do safely to work on core and LE strengthening to help support her low back. She has a physioball at home but needs to pump it up. Patient is sitting most of the day at work. She reports when she was pregnant with her daughter she did not have these issues but she was also more active and worked two jobs that required more walking and standing. She does have some limitations with ROM in her low back. We are aware that as the baby grows, her back pain will mostly likely increase but we will work on safe exercises to reduce the pain and allow the remainder of her pregnancy to progress with ease.  OBJECTIVE IMPAIRMENTS: decreased ROM and pain.   ACTIVITY LIMITATIONS: lifting, bending, and locomotion level  PERSONAL FACTORS: pregnancy are also affecting patient's functional outcome.   REHAB POTENTIAL: Good  CLINICAL DECISION MAKING: Stable/uncomplicated  EVALUATION COMPLEXITY: Low   GOALS: Goals reviewed with patient? Yes  SHORT TERM GOALS: Target date: 04/29/24  Patient will be independent with initial HEP.  Baseline:  Goal status: INITIAL  2.  Patient will get SI belt and be able  to don and doff with some relief to back pain.  Baseline:  Goal status: INITIAL   LONG TERM GOALS: Target date: 06/10/24  Patient will be independent with advanced/ongoing HEP to improve outcomes and carryover.  Baseline:  Goal status: INITIAL  2.  Patient will report 50-75% improvement in low back pain to improve QOL.  Baseline: 9/10 Goal status: INITIAL  3.  Patient will demonstrate full pain free lumbar ROM to perform ADLs.   Baseline:  Goal status: INITIAL  4.   Patient will be able to demonstrate core and pelvic strengthening exercises seated, on ball, and standing Baseline:  Goal status: INITIAL  PLAN:  PT FREQUENCY: 1x/week  PT DURATION: 12 weeks  PLANNED INTERVENTIONS: 97110-Therapeutic exercises, 97530- Therapeutic activity, [id number]- Neuromuscular re-education, 97535- Self Care, [id number]- Manual therapy, and Patient/Family education.  PLAN FOR NEXT SESSION: avoid exercises flat on back, core and hip strengthening    Almetta Fam, PT 04/05/2024, 2:53 PM

## 2024-04-06 ENCOUNTER — Ambulatory Visit

## 2024-04-08 ENCOUNTER — Encounter: Payer: Self-pay | Admitting: Physician Assistant

## 2024-04-08 ENCOUNTER — Ambulatory Visit (INDEPENDENT_AMBULATORY_CARE_PROVIDER_SITE_OTHER): Admitting: Physician Assistant

## 2024-04-08 VITALS — BP 97/60 | HR 78 | Wt 149.2 lb

## 2024-04-08 DIAGNOSIS — Z3482 Encounter for supervision of other normal pregnancy, second trimester: Secondary | ICD-10-CM | POA: Diagnosis not present

## 2024-04-08 DIAGNOSIS — Z3A19 19 weeks gestation of pregnancy: Secondary | ICD-10-CM | POA: Diagnosis not present

## 2024-04-08 DIAGNOSIS — Z8759 Personal history of other complications of pregnancy, childbirth and the puerperium: Secondary | ICD-10-CM | POA: Diagnosis not present

## 2024-04-08 DIAGNOSIS — R112 Nausea with vomiting, unspecified: Secondary | ICD-10-CM | POA: Diagnosis not present

## 2024-04-08 DIAGNOSIS — Z3480 Encounter for supervision of other normal pregnancy, unspecified trimester: Secondary | ICD-10-CM

## 2024-04-08 MED ORDER — DOXYLAMINE-PYRIDOXINE 10-10 MG PO TBEC: 2.0000 | DELAYED_RELEASE_TABLET | Freq: Every day | ORAL | 5 refills | Status: DC

## 2024-04-08 NOTE — Progress Notes (Signed)
 BP 101/67 and 97/58. FHR @ 145  Would like to discuss other meds for nausea, she feels the zofran  isn't helping.

## 2024-04-08 NOTE — Progress Notes (Signed)
 PRENATAL VISIT NOTE  Subjective:  Molly Rose is a 23 y.o. G2P1001 at [redacted]w[redacted]d being seen today for ongoing prenatal care.  She is currently monitored for the following issues for this high-risk pregnancy and has Episodic tension-type headache; Carrier of spinal muscular atrophy; History of cholestasis during pregnancy; Supervision of other normal pregnancy, antepartum; UTI in pregnancy, antepartum, second trimester; and History of severe pre-eclampsia on their problem list.  Patient reports continued nausea and vomiting.  Contractions: Not present. Vag. Bleeding: None.  Movement: Present. Denies leaking of fluid.   The following portions of the patient's history were reviewed and updated as appropriate: allergies, current medications, past family history, past medical history, past social history, past surgical history and problem list.   Objective:   Vitals:   04/08/24 1633 04/08/24 1638  BP: (!) 101/57 97/60  Pulse: 77 78  Weight: 149 lb 3.2 oz (67.7 kg)     Fetal Status:  Fetal Heart Rate (bpm): 145 Fundal Height: 19 cm Movement: Present    General: Alert, oriented and cooperative. Patient is in no acute distress.  Skin: Skin is warm and dry. No rash noted.   Cardiovascular: Normal heart rate noted  Respiratory: Normal respiratory effort, no problems with respiration noted  Abdomen: Soft, gravid, appropriate for gestational age.  Pain/Pressure: Present     Pelvic: Cervical exam deferred        Extremities: Normal range of motion.  Edema: None  Mental Status: Normal mood and affect. Normal behavior. Normal judgment and thought content.      01/14/2024   10:46 AM 12/17/2021    1:37 PM 10/23/2021    9:32 AM  Depression screen PHQ 2/9  Decreased Interest 0 0 2  Down, Depressed, Hopeless 0 0 0  PHQ - 2 Score 0 0 2  Altered sleeping 0 0 1  Tired, decreased energy 0 1 2  Change in appetite 0 0 0  Feeling bad or failure about yourself  0 0 0  Trouble concentrating 0 0 0   Moving slowly or fidgety/restless 0 0 0  Suicidal thoughts 0 0 0  PHQ-9 Score 0  1  5   Difficult doing work/chores  Not difficult at all      Data saved with a previous flowsheet row definition        01/14/2024   10:47 AM 10/23/2021    9:32 AM 06/22/2021    9:32 AM  GAD 7 : Generalized Anxiety Score  Nervous, Anxious, on Edge 0 0 0  Control/stop worrying 0 0 0  Worry too much - different things 0 0 0  Trouble relaxing 0 0 0  Restless 0 0 0  Easily annoyed or irritable 0 0 0  Afraid - awful might happen 0 1 0  Total GAD 7 Score 0 1 0  Anxiety Difficulty   Not difficult at all    Assessment and Plan:  Pregnancy: G2P1001 at [redacted]w[redacted]d  1. Supervision of other normal pregnancy, antepartum Patient doing well BP, FHR, FH appropriate   2. [redacted] weeks gestation of pregnancy Anticipatory guidance about next visits/weeks of pregnancy given.   3.  Nausea and vomiting, unspecified vomiting type (Primary) Patient well-appearing today with moist mucous membranes. Patient unable to get labs, as lab was closed. Will start diclegis . Advised to come back in for labs as soon as possible. Precautions give for presenting to [facility].  - Doxylamine -Pyridoxine  (DICLEGIS ) 10-10 MG TBEC; Take 2 tablets by mouth at bedtime. If symptoms persist,  add one tablet in the morning and one in the afternoon  Dispense: 100 tablet; Refill: 5 - Comp Met (CMET) - Magnesium   4. History of severe pre-eclampsia Continue ASA  Preterm labor symptoms and general obstetric precautions including but not limited to vaginal bleeding, contractions, leaking of fluid and fetal movement were reviewed in detail with the patient.  Please refer to After Visit Summary for other counseling recommendations.   Return in about 4 weeks (around 05/06/2024) for LOB.  Future Appointments  Date Time Provider Department Center  05/06/2024 11:15 AM Terreon Ekholm E, PA-C [facility] None  06/03/2024  2:15 PM [facility] PROVIDER 1 [facility]  06/03/2024   2:30 PM [facility] US4 [facility]    Jorene FORBES Moats, PA-C

## 2024-04-14 ENCOUNTER — Ambulatory Visit

## 2024-05-06 ENCOUNTER — Encounter: Payer: Self-pay | Admitting: Physician Assistant

## 2024-05-06 ENCOUNTER — Ambulatory Visit (INDEPENDENT_AMBULATORY_CARE_PROVIDER_SITE_OTHER): Admitting: Physician Assistant

## 2024-05-06 VITALS — BP 95/56 | HR 72 | Wt 152.2 lb

## 2024-05-06 DIAGNOSIS — Z3480 Encounter for supervision of other normal pregnancy, unspecified trimester: Secondary | ICD-10-CM

## 2024-05-06 DIAGNOSIS — L299 Pruritus, unspecified: Secondary | ICD-10-CM

## 2024-05-06 DIAGNOSIS — O2342 Unspecified infection of urinary tract in pregnancy, second trimester: Secondary | ICD-10-CM | POA: Diagnosis not present

## 2024-05-06 DIAGNOSIS — Z8759 Personal history of other complications of pregnancy, childbirth and the puerperium: Secondary | ICD-10-CM | POA: Diagnosis not present

## 2024-05-06 DIAGNOSIS — Z3A23 23 weeks gestation of pregnancy: Secondary | ICD-10-CM | POA: Diagnosis not present

## 2024-05-06 NOTE — Progress Notes (Signed)
 PRENATAL VISIT NOTE  Subjective:  Molly Rose is a 23 y.o. G2P1001 at [redacted]w[redacted]d being seen today for ongoing prenatal care.  She is currently monitored for the following issues for this high-risk pregnancy and has Episodic tension-type headache; Carrier of spinal muscular atrophy; History of cholestasis during pregnancy; Supervision of other normal pregnancy, antepartum; UTI in pregnancy, antepartum, second trimester; and History of severe pre-eclampsia on their problem list.  Patient reports no complaints. Nausea and vomiting relieved without medication.  Contractions: Not present. Vag. Bleeding: None.  Movement: Present. Denies leaking of fluid.   The following portions of the patient's history were reviewed and updated as appropriate: allergies, current medications, past family history, past medical history, past social history, past surgical history and problem list.   Objective:   Vitals:   05/06/24 1118  BP: (!) 95/56  Pulse: 72  Weight: 152 lb 3.2 oz (69 kg)    Fetal Status:  Fetal Heart Rate (bpm): 140 Fundal Height: 26 cm Movement: Present    General: Alert, oriented and cooperative. Patient is in no acute distress.  Skin: Skin is warm and dry. No rash noted.   Cardiovascular: Normal heart rate noted  Respiratory: Normal respiratory effort, no problems with respiration noted  Abdomen: Soft, gravid, appropriate for gestational age.  Pain/Pressure: Present     Pelvic: Cervical exam deferred        Extremities: Normal range of motion.  Edema: None  Mental Status: Normal mood and affect. Normal behavior. Normal judgment and thought content.      01/14/2024   10:46 AM 12/17/2021    1:37 PM 10/23/2021    9:32 AM  Depression screen PHQ 2/9  Decreased Interest 0 0 2  Down, Depressed, Hopeless 0 0 0  PHQ - 2 Score 0 0 2  Altered sleeping 0 0 1  Tired, decreased energy 0 1 2  Change in appetite 0 0 0  Feeling bad or failure about yourself  0 0 0  Trouble concentrating 0 0  0  Moving slowly or fidgety/restless 0 0 0  Suicidal thoughts 0 0 0  PHQ-9 Score 0  1  5   Difficult doing work/chores  Not difficult at all      Data saved with a previous flowsheet row definition        01/14/2024   10:47 AM 10/23/2021    9:32 AM 06/22/2021    9:32 AM  GAD 7 : Generalized Anxiety Score  Nervous, Anxious, on Edge 0 0 0  Control/stop worrying 0 0 0  Worry too much - different things 0 0 0  Trouble relaxing 0 0 0  Restless 0 0 0  Easily annoyed or irritable 0 0 0  Afraid - awful might happen 0 1 0  Total GAD 7 Score 0 1 0  Anxiety Difficulty   Not difficult at all    Assessment and Plan:  Pregnancy: G2P1001 at [redacted]w[redacted]d  1. Supervision of other normal pregnancy, antepartum (Primary) Patient doing well, feeling regular fetal movement BP, FHR, FH appropriate   2. [redacted] weeks gestation of pregnancy Anticipatory guidance about next visits/weeks of pregnancy given.   3. UTI in pregnancy, antepartum, second trimester TOC today  4. History of severe pre-eclampsia Normotensive Continue ASA  5. Itching Isolated pruritus on heels of the feet with history of cholestasis of pregnancy - Bile acids, total - Comp Met (CMET)   Preterm labor symptoms and general obstetric precautions including but not limited to vaginal bleeding,  contractions, leaking of fluid and fetal movement were reviewed in detail with the patient.  Please refer to After Visit Summary for other counseling recommendations.   Return in about 2 weeks (around 05/20/2024) for LOB+GTT.  Future Appointments  Date Time Provider Department Center  06/03/2024  2:15 PM [facility] PROVIDER 1 [facility]  06/03/2024  2:30 PM [facility] US4 [facility]    Jorene FORBES Moats, PA-C

## 2024-05-06 NOTE — Progress Notes (Signed)
 Pt presents for ROB visit. No concerns

## 2024-05-08 LAB — COMPREHENSIVE METABOLIC PANEL WITH GFR
ALT: 12 IU/L (ref 0–32)
AST: 15 IU/L (ref 0–40)
Albumin: 3.9 g/dL — ABNORMAL LOW (ref 4.0–5.0)
Alkaline Phosphatase: 73 IU/L (ref 41–116)
BUN/Creatinine Ratio: 13 (ref 9–23)
BUN: 6 mg/dL (ref 6–20)
Bilirubin Total: 0.4 mg/dL (ref 0.0–1.2)
CO2: 18 mmol/L — ABNORMAL LOW (ref 20–29)
Calcium: 8.4 mg/dL — ABNORMAL LOW (ref 8.7–10.2)
Chloride: 105 mmol/L (ref 96–106)
Creatinine, Ser: 0.48 mg/dL — ABNORMAL LOW (ref 0.57–1.00)
Globulin, Total: 1.7 g/dL (ref 1.5–4.5)
Glucose: 69 mg/dL — ABNORMAL LOW (ref 70–99)
Potassium: 4.2 mmol/L (ref 3.5–5.2)
Sodium: 138 mmol/L (ref 134–144)
Total Protein: 5.6 g/dL — ABNORMAL LOW (ref 6.0–8.5)
eGFR: 136 mL/min/1.73 (ref >59)

## 2024-05-08 LAB — BILE ACIDS, TOTAL: Bile Acids Total: 4 umol/L (ref 0.0–10.0)

## 2024-05-10 ENCOUNTER — Ambulatory Visit (HOSPITAL_COMMUNITY): Payer: Self-pay | Admitting: Physician Assistant

## 2024-05-24 ENCOUNTER — Encounter: Payer: Self-pay | Admitting: Obstetrics

## 2024-05-24 ENCOUNTER — Ambulatory Visit (INDEPENDENT_AMBULATORY_CARE_PROVIDER_SITE_OTHER): Payer: Self-pay | Admitting: Obstetrics

## 2024-05-24 ENCOUNTER — Other Ambulatory Visit (HOSPITAL_COMMUNITY)
Admission: RE | Admit: 2024-05-24 | Discharge: 2024-05-24 | Disposition: A | Discharge status: Home / Self Care | Source: Ambulatory Visit | Attending: Obstetrics | Admitting: Obstetrics

## 2024-05-24 ENCOUNTER — Other Ambulatory Visit: Payer: Self-pay

## 2024-05-24 VITALS — BP 108/63 | HR 75 | Wt 152.0 lb

## 2024-05-24 DIAGNOSIS — Z3A26 26 weeks gestation of pregnancy: Secondary | ICD-10-CM | POA: Diagnosis not present

## 2024-05-24 DIAGNOSIS — O99713 Diseases of the skin and subcutaneous tissue complicating pregnancy, third trimester: Secondary | ICD-10-CM | POA: Insufficient documentation

## 2024-05-24 DIAGNOSIS — O0992 Supervision of high risk pregnancy, unspecified, second trimester: Secondary | ICD-10-CM

## 2024-05-24 DIAGNOSIS — O2342 Unspecified infection of urinary tract in pregnancy, second trimester: Secondary | ICD-10-CM

## 2024-05-24 DIAGNOSIS — Z148 Genetic carrier of other disease: Secondary | ICD-10-CM | POA: Diagnosis not present

## 2024-05-24 DIAGNOSIS — Z8759 Personal history of other complications of pregnancy, childbirth and the puerperium: Secondary | ICD-10-CM

## 2024-05-24 DIAGNOSIS — O99712 Diseases of the skin and subcutaneous tissue complicating pregnancy, second trimester: Secondary | ICD-10-CM | POA: Diagnosis not present

## 2024-05-24 DIAGNOSIS — L299 Pruritus, unspecified: Secondary | ICD-10-CM

## 2024-05-24 DIAGNOSIS — Z8719 Personal history of other diseases of the digestive system: Secondary | ICD-10-CM

## 2024-05-24 DIAGNOSIS — N898 Other specified noninflammatory disorders of vagina: Secondary | ICD-10-CM | POA: Diagnosis not present

## 2024-05-24 DIAGNOSIS — O0990 Supervision of high risk pregnancy, unspecified, unspecified trimester: Secondary | ICD-10-CM

## 2024-05-24 MED ORDER — URSODIOL 300 MG PO CAPS: 300.0000 mg | ORAL_CAPSULE | Freq: Two times a day (BID) | ORAL | 5 refills | Status: AC

## 2024-05-24 NOTE — Progress Notes (Unsigned)
 Pt presents for rob. Pt states that she is having discharge and itchiness all over but no bumps or rashes.

## 2024-05-24 NOTE — Progress Notes (Unsigned)
 Subjective:  Molly Rose is a 23 y.o. G2P1001 at [redacted]w[redacted]d being seen today for ongoing prenatal care.  She is currently monitored for the following issues for this high-risk pregnancy and has Episodic tension-type headache; Carrier of spinal muscular atrophy; History of cholestasis during pregnancy; Supervision of other normal pregnancy, antepartum; UTI in pregnancy, antepartum, second trimester; and History of severe pre-eclampsia on their problem list.  Patient reports severe itching all over.  Contractions: Not present. Vag. Bleeding: None.  Movement: Present. Denies leaking of fluid.   The following portions of the patient's history were reviewed and updated as appropriate: allergies, current medications, past family history, past medical history, past social history, past surgical history and problem list. Problem list updated.  Objective:   Vitals:   05/24/24 0949  BP: 108/63  Pulse: 75  Weight: 152 lb (68.9 kg)    Fetal Status: Fetal Heart Rate (bpm): 140   Movement: Present     General:  Alert, oriented and cooperative. Patient is in no acute distress.  Skin: Skin is warm and dry. No rash noted.   Cardiovascular: Normal heart rate noted  Respiratory: Normal respiratory effort, no problems with respiration noted  Abdomen: Soft, gravid, appropriate for gestational age. Pain/Pressure: Absent     Pelvic:  Cervical exam deferred        Extremities: Normal range of motion.  Edema: Trace  Mental Status: Normal mood and affect. Normal behavior. Normal judgment and thought content.   Urinalysis:      Assessment and Plan:  Pregnancy: G2P1001 at [redacted]w[redacted]d  1. Supervision of high risk pregnancy, antepartum (Primary) Rx: - CBC - HIV antibody (with reflex) - RPR - HIV  2. [redacted] weeks gestation of pregnancy Rx: - Glucose Tolerance, 2 Hours w/1 Hour  3. Carrier of spinal muscular atrophy  4. Vaginal discharge - Cervicovaginal ancillary only( [facility]  5. History of severe  pre-eclampsia - taking Baby ASA  6. History of cholestasis during pregnancy  7. Pruritus of pregnancy in third trimester Rx: - Bile acids, total - Comp Met (CMET) - ursodiol (ACTIGALL) 300 MG capsule; Take 1 capsule (300 mg total) by mouth 2 (two) times daily.  Dispense: 60 capsule; Refill: 5  8. UTI in pregnancy, antepartum, second trimester, treated    Preterm labor symptoms and general obstetric precautions including but not limited to vaginal bleeding, contractions, leaking of fluid and fetal movement were reviewed in detail with the patient. Please refer to After Visit Summary for other counseling recommendations.   Return in about 2 weeks (around 06/07/2024) for [facility].   Rudy Carlin LABOR, MD 05/24/2024

## 2024-05-25 LAB — COMPREHENSIVE METABOLIC PANEL WITH GFR
ALT: 14 IU/L (ref 0–32)
AST: 16 IU/L (ref 0–40)
Albumin: 4 g/dL (ref 4.0–5.0)
Alkaline Phosphatase: 92 IU/L (ref 41–116)
BUN/Creatinine Ratio: 11 (ref 9–23)
BUN: 5 mg/dL — ABNORMAL LOW (ref 6–20)
Bilirubin Total: 0.4 mg/dL (ref 0.0–1.2)
CO2: 21 mmol/L (ref 20–29)
Calcium: 8.5 mg/dL — ABNORMAL LOW (ref 8.7–10.2)
Chloride: 104 mmol/L (ref 96–106)
Creatinine, Ser: 0.44 mg/dL — ABNORMAL LOW (ref 0.57–1.00)
Globulin, Total: 2.1 g/dL (ref 1.5–4.5)
Glucose: 75 mg/dL (ref 70–99)
Potassium: 3.6 mmol/L (ref 3.5–5.2)
Sodium: 137 mmol/L (ref 134–144)
Total Protein: 6.1 g/dL (ref 6.0–8.5)
eGFR: 139 mL/min/1.73

## 2024-05-25 LAB — CERVICOVAGINAL ANCILLARY ONLY
Bacterial Vaginitis (gardnerella): NEGATIVE
Candida Glabrata: NEGATIVE
Candida Vaginitis: NEGATIVE
Chlamydia: NEGATIVE
Comment: NEGATIVE
Comment: NEGATIVE
Comment: NEGATIVE
Comment: NEGATIVE
Comment: NEGATIVE
Comment: NORMAL
Neisseria Gonorrhea: NEGATIVE
Trichomonas: NEGATIVE

## 2024-05-25 LAB — CBC
Hematocrit: 33.2 % — ABNORMAL LOW (ref 34.0–46.6)
Hemoglobin: 11 g/dL — ABNORMAL LOW (ref 11.1–15.9)
MCH: 31.2 pg (ref 26.6–33.0)
MCHC: 33.1 g/dL (ref 31.5–35.7)
MCV: 94 fL (ref 79–97)
Platelets: 244 x10E3/uL (ref 150–450)
RBC: 3.53 x10E6/uL — ABNORMAL LOW (ref 3.77–5.28)
RDW: 12.3 % (ref 11.7–15.4)
WBC: 8.3 x10E3/uL (ref 3.4–10.8)

## 2024-05-25 LAB — GLUCOSE TOLERANCE, 2 HOURS W/ 1HR
Glucose, 1 hour: 73 mg/dL (ref 70–179)
Glucose, 2 hour: 120 mg/dL (ref 70–152)
Glucose, Fasting: 85 mg/dL (ref 70–91)

## 2024-05-25 LAB — SYPHILIS: RPR W/REFLEX TO RPR TITER AND TREPONEMAL ANTIBODIES, TRADITIONAL SCREENING AND DIAGNOSIS ALGORITHM: RPR Ser Ql: NONREACTIVE

## 2024-05-25 LAB — HIV ANTIBODY (ROUTINE TESTING W REFLEX): HIV Screen 4th Generation wRfx: NONREACTIVE

## 2024-05-26 LAB — BILE ACIDS, TOTAL: Bile Acids Total: 4.7 umol/L (ref 0.0–10.0)

## 2024-05-28 ENCOUNTER — Encounter: Payer: Self-pay | Admitting: Physician Assistant

## 2024-05-31 ENCOUNTER — Encounter: Payer: Self-pay | Admitting: Obstetrics and Gynecology

## 2024-06-03 ENCOUNTER — Other Ambulatory Visit

## 2024-06-03 ENCOUNTER — Ambulatory Visit

## 2024-06-09 ENCOUNTER — Ambulatory Visit: Attending: Obstetrics and Gynecology | Admitting: Obstetrics and Gynecology

## 2024-06-09 ENCOUNTER — Ambulatory Visit (HOSPITAL_BASED_OUTPATIENT_CLINIC_OR_DEPARTMENT_OTHER)

## 2024-06-09 ENCOUNTER — Ambulatory Visit (INDEPENDENT_AMBULATORY_CARE_PROVIDER_SITE_OTHER): Payer: Self-pay | Admitting: Obstetrics & Gynecology

## 2024-06-09 VITALS — BP 100/61 | HR 89 | Wt 155.1 lb

## 2024-06-09 VITALS — BP 117/51 | HR 77

## 2024-06-09 DIAGNOSIS — E669 Obesity, unspecified: Secondary | ICD-10-CM

## 2024-06-09 DIAGNOSIS — Z362 Encounter for other antenatal screening follow-up: Secondary | ICD-10-CM | POA: Diagnosis not present

## 2024-06-09 DIAGNOSIS — Z8719 Personal history of other diseases of the digestive system: Secondary | ICD-10-CM | POA: Insufficient documentation

## 2024-06-09 DIAGNOSIS — Z3482 Encounter for supervision of other normal pregnancy, second trimester: Secondary | ICD-10-CM

## 2024-06-09 DIAGNOSIS — O09293 Supervision of pregnancy with other poor reproductive or obstetric history, third trimester: Secondary | ICD-10-CM

## 2024-06-09 DIAGNOSIS — O99213 Obesity complicating pregnancy, third trimester: Secondary | ICD-10-CM

## 2024-06-09 DIAGNOSIS — Z148 Genetic carrier of other disease: Secondary | ICD-10-CM

## 2024-06-09 DIAGNOSIS — Z3480 Encounter for supervision of other normal pregnancy, unspecified trimester: Secondary | ICD-10-CM

## 2024-06-09 DIAGNOSIS — Z3A28 28 weeks gestation of pregnancy: Secondary | ICD-10-CM

## 2024-06-09 DIAGNOSIS — Z8759 Personal history of other complications of pregnancy, childbirth and the puerperium: Secondary | ICD-10-CM | POA: Diagnosis not present

## 2024-06-09 NOTE — Progress Notes (Signed)
 After review, MFM consult with provider is not indicated for today  Arna Ranks, MD 06/09/2024 4:04 PM  [facility]

## 2024-06-09 NOTE — Progress Notes (Signed)
 ROB in office Pt reports fetal movement, states no concerns today Pt requests tdap to be done at next visit

## 2024-06-09 NOTE — Progress Notes (Signed)
 "  PRENATAL VISIT NOTE  Subjective:  Molly Rose is a 24 y.o. G2P1001 at [redacted]w[redacted]d being seen today for ongoing prenatal care.  She is currently monitored for the following issues for this high-risk pregnancy and has Episodic tension-type headache; Carrier of spinal muscular atrophy; History of cholestasis during pregnancy; Supervision of other normal pregnancy, antepartum; UTI in pregnancy, antepartum, second trimester; and History of severe pre-eclampsia on their problem list.  Patient reports no itching.  Contractions: Not present. Vag. Bleeding: None.  Movement: Present. Denies leaking of fluid.   The following portions of the patient's history were reviewed and updated as appropriate: allergies, current medications, past family history, past medical history, past social history, past surgical history and problem list.   Objective:   Vitals:   06/09/24 1357  BP: 100/61  Pulse: 89  Weight: 155 lb 1.6 oz (70.4 kg)    Fetal Status:  Fetal Heart Rate (bpm): 144   Movement: Present    General: Alert, oriented and cooperative. Patient is in no acute distress.  Skin: Skin is warm and dry. No rash noted.   Cardiovascular: Normal heart rate noted  Respiratory: Normal respiratory effort, no problems with respiration noted  Abdomen: Soft, gravid, appropriate for gestational age.  Pain/Pressure: Absent     Pelvic: Cervical exam deferred        Extremities: Normal range of motion.  Edema: None  Mental Status: Normal mood and affect. Normal behavior. Normal judgment and thought content.      01/14/2024   10:46 AM 12/17/2021    1:37 PM 10/23/2021    9:32 AM  Depression screen PHQ 2/9  Decreased Interest 0 0 2  Down, Depressed, Hopeless 0 0 0  PHQ - 2 Score 0 0 2  Altered sleeping 0 0 1  Tired, decreased energy 0 1 2  Change in appetite 0 0 0  Feeling bad or failure about yourself  0 0 0  Trouble concentrating 0 0 0  Moving slowly or fidgety/restless 0 0 0  Suicidal thoughts 0 0 0   PHQ-9 Score 0  1  5   Difficult doing work/chores  Not difficult at all      Data saved with a previous flowsheet row definition        01/14/2024   10:47 AM 10/23/2021    9:32 AM 06/22/2021    9:32 AM  GAD 7 : Generalized Anxiety Score  Nervous, Anxious, on Edge 0 0 0  Control/stop worrying 0 0 0  Worry too much - different things 0 0 0  Trouble relaxing 0 0 0  Restless 0 0 0  Easily annoyed or irritable 0 0 0  Afraid - awful might happen 0 1 0  Total GAD 7 Score 0 1 0  Anxiety Difficulty   Not difficult at all    Assessment and Plan:  Pregnancy: G2P1001 at [redacted]w[redacted]d 1. History of severe pre-eclampsia (Primary) Nl BP  2. Supervision of other normal pregnancy, antepartum   3. History of cholestasis during pregnancy No s/sx   4. [redacted] weeks gestation of pregnancy [redacted]w[redacted]d   Preterm labor symptoms and general obstetric precautions including but not limited to vaginal bleeding, contractions, leaking of fluid and fetal movement were reviewed in detail with the patient. Please refer to After Visit Summary for other counseling recommendations.   Return in about 2 weeks (around 06/23/2024).  Future Appointments  Date Time Provider Department Center  06/09/2024  3:15 PM [facility] PROVIDER 1 [facility]  06/09/2024  3:30 PM [facility] US4 [facility]  06/24/2024  2:50 PM Nicholaus Burnard HERO, MD [facility] None    Lynwood Solomons, MD  "

## 2024-06-14 ENCOUNTER — Encounter: Payer: Self-pay | Admitting: Advanced Practice Midwife

## 2024-06-18 ENCOUNTER — Telehealth: Admitting: Physician Assistant

## 2024-06-18 DIAGNOSIS — Z3A29 29 weeks gestation of pregnancy: Secondary | ICD-10-CM | POA: Diagnosis not present

## 2024-06-18 DIAGNOSIS — R12 Heartburn: Secondary | ICD-10-CM

## 2024-06-18 DIAGNOSIS — O26899 Other specified pregnancy related conditions, unspecified trimester: Secondary | ICD-10-CM

## 2024-06-18 MED ORDER — FAMOTIDINE 20 MG PO TABS: 20.0000 mg | ORAL_TABLET | Freq: Two times a day (BID) | ORAL | 0 refills | Status: AC

## 2024-06-18 NOTE — Patient Instructions (Signed)
 " Molly Rose, thank you for joining [name], PA-C for today's virtual visit.  While this provider is not your primary care provider (PCP), if your PCP is located in our provider database this encounter information will be shared with them immediately following your visit.   A [facility] MyChart account gives you access to today's visit and all your visits, tests, and labs performed at [facility]  click here if you don't have a [facility] MyChart account or go to [email or web address]  Consent: (Patient) Molly Rose provided verbal consent for this virtual visit at the beginning of the encounter.  Current Medications:  Current Outpatient Medications:    Blood Pressure Monitoring (BLOOD PRESSURE KIT) DEVI, 1 Device by Does not apply route once a week. (Patient not taking: Reported on 05/06/2024), Disp: 1 each, Rfl: 0   Prenatal Vit-Fe Fumarate-FA (PREPLUS) 27-1 MG TABS, Take 1 tablet by mouth daily., Disp: 30 tablet, Rfl: 13   ursodiol  (ACTIGALL ) 300 MG capsule, Take 1 capsule (300 mg total) by mouth 2 (two) times daily., Disp: 60 capsule, Rfl: 5   Medications ordered in this encounter:  No orders of the defined types were placed in this encounter.    *If you need refills on other medications prior to your next appointment, please contact your pharmacy*  Follow-Up: Call back or seek an in-person evaluation if the symptoms worsen or if the condition fails to improve as anticipated.  [facility] [phone]  Other Instructions Please stay hydrated. Follow dietary recommendations below. Take the prescribed medication as directed. Follow-up with OB next week as scheduled. If you note any non-resolving, new, or worsening symptoms despite treatment, please seek an in-person evaluation ASAP.   Heartburn During Pregnancy Heartburn is a type of pain or discomfort that can happen in the throat or chest. It is often described as a  burning sensation. Heartburn is common during pregnancy because: Progesterone, a hormone that is released during pregnancy, may relax the valve that separates the esophagus from the stomach (lower esophageal sphincter, or LES). This allows stomach acid to move up into the esophagus, causing heartburn. The uterus gets larger and pushes up on the stomach, which pushes more acid into the esophagus. This is especially true in the later stages of pregnancy. Heartburn usually goes away or gets better after giving birth. What are the causes? This condition is caused by stomach acid backing up into the esophagus (reflux). Reflux can be triggered by: Changing hormone levels during pregnancy. Large meals. Certain foods and beverages. Increased acid in the stomach. What increases the risk? You are more likely to develop this condition if: You had heartburn before pregnancy. You have had at least two previous pregnancies. You are overweight or obese. Risk for heartburn increases as your baby grows and stretches your uterus. This often happens in the last trimester of pregnancy when the baby gains the most weight. What are the signs or symptoms? Symptoms of this condition include: Burning pain in the chest or lower throat. A bitter taste in the mouth. Coughing. Problems swallowing. Vomiting. A hoarse voice. Asthma. Symptoms may get worse when you lie down or bend over. Symptoms are often worse at night. How is this diagnosed? This condition is diagnosed based on: Your medical history. Your symptoms. A decrease or relief of symptoms when taking heartburn medicine or making lifestyle changes. A procedure to view the stomach and esophagus using a tube that has a light and camera (endoscopy). How  is this treated? Treatment for this condition depends on how severe your symptoms are. Your health care provider may recommend: Over-the-counter medicines for mild heartburn, such as antacids or acid  reducers. Prescription medicines to decrease stomach acid or to protect your stomach lining. Changes in your diet, such as smaller meals eaten more often. Raising the head of your bed higher than the foot of the bed. This helps stop stomach acid from backing up into the esophagus when you are lying down. Follow these instructions at home: Eating and drinking Do not drink alcohol during your pregnancy. Identify foods and beverages that make your symptoms worse and avoid them. Avoid drinking large amounts of liquid with your meals. Avoid eating 2-3 hours before bedtime. Avoid lying down for at least 1 hour after you eat. Do not exercise right after you eat. Beverages to avoid Coffee and tea, with or without caffeine. Energy and sports drinks. Carbonated drinks or sodas. Citrus fruit juices. Foods to avoid Spicy or acidic food, such as: Peppers, chili powder, curry powder, vinegar, hot sauces, and barbecue sauce. Citrus fruits, such as oranges, lemons, and limes. Tomato-based foods, such as red sauce, chili, and salsa. High-fat food, such as: Hot dogs, precooked or cured meat, sausage, ham, and bacon. Whole milk, butter, and cheese. Fried and fatty foods, such as donuts, french fries, potato chips, and high-fat dressings. Chocolate and cocoa. Mint. Medicines Take over-the-counter and prescription medicines only as told by your health care provider. Do not take aspirin  or NSAIDs, such as ibuprofen , unless your health care provider tells you to. You may be instructed to avoid medicines that contain sodium bicarbonate. General instructions If directed, raise the head of your bed about 6 inches (15 cm) by putting blocks under the legs. Sleeping with more pillows is not effective because it only changes the position of your head. Do not use any products that contain nicotine or tobacco. These products include cigarettes, chewing tobacco, and vaping devices, such as e-cigarettes. If you need  help quitting, ask your health care provider. Wear loose-fitting clothing. Try to reduce your stress with yoga or meditation. If you need help managing stress, ask your health care provider. Maintain a healthy weight. If you are overweight, work with your health care provider to safely manage your weight. Where to find more information American Pregnancy Association: americanpregnancy.org Contact a health care provider if: Your symptoms last for 2 weeks or more, or you develop new symptoms. You do not improve with treatment. You have unexplained weight loss. You have difficulty swallowing. You make loud sounds when you breathe (wheeze). You have a cough that does not go away. You have nausea or vomiting that does not get better with treatment. You have pain in your abdomen. Your stool is bloody or black. You have pain when swallowing. Get help right away if: You have severe chest pain that spreads to your arm, neck, or jaw. You feel sweaty, dizzy, or light-headed. You have shortness of breath. You vomit, and your vomit looks like blood or coffee grounds. These symptoms may be an emergency. Get help right away. Call 911. Do not wait to see if the symptoms will go away. Do not drive yourself to the hospital. This information is not intended to replace advice given to you by your health care provider. Make sure you discuss any questions you have with your health care provider. Document Revised: 10/23/2021 Document Reviewed: 10/23/2021 Elsevier Patient Education  2024 [employer].   If you have been  instructed to have an in-person evaluation today at a local Urgent Care facility, please use the link below. It will take you to a list of all of our available [facility], including address, phone number and hours of operation. Please do not delay care.  [facility]  If you or a family member do not have a primary care provider, use the link below to schedule a  visit and establish care. When you choose a [facility] primary care physician or advanced practice provider, you gain a long-term partner in health. Find a Primary Care Provider  Learn more about [facility] in-office and virtual care options: [facility] - Get Care Now  "

## 2024-06-18 NOTE — Progress Notes (Signed)
 " Virtual Visit Consent   Molly Rose, you are scheduled for a virtual visit with a [facility] provider today. Just as with appointments in the office, your consent must be obtained to participate. Your consent will be active for this visit and any virtual visit you may have with one of our providers in the next 365 days. If you have a MyChart account, a copy of this consent can be sent to you electronically.  As this is a virtual visit, video technology does not allow for your provider to perform a traditional examination. This may limit your provider's ability to fully assess your condition. If your provider identifies any concerns that need to be evaluated in person or the need to arrange testing (such as labs, EKG, etc.), we will make arrangements to do so. Although advances in technology are sophisticated, we cannot ensure that it will always work on either your end or our end. If the connection with a video visit is poor, the visit may have to be switched to a telephone visit. With either a video or telephone visit, we are not always able to ensure that we have a secure connection.  By engaging in this virtual visit, you consent to the provision of healthcare and authorize for your insurance to be billed (if applicable) for the services provided during this visit. Depending on your insurance coverage, you may receive a charge related to this service.  I need to obtain your verbal consent now. Are you willing to proceed with your visit today? Jacquita J Rose has provided verbal consent on 06/18/2024 for a virtual visit (video or telephone). Molly Rose, [place]  Date: 06/18/2024 1:43 PM   Virtual Visit via Video Note   I, Molly Rose, connected with  Molly Rose  ([id number], 06-21-2000) on 06/18/24 at  1:30 PM EST by a video-enabled telemedicine application and verified that I am speaking with the correct person using two identifiers.  Location: Patient: Virtual  Visit Location Patient: Home Provider: Virtual Visit Location Provider: Home Office   I discussed the limitations of evaluation and management by telemedicine and the availability of in person appointments. The patient expressed understanding and agreed to proceed.    History of Present Illness: Molly Rose is a 24 y.o. who identifies as a female who was assigned female at birth, and is being seen today for increased heartburn over the past week. Prior to that has been very intermittent during her current pregnancy. Is trying to monitor what she is eating and avoid trigger foods, but noted after eating regardless noting the heartburn and bloating. Notes has been taking OTC TUMS, increasing water intake, etc without much improvement.  Notes sometimes will cause mild shortness of breath. Has not spoken to her OBGYN about this. Next appointment is scheduled for Thursday of next way.  Some looser caliber stool in past week without true diarrhea. Denies vomiting.   HPI: HPI  Problems:  Patient Active Problem List   Diagnosis Date Noted   History of severe pre-eclampsia 03/10/2024   UTI in pregnancy, antepartum, second trimester 03/02/2024   Supervision of other normal pregnancy, antepartum 01/14/2024   History of cholestasis during pregnancy 02/06/2022   Carrier of spinal muscular atrophy 07/14/2021   Episodic tension-type headache 09/18/2015    Allergies: Allergies[1] Medications: Current Medications[2]  Observations/Objective: Patient is well-developed, well-nourished in no acute distress.  Resting comfortably at home.  Head is normocephalic, atraumatic.  No labored breathing. Speech is clear and  coherent with logical content.  Patient is alert and oriented at baseline.   Assessment and Plan: 1. Heartburn during pregnancy, antepartum (Primary) - famotidine  (PEPCID ) 20 MG tablet; Take 1 tablet (20 mg total) by mouth 2 (two) times daily.  Dispense: 20 tablet; Refill: 0  No alarm  signs/symptoms. Breakthrough with being mindful of diet and use of TUMS. Will start Famotidine  course. Increase fluids. Continue GERD diet. Follow-up with OBGYN next week as scheduled. MAU precautions reviewed.  Follow Up Instructions: I discussed the assessment and treatment plan with the patient. The patient was provided an opportunity to ask questions and all were answered. The patient agreed with the plan and demonstrated an understanding of the instructions.  A copy of instructions were sent to the patient via MyChart unless otherwise noted below.   The patient was advised to call back or seek an in-person evaluation if the symptoms worsen or if the condition fails to improve as anticipated.    Molly Velma Lunger, PA-C    [1]  Allergies Allergen Reactions   Rocephin  [Ceftriaxone ] Anaphylaxis    See MAU note from 07/25/2021. Hives on face, anaphylaxis reaction with drop in O2 saturation.    Other     Seasonal Allergies   Shellfish Allergy   [2]  Current Outpatient Medications:    famotidine  (PEPCID ) 20 MG tablet, Take 1 tablet (20 mg total) by mouth 2 (two) times daily., Disp: 20 tablet, Rfl: 0   Blood Pressure Monitoring (BLOOD PRESSURE KIT) DEVI, 1 Device by Does not apply route once a week. (Patient not taking: Reported on 05/06/2024), Disp: 1 each, Rfl: 0   Prenatal Vit-Fe Fumarate-FA (PREPLUS) 27-1 MG TABS, Take 1 tablet by mouth daily., Disp: 30 tablet, Rfl: 13   ursodiol  (ACTIGALL ) 300 MG capsule, Take 1 capsule (300 mg total) by mouth 2 (two) times daily., Disp: 60 capsule, Rfl: 5  "

## 2024-06-24 ENCOUNTER — Ambulatory Visit: Payer: Self-pay | Admitting: Obstetrics and Gynecology

## 2024-06-24 ENCOUNTER — Encounter: Payer: Self-pay | Admitting: Obstetrics and Gynecology

## 2024-06-24 VITALS — BP 99/59 | HR 82 | Wt 157.2 lb

## 2024-06-24 DIAGNOSIS — L299 Pruritus, unspecified: Secondary | ICD-10-CM | POA: Diagnosis not present

## 2024-06-24 DIAGNOSIS — Z8719 Personal history of other diseases of the digestive system: Secondary | ICD-10-CM | POA: Diagnosis not present

## 2024-06-24 DIAGNOSIS — O368130 Decreased fetal movements, third trimester, not applicable or unspecified: Secondary | ICD-10-CM | POA: Diagnosis not present

## 2024-06-24 DIAGNOSIS — Z148 Genetic carrier of other disease: Secondary | ICD-10-CM | POA: Diagnosis not present

## 2024-06-24 DIAGNOSIS — Z3A30 30 weeks gestation of pregnancy: Secondary | ICD-10-CM

## 2024-06-24 DIAGNOSIS — Z3480 Encounter for supervision of other normal pregnancy, unspecified trimester: Secondary | ICD-10-CM

## 2024-06-24 DIAGNOSIS — Z8759 Personal history of other complications of pregnancy, childbirth and the puerperium: Secondary | ICD-10-CM | POA: Diagnosis not present

## 2024-06-24 NOTE — Progress Notes (Signed)
 Pt presents for ROB visit. Pt c/o lower abd pain, low back pain that radiates to the feet and a itchy rash on the belly. Pt has concerns about fetal movement. She states baby is more active at certain times of the day.

## 2024-06-24 NOTE — Progress Notes (Signed)
 "  PRENATAL VISIT NOTE  Subjective:  Molly Rose is a 24 y.o. G2P1001 at [redacted]w[redacted]d being seen today for ongoing prenatal care.  She is currently monitored for the following issues for this high-risk pregnancy and has Episodic tension-type headache; Carrier of spinal muscular atrophy; History of cholestasis during pregnancy; Supervision of other normal pregnancy, antepartum; UTI in pregnancy, antepartum, second trimester; and History of severe pre-eclampsia on their problem list.  Patient reports baby is moving less than he has in the past, she has times when he doesn't move at all.Even when she does kick counts, he doesn't move as much. Does have some itching on stomach.  Contractions: Not present. Vag. Bleeding: None.  Movement: Present. Denies leaking of fluid.   The following portions of the patient's history were reviewed and updated as appropriate: allergies, current medications, past family history, past medical history, past social history, past surgical history and problem list.   Objective:   Vitals:   06/24/24 1518  BP: (!) 99/59  Pulse: 82  Weight: 157 lb 3.2 oz (71.3 kg)    Fetal Status:  Fetal Heart Rate (bpm): 134   Movement: Present    General: Alert, oriented and cooperative. Patient is in no acute distress.  Skin: Skin is warm and dry. No rash noted.   Cardiovascular: Normal heart rate noted  Respiratory: Normal respiratory effort, no problems with respiration noted  Abdomen: Soft, gravid, appropriate for gestational age.  Pain/Pressure: Present     Pelvic: Cervical exam deferred        Extremities: Normal range of motion.  Edema: Trace  Mental Status: Normal mood and affect. Normal behavior. Normal judgment and thought content.      01/14/2024   10:46 AM 12/17/2021    1:37 PM 10/23/2021    9:32 AM  Depression screen PHQ 2/9  Decreased Interest 0 0 2  Down, Depressed, Hopeless 0 0 0  PHQ - 2 Score 0 0 2  Altered sleeping 0 0 1  Tired, decreased energy 0 1 2   Change in appetite 0 0 0  Feeling bad or failure about yourself  0 0 0  Trouble concentrating 0 0 0  Moving slowly or fidgety/restless 0 0 0  Suicidal thoughts 0 0 0  PHQ-9 Score 0  1  5   Difficult doing work/chores  Not difficult at all      Data saved with a previous flowsheet row definition        01/14/2024   10:47 AM 10/23/2021    9:32 AM 06/22/2021    9:32 AM  GAD 7 : Generalized Anxiety Score  Nervous, Anxious, on Edge 0  0  0   Control/stop worrying 0  0  0   Worry too much - different things 0  0  0   Trouble relaxing 0  0  0   Restless 0  0  0   Easily annoyed or irritable 0  0  0   Afraid - awful might happen 0  1  0   Total GAD 7 Score 0 1 0  Anxiety Difficulty   Not difficult at all     Data saved with a previous flowsheet row definition    Assessment and Plan:  Pregnancy: G2P1001 at [redacted]w[redacted]d  1. Supervision of other normal pregnancy, antepartum (Primary)  2. History of severe pre-eclampsia  3. History of cholestasis during pregnancy  4. Carrier of spinal muscular atrophy  5. [redacted] weeks gestation of pregnancy  6. Decreased fetal movements in third trimester, single or unspecified fetus NST reactive Reviewed kick counts, reasons to present to MAU Low threshold to seek assistance if fetus not moving normally  7. Itching Pt previously had itching and was ruled out for cholestasis but started on atigall, reports itching is improved on actigall  but has new itching on stomach today -CMP/bile acids   Preterm labor symptoms and general obstetric precautions including but not limited to vaginal bleeding, contractions, leaking of fluid and fetal movement were reviewed in detail with the patient. Please refer to After Visit Summary for other counseling recommendations.   Return in about 2 weeks (around 07/08/2024) for high OB.  Future Appointments  Date Time Provider Department Center  06/25/2024 11:15 AM [facility] None  07/08/2024  4:10 PM Eveline Lynwood MATSU, MD [facility] None    Burnard CHRISTELLA Moats, MD  "

## 2024-06-25 ENCOUNTER — Other Ambulatory Visit: Payer: Self-pay

## 2024-07-08 ENCOUNTER — Encounter: Payer: Self-pay | Admitting: Obstetrics & Gynecology
